# Patient Record
Sex: Female | Born: 1935 | Race: White | Hispanic: No | Marital: Married | Smoking: Former smoker
Health system: Southern US, Community
[De-identification: ages and names within clinical notes are randomized; demographics above are authoritative.]

## PROBLEM LIST (undated history)

## (undated) DIAGNOSIS — F329 Major depressive disorder, single episode, unspecified: Secondary | ICD-10-CM

## (undated) DIAGNOSIS — I10 Essential (primary) hypertension: Secondary | ICD-10-CM

## (undated) DIAGNOSIS — F32A Depression, unspecified: Secondary | ICD-10-CM

## (undated) DIAGNOSIS — E871 Hypo-osmolality and hyponatremia: Secondary | ICD-10-CM

## (undated) DIAGNOSIS — Z87440 Personal history of urinary (tract) infections: Secondary | ICD-10-CM

## (undated) DIAGNOSIS — C4491 Basal cell carcinoma of skin, unspecified: Secondary | ICD-10-CM

## (undated) DIAGNOSIS — S62009A Unspecified fracture of navicular [scaphoid] bone of unspecified wrist, initial encounter for closed fracture: Secondary | ICD-10-CM

## (undated) DIAGNOSIS — K5790 Diverticulosis of intestine, part unspecified, without perforation or abscess without bleeding: Secondary | ICD-10-CM

## (undated) DIAGNOSIS — IMO0002 Reserved for concepts with insufficient information to code with codable children: Secondary | ICD-10-CM

## (undated) DIAGNOSIS — I4891 Unspecified atrial fibrillation: Secondary | ICD-10-CM

## (undated) DIAGNOSIS — I499 Cardiac arrhythmia, unspecified: Secondary | ICD-10-CM

## (undated) DIAGNOSIS — E041 Nontoxic single thyroid nodule: Secondary | ICD-10-CM

## (undated) DIAGNOSIS — L719 Rosacea, unspecified: Secondary | ICD-10-CM

## (undated) DIAGNOSIS — F0280 Dementia in other diseases classified elsewhere without behavioral disturbance: Secondary | ICD-10-CM

## (undated) DIAGNOSIS — G309 Alzheimer's disease, unspecified: Secondary | ICD-10-CM

## (undated) DIAGNOSIS — M8580 Other specified disorders of bone density and structure, unspecified site: Secondary | ICD-10-CM

## (undated) DIAGNOSIS — H269 Unspecified cataract: Secondary | ICD-10-CM

## (undated) DIAGNOSIS — L570 Actinic keratosis: Secondary | ICD-10-CM

## (undated) DIAGNOSIS — Z7901 Long term (current) use of anticoagulants: Secondary | ICD-10-CM

## (undated) DIAGNOSIS — E785 Hyperlipidemia, unspecified: Secondary | ICD-10-CM

## (undated) DIAGNOSIS — T7840XA Allergy, unspecified, initial encounter: Secondary | ICD-10-CM

## (undated) DIAGNOSIS — G3184 Mild cognitive impairment, so stated: Secondary | ICD-10-CM

## (undated) DIAGNOSIS — F419 Anxiety disorder, unspecified: Secondary | ICD-10-CM

## (undated) HISTORY — DX: Anxiety disorder, unspecified: F41.9

## (undated) HISTORY — DX: Long term (current) use of anticoagulants: Z79.01

## (undated) HISTORY — DX: Hypo-osmolality and hyponatremia: E87.1

## (undated) HISTORY — DX: Actinic keratosis: L57.0

## (undated) HISTORY — DX: Unspecified atrial fibrillation: I48.91

## (undated) HISTORY — DX: Unspecified cataract: H26.9

## (undated) HISTORY — DX: Cardiac arrhythmia, unspecified: I49.9

## (undated) HISTORY — DX: Diverticulosis of intestine, part unspecified, without perforation or abscess without bleeding: K57.90

## (undated) HISTORY — DX: Depression, unspecified: F32.A

## (undated) HISTORY — DX: Alzheimer's disease, unspecified: G30.9

## (undated) HISTORY — DX: Unspecified fracture of navicular (scaphoid) bone of unspecified wrist, initial encounter for closed fracture: S62.009A

## (undated) HISTORY — DX: Reserved for concepts with insufficient information to code with codable children: IMO0002

## (undated) HISTORY — DX: Basal cell carcinoma of skin, unspecified: C44.91

## (undated) HISTORY — DX: Rosacea, unspecified: L71.9

## (undated) HISTORY — DX: Allergy, unspecified, initial encounter: T78.40XA

## (undated) HISTORY — DX: Mild cognitive impairment of uncertain or unknown etiology: G31.84

## (undated) HISTORY — DX: Personal history of urinary (tract) infections: Z87.440

## (undated) HISTORY — DX: Dementia in other diseases classified elsewhere without behavioral disturbance: F02.80

## (undated) HISTORY — DX: Nontoxic single thyroid nodule: E04.1

## (undated) HISTORY — PX: OTHER SURGICAL HISTORY: SHX169

## (undated) HISTORY — DX: Other specified disorders of bone density and structure, unspecified site: M85.80

## (undated) HISTORY — DX: Major depressive disorder, single episode, unspecified: F32.9

## (undated) HISTORY — DX: Hyperlipidemia, unspecified: E78.5

---

## 1968-11-21 HISTORY — PX: ABDOMINAL HYSTERECTOMY: SHX81

## 1999-07-21 ENCOUNTER — Ambulatory Visit (HOSPITAL_COMMUNITY): Admission: RE | Admit: 1999-07-21 | Discharge: 1999-07-21 | Payer: Self-pay | Admitting: *Deleted

## 2001-07-21 ENCOUNTER — Emergency Department (HOSPITAL_COMMUNITY): Admission: EM | Admit: 2001-07-21 | Discharge: 2001-07-21 | Payer: Self-pay | Admitting: Emergency Medicine

## 2001-10-16 ENCOUNTER — Ambulatory Visit (HOSPITAL_COMMUNITY): Admission: RE | Admit: 2001-10-16 | Discharge: 2001-10-16 | Payer: Self-pay | Admitting: *Deleted

## 2002-12-05 ENCOUNTER — Encounter: Admission: RE | Admit: 2002-12-05 | Discharge: 2002-12-05 | Payer: Self-pay | Admitting: Internal Medicine

## 2002-12-05 ENCOUNTER — Encounter: Payer: Self-pay | Admitting: Internal Medicine

## 2003-09-18 ENCOUNTER — Encounter: Admission: RE | Admit: 2003-09-18 | Discharge: 2003-09-18 | Payer: Self-pay | Admitting: *Deleted

## 2003-12-18 ENCOUNTER — Ambulatory Visit (HOSPITAL_COMMUNITY): Admission: RE | Admit: 2003-12-18 | Discharge: 2003-12-18 | Payer: Self-pay | Admitting: Internal Medicine

## 2004-08-05 DIAGNOSIS — S62009A Unspecified fracture of navicular [scaphoid] bone of unspecified wrist, initial encounter for closed fracture: Secondary | ICD-10-CM

## 2004-08-05 HISTORY — DX: Unspecified fracture of navicular (scaphoid) bone of unspecified wrist, initial encounter for closed fracture: S62.009A

## 2004-09-01 ENCOUNTER — Encounter: Admission: RE | Admit: 2004-09-01 | Discharge: 2004-09-01 | Payer: Self-pay | Admitting: Orthopedic Surgery

## 2004-09-08 ENCOUNTER — Encounter: Admission: RE | Admit: 2004-09-08 | Discharge: 2004-09-08 | Payer: Self-pay | Admitting: Orthopedic Surgery

## 2005-03-22 ENCOUNTER — Ambulatory Visit (HOSPITAL_COMMUNITY): Admission: RE | Admit: 2005-03-22 | Discharge: 2005-03-22 | Payer: Self-pay | Admitting: Internal Medicine

## 2005-06-29 ENCOUNTER — Ambulatory Visit: Payer: Self-pay | Admitting: Gastroenterology

## 2005-07-07 ENCOUNTER — Ambulatory Visit: Payer: Self-pay | Admitting: Gastroenterology

## 2006-05-02 ENCOUNTER — Ambulatory Visit (HOSPITAL_COMMUNITY): Admission: RE | Admit: 2006-05-02 | Discharge: 2006-05-02 | Payer: Self-pay | Admitting: Internal Medicine

## 2006-11-21 DIAGNOSIS — K5790 Diverticulosis of intestine, part unspecified, without perforation or abscess without bleeding: Secondary | ICD-10-CM

## 2006-11-21 HISTORY — DX: Diverticulosis of intestine, part unspecified, without perforation or abscess without bleeding: K57.90

## 2007-07-12 ENCOUNTER — Ambulatory Visit (HOSPITAL_COMMUNITY): Admission: RE | Admit: 2007-07-12 | Discharge: 2007-07-12 | Payer: Self-pay | Admitting: *Deleted

## 2007-11-21 ENCOUNTER — Encounter: Admission: RE | Admit: 2007-11-21 | Discharge: 2007-11-21 | Payer: Self-pay | Admitting: Family Medicine

## 2008-09-11 ENCOUNTER — Ambulatory Visit (HOSPITAL_COMMUNITY): Admission: RE | Admit: 2008-09-11 | Discharge: 2008-09-11 | Payer: Self-pay | Admitting: *Deleted

## 2010-02-02 ENCOUNTER — Ambulatory Visit (HOSPITAL_COMMUNITY): Admission: RE | Admit: 2010-02-02 | Discharge: 2010-02-02 | Payer: Self-pay | Admitting: *Deleted

## 2010-03-02 ENCOUNTER — Emergency Department (HOSPITAL_COMMUNITY): Admission: EM | Admit: 2010-03-02 | Discharge: 2010-03-03 | Payer: Self-pay | Admitting: Emergency Medicine

## 2010-03-12 ENCOUNTER — Encounter: Admission: RE | Admit: 2010-03-12 | Discharge: 2010-03-12 | Payer: Self-pay | Admitting: Family Medicine

## 2010-05-11 ENCOUNTER — Encounter (INDEPENDENT_AMBULATORY_CARE_PROVIDER_SITE_OTHER): Payer: Self-pay | Admitting: *Deleted

## 2010-11-05 ENCOUNTER — Encounter
Admission: RE | Admit: 2010-11-05 | Discharge: 2010-11-05 | Payer: Self-pay | Source: Home / Self Care | Attending: Family Medicine | Admitting: Family Medicine

## 2010-11-08 ENCOUNTER — Encounter
Admission: RE | Admit: 2010-11-08 | Discharge: 2010-11-08 | Payer: Self-pay | Source: Home / Self Care | Attending: Family Medicine | Admitting: Family Medicine

## 2010-11-26 ENCOUNTER — Encounter
Admission: RE | Admit: 2010-11-26 | Discharge: 2010-11-26 | Payer: Self-pay | Source: Home / Self Care | Attending: Family Medicine | Admitting: Family Medicine

## 2010-12-12 ENCOUNTER — Encounter: Payer: Self-pay | Admitting: Family Medicine

## 2010-12-16 ENCOUNTER — Other Ambulatory Visit: Payer: Self-pay | Admitting: Internal Medicine

## 2010-12-16 ENCOUNTER — Encounter
Admission: RE | Admit: 2010-12-16 | Discharge: 2010-12-16 | Payer: Self-pay | Source: Home / Self Care | Attending: Internal Medicine | Admitting: Internal Medicine

## 2010-12-16 DIAGNOSIS — R413 Other amnesia: Secondary | ICD-10-CM

## 2010-12-21 NOTE — Letter (Signed)
Summary: Colonoscopy-Changed to Office Visit Letter  [facility]  [address] [place], [place] [address]   Phone: [phone]  Fax: [phone]      May 11, 2010 MRN: [id number]   [facility] [address] [place], [place]  [address]   Dear Ms. Pacetti,   According to our records, it is time for you to schedule a Colonoscopy. However, after reviewing your medical record, I feel that an office visit would be most appropriate to more completely evaluate you and determine your need for a repeat procedure.  Please call [phone] (option #2) at your convenience to schedule an office visit. If you have any questions, concerns, or feel that this letter is in error, we would appreciate your call.   Sincerely,  Hedwig Morton. Juanda Chance, M.D.  [facility] [phone]

## 2010-12-31 ENCOUNTER — Ambulatory Visit
Admission: RE | Admit: 2010-12-31 | Discharge: 2010-12-31 | Disposition: A | Discharge status: Home / Self Care | Payer: Medicare Other | Source: Ambulatory Visit | Attending: Internal Medicine | Admitting: Internal Medicine

## 2010-12-31 DIAGNOSIS — R413 Other amnesia: Secondary | ICD-10-CM

## 2010-12-31 MED ORDER — GADOBENATE DIMEGLUMINE 529 MG/ML IV SOLN
12.0000 mL | Freq: Once | INTRAVENOUS | Status: AC | PRN
  Administered 2010-12-31: 12 mL via INTRAVENOUS

## 2011-02-09 LAB — DIFFERENTIAL
Basophils Absolute: 0.1 10*3/uL (ref 0.0–0.1)
Eosinophils Absolute: 0 10*3/uL (ref 0.0–0.7)
Eosinophils Relative: 1 % (ref 0–5)
Lymphocytes Relative: 21 % (ref 12–46)
Lymphs Abs: 2 10*3/uL (ref 0.7–4.0)
Monocytes Absolute: 0.5 10*3/uL (ref 0.1–1.0)

## 2011-02-09 LAB — COMPREHENSIVE METABOLIC PANEL
ALT: 21 U/L (ref 0–35)
AST: 27 U/L (ref 0–37)
Albumin: 4 g/dL (ref 3.5–5.2)
CO2: 28 mEq/L (ref 19–32)
Chloride: 97 mEq/L (ref 96–112)
Creatinine, Ser: 0.83 mg/dL (ref 0.4–1.2)
GFR calc Af Amer: >60 mL/min (ref >60)
GFR calc non Af Amer: >60 mL/min (ref >60)
Sodium: 131 mEq/L — ABNORMAL LOW (ref 135–145)
Total Bilirubin: 0.8 mg/dL (ref 0.3–1.2)

## 2011-02-09 LAB — URINALYSIS, ROUTINE W REFLEX MICROSCOPIC
Nitrite: NEGATIVE
Protein, ur: NEGATIVE mg/dL
Specific Gravity, Urine: 1.009 (ref 1.005–1.030)
Urobilinogen, UA: 0.2 mg/dL (ref 0.0–1.0)

## 2011-02-09 LAB — CBC
MCV: 93.3 fL (ref 78.0–100.0)
Platelets: 230 10*3/uL (ref 150–400)
RBC: 4.5 MIL/uL (ref 3.87–5.11)
WBC: 9.4 10*3/uL (ref 4.0–10.5)

## 2011-03-30 ENCOUNTER — Other Ambulatory Visit: Payer: Self-pay | Admitting: Internal Medicine

## 2011-03-30 DIAGNOSIS — E042 Nontoxic multinodular goiter: Secondary | ICD-10-CM

## 2011-03-31 ENCOUNTER — Other Ambulatory Visit: Payer: Self-pay | Admitting: Ophthalmology

## 2011-03-31 ENCOUNTER — Ambulatory Visit
Admission: RE | Admit: 2011-03-31 | Discharge: 2011-03-31 | Disposition: A | Discharge status: Home / Self Care | Payer: Medicare Other | Source: Ambulatory Visit | Attending: Ophthalmology | Admitting: Ophthalmology

## 2011-03-31 DIAGNOSIS — H532 Diplopia: Secondary | ICD-10-CM

## 2011-03-31 MED ORDER — IOHEXOL 300 MG/ML  SOLN
75.0000 mL | Freq: Once | INTRAMUSCULAR | Status: AC | PRN
  Administered 2011-03-31: 75 mL via INTRAVENOUS

## 2011-04-01 ENCOUNTER — Ambulatory Visit
Admission: RE | Admit: 2011-04-01 | Discharge: 2011-04-01 | Disposition: A | Discharge status: Home / Self Care | Payer: Medicare Other | Source: Ambulatory Visit | Attending: Internal Medicine | Admitting: Internal Medicine

## 2011-04-01 DIAGNOSIS — E042 Nontoxic multinodular goiter: Secondary | ICD-10-CM

## 2011-05-12 ENCOUNTER — Other Ambulatory Visit (HOSPITAL_COMMUNITY): Payer: Self-pay | Admitting: Family Medicine

## 2011-05-12 DIAGNOSIS — Z1231 Encounter for screening mammogram for malignant neoplasm of breast: Secondary | ICD-10-CM

## 2011-05-13 ENCOUNTER — Ambulatory Visit (HOSPITAL_COMMUNITY): Payer: Medicare Other

## 2011-05-23 ENCOUNTER — Ambulatory Visit (HOSPITAL_COMMUNITY): Payer: Medicare Other

## 2011-06-16 ENCOUNTER — Ambulatory Visit (HOSPITAL_COMMUNITY)
Admission: RE | Admit: 2011-06-16 | Discharge: 2011-06-16 | Disposition: A | Discharge status: Home / Self Care | Payer: Medicare Other | Source: Ambulatory Visit | Attending: Family Medicine | Admitting: Family Medicine

## 2011-06-16 DIAGNOSIS — Z1231 Encounter for screening mammogram for malignant neoplasm of breast: Secondary | ICD-10-CM | POA: Insufficient documentation

## 2011-12-02 DIAGNOSIS — L821 Other seborrheic keratosis: Secondary | ICD-10-CM | POA: Diagnosis not present

## 2011-12-02 DIAGNOSIS — D239 Other benign neoplasm of skin, unspecified: Secondary | ICD-10-CM | POA: Diagnosis not present

## 2011-12-02 DIAGNOSIS — L910 Hypertrophic scar: Secondary | ICD-10-CM | POA: Diagnosis not present

## 2011-12-23 DIAGNOSIS — H2640 Unspecified secondary cataract: Secondary | ICD-10-CM | POA: Diagnosis not present

## 2011-12-23 DIAGNOSIS — H52209 Unspecified astigmatism, unspecified eye: Secondary | ICD-10-CM | POA: Diagnosis not present

## 2011-12-30 DIAGNOSIS — N390 Urinary tract infection, site not specified: Secondary | ICD-10-CM | POA: Diagnosis not present

## 2011-12-30 DIAGNOSIS — R350 Frequency of micturition: Secondary | ICD-10-CM | POA: Diagnosis not present

## 2011-12-30 DIAGNOSIS — E871 Hypo-osmolality and hyponatremia: Secondary | ICD-10-CM | POA: Diagnosis not present

## 2012-01-20 DIAGNOSIS — Q828 Other specified congenital malformations of skin: Secondary | ICD-10-CM | POA: Diagnosis not present

## 2012-02-02 DIAGNOSIS — H26499 Other secondary cataract, unspecified eye: Secondary | ICD-10-CM | POA: Diagnosis not present

## 2012-02-02 DIAGNOSIS — H2640 Unspecified secondary cataract: Secondary | ICD-10-CM | POA: Diagnosis not present

## 2012-02-21 DIAGNOSIS — H02059 Trichiasis without entropian unspecified eye, unspecified eyelid: Secondary | ICD-10-CM | POA: Diagnosis not present

## 2012-02-23 DIAGNOSIS — H26499 Other secondary cataract, unspecified eye: Secondary | ICD-10-CM | POA: Diagnosis not present

## 2012-02-23 DIAGNOSIS — H2640 Unspecified secondary cataract: Secondary | ICD-10-CM | POA: Diagnosis not present

## 2012-03-28 DIAGNOSIS — E871 Hypo-osmolality and hyponatremia: Secondary | ICD-10-CM | POA: Diagnosis not present

## 2012-03-28 DIAGNOSIS — N390 Urinary tract infection, site not specified: Secondary | ICD-10-CM | POA: Diagnosis not present

## 2012-03-28 DIAGNOSIS — R5381 Other malaise: Secondary | ICD-10-CM | POA: Diagnosis not present

## 2012-03-28 DIAGNOSIS — E041 Nontoxic single thyroid nodule: Secondary | ICD-10-CM | POA: Diagnosis not present

## 2012-03-28 DIAGNOSIS — R350 Frequency of micturition: Secondary | ICD-10-CM | POA: Diagnosis not present

## 2012-03-28 DIAGNOSIS — R5383 Other fatigue: Secondary | ICD-10-CM | POA: Diagnosis not present

## 2012-04-13 ENCOUNTER — Other Ambulatory Visit: Payer: Self-pay | Admitting: Internal Medicine

## 2012-04-13 DIAGNOSIS — E049 Nontoxic goiter, unspecified: Secondary | ICD-10-CM

## 2012-04-23 ENCOUNTER — Ambulatory Visit
Admission: RE | Admit: 2012-04-23 | Discharge: 2012-04-23 | Disposition: A | Discharge status: Home / Self Care | Payer: Medicare Other | Source: Ambulatory Visit | Attending: Internal Medicine | Admitting: Internal Medicine

## 2012-04-23 DIAGNOSIS — E042 Nontoxic multinodular goiter: Secondary | ICD-10-CM | POA: Diagnosis not present

## 2012-04-23 DIAGNOSIS — E049 Nontoxic goiter, unspecified: Secondary | ICD-10-CM

## 2012-04-26 DIAGNOSIS — R1310 Dysphagia, unspecified: Secondary | ICD-10-CM | POA: Diagnosis not present

## 2012-04-26 DIAGNOSIS — Z8349 Family history of other endocrine, nutritional and metabolic diseases: Secondary | ICD-10-CM | POA: Diagnosis not present

## 2012-04-26 DIAGNOSIS — E042 Nontoxic multinodular goiter: Secondary | ICD-10-CM | POA: Diagnosis not present

## 2012-04-26 DIAGNOSIS — E871 Hypo-osmolality and hyponatremia: Secondary | ICD-10-CM | POA: Diagnosis not present

## 2012-04-26 DIAGNOSIS — R6889 Other general symptoms and signs: Secondary | ICD-10-CM | POA: Diagnosis not present

## 2012-05-11 DIAGNOSIS — Q828 Other specified congenital malformations of skin: Secondary | ICD-10-CM | POA: Diagnosis not present

## 2012-06-04 ENCOUNTER — Other Ambulatory Visit (HOSPITAL_COMMUNITY): Payer: Self-pay | Admitting: Family Medicine

## 2012-06-04 DIAGNOSIS — Z1231 Encounter for screening mammogram for malignant neoplasm of breast: Secondary | ICD-10-CM

## 2012-06-06 ENCOUNTER — Ambulatory Visit (HOSPITAL_COMMUNITY)
Admission: RE | Admit: 2012-06-06 | Discharge: 2012-06-06 | Disposition: A | Discharge status: Home / Self Care | Payer: Medicare Other | Source: Ambulatory Visit | Attending: Family Medicine | Admitting: Family Medicine

## 2012-06-06 DIAGNOSIS — Z1231 Encounter for screening mammogram for malignant neoplasm of breast: Secondary | ICD-10-CM | POA: Insufficient documentation

## 2012-09-07 DIAGNOSIS — Z23 Encounter for immunization: Secondary | ICD-10-CM | POA: Diagnosis not present

## 2012-10-13 DIAGNOSIS — J209 Acute bronchitis, unspecified: Secondary | ICD-10-CM | POA: Diagnosis not present

## 2012-12-14 DIAGNOSIS — Z85828 Personal history of other malignant neoplasm of skin: Secondary | ICD-10-CM | POA: Diagnosis not present

## 2012-12-14 DIAGNOSIS — C44621 Squamous cell carcinoma of skin of unspecified upper limb, including shoulder: Secondary | ICD-10-CM | POA: Diagnosis not present

## 2012-12-14 DIAGNOSIS — H61009 Unspecified perichondritis of external ear, unspecified ear: Secondary | ICD-10-CM | POA: Diagnosis not present

## 2012-12-14 DIAGNOSIS — L570 Actinic keratosis: Secondary | ICD-10-CM | POA: Diagnosis not present

## 2012-12-22 DIAGNOSIS — Z7901 Long term (current) use of anticoagulants: Secondary | ICD-10-CM | POA: Insufficient documentation

## 2012-12-22 DIAGNOSIS — I4891 Unspecified atrial fibrillation: Secondary | ICD-10-CM

## 2012-12-22 HISTORY — DX: Unspecified atrial fibrillation: I48.91

## 2012-12-22 HISTORY — DX: Long term (current) use of anticoagulants: Z79.01

## 2013-01-04 ENCOUNTER — Emergency Department (HOSPITAL_COMMUNITY): Payer: Medicare Other

## 2013-01-04 ENCOUNTER — Encounter (HOSPITAL_COMMUNITY): Payer: Self-pay | Admitting: *Deleted

## 2013-01-04 ENCOUNTER — Emergency Department (HOSPITAL_COMMUNITY)
Admission: EM | Admit: 2013-01-04 | Discharge: 2013-01-04 | Disposition: A | Discharge status: Home / Self Care | Payer: Medicare Other | Attending: Emergency Medicine | Admitting: Emergency Medicine

## 2013-01-04 DIAGNOSIS — I480 Paroxysmal atrial fibrillation: Secondary | ICD-10-CM

## 2013-01-04 DIAGNOSIS — I4891 Unspecified atrial fibrillation: Secondary | ICD-10-CM | POA: Diagnosis not present

## 2013-01-04 DIAGNOSIS — I10 Essential (primary) hypertension: Secondary | ICD-10-CM | POA: Insufficient documentation

## 2013-01-04 DIAGNOSIS — Z7982 Long term (current) use of aspirin: Secondary | ICD-10-CM | POA: Diagnosis not present

## 2013-01-04 DIAGNOSIS — Z79899 Other long term (current) drug therapy: Secondary | ICD-10-CM | POA: Diagnosis not present

## 2013-01-04 DIAGNOSIS — I499 Cardiac arrhythmia, unspecified: Secondary | ICD-10-CM | POA: Diagnosis not present

## 2013-01-04 HISTORY — DX: Essential (primary) hypertension: I10

## 2013-01-04 LAB — CBC WITH DIFFERENTIAL/PLATELET
HCT: 41.4 % (ref 36.0–46.0)
Lymphocytes Relative: 37 % (ref 12–46)
Lymphs Abs: 2 10*3/uL (ref 0.7–4.0)
MCHC: 34.3 g/dL (ref 30.0–36.0)
MCV: 88.7 fL (ref 78.0–100.0)
Monocytes Relative: 11 % (ref 3–12)
Neutro Abs: 2.3 10*3/uL (ref 1.7–7.7)
Neutrophils Relative %: 44 % (ref 43–77)
Platelets: 227 10*3/uL (ref 150–400)

## 2013-01-04 LAB — BASIC METABOLIC PANEL
BUN: 15 mg/dL (ref 6–23)
CO2: 26 mEq/L (ref 19–32)
Chloride: 95 mEq/L — ABNORMAL LOW (ref 96–112)
Creatinine, Ser: 0.83 mg/dL (ref 0.50–1.10)

## 2013-01-04 LAB — POCT I-STAT TROPONIN I: Troponin i, poc: 0.01 ng/mL (ref 0.00–0.08)

## 2013-01-04 MED ORDER — APIXABAN 2.5 MG PO TABS: 2.5000 mg | ORAL_TABLET | Freq: Two times a day (BID) | ORAL | Status: DC

## 2013-01-04 MED ORDER — APIXABAN 5 MG PO TABS: 5.0000 mg | ORAL_TABLET | Freq: Two times a day (BID) | ORAL | Status: DC

## 2013-01-04 NOTE — ED Notes (Signed)
Per EMS pt from [facility] with c/o full ness in throat this am, went away on EMS arrival, but pt noted to be in A-Fib. No hx of A-Fib. Reports shortness of breath and diaphoretic. Pt given 4 baby aspirin. 22G L Hand. Denies chest pain.

## 2013-01-04 NOTE — ED Notes (Signed)
MD at bedside. 

## 2013-01-04 NOTE — ED Notes (Signed)
Prior Authorization from insurance was done for patient to receive medication -[id number] per request of Husband POA

## 2013-01-04 NOTE — ED Provider Notes (Signed)
History     CSN: [id number]  Arrival date & time 01/04/13  1308   First MD Initiated Contact with Patient 01/04/13 [phone]      Chief Complaint  Patient presents with  . Atrial Fibrillation    (Consider location/radiation/quality/duration/timing/severity/associated sxs/prior Treatment)  HPI Cathy Dickerson is a 79 y.o. female who presents to the ED for concern of palpitations and pressure in her throat.  She reports that this lasted approximately 1/2 hour.  Non exertional.  Has had spells of this before, but never seen a doctor about them in the past.  No fevers.  Patient now completely asymptomatic.    Past Medical History  Diagnosis Date  . Hypertension     Past Surgical History  Procedure Laterality Date  . Cesarean section    . Cataracts     Family History: Reviewed.  None pertinent.     History  Substance Use Topics  . Smoking status: Never Smoker   . Smokeless tobacco: Not on file  . Alcohol Use: No    OB History   Grav Para Term Preterm Abortions TAB SAB Ect Mult Living                  Review of Systems  Constitutional: Negative for fever and chills.  HENT: Negative for congestion, rhinorrhea, neck pain and neck stiffness.   Respiratory: Negative for cough and shortness of breath.   Cardiovascular: Positive for palpitations. Negative for chest pain.  Gastrointestinal: Negative for nausea, vomiting, abdominal pain, diarrhea and abdominal distention.  Endocrine: Negative for polyuria.  Genitourinary: Negative for dysuria.  Skin: Negative for rash.  Neurological: Negative for headaches.  Psychiatric/Behavioral: Negative.   All other systems reviewed and are negative.    Allergies  Aricept; Ciprofloxacin; Latex; Macrobid; and Tramadol  Home Medications   Current Outpatient Rx  Name  Route  Sig  Dispense  Refill  . aspirin EC 81 MG tablet   Oral   Take 81 mg by mouth daily.         Marland Kitchen CALCIUM PO   Oral   Take 1 tablet by mouth daily.          . enalapril (VASOTEC) 10 MG tablet   Oral   Take 20 mg by mouth daily.         . fish oil-omega-3 fatty acids 1000 MG capsule   Oral   Take 2 g by mouth 2 (two) times daily.         . Multiple Vitamin (MULTIVITAMIN WITH MINERALS) TABS   Oral   Take 1 tablet by mouth daily.           BP 154/67  Pulse 59  Temp(Src) 97.4 F (36.3 C) (Oral)  Resp 16  SpO2 100%  Physical Exam  Nursing note and vitals reviewed. Constitutional: She is oriented to person, place, and time. She appears well-developed and well-nourished. No distress.  HENT:  Head: Normocephalic and atraumatic.  Right Ear: External ear normal.  Left Ear: External ear normal.  Nose: Nose normal.  Mouth/Throat: Oropharynx is clear and moist. No oropharyngeal exudate.  Eyes: EOM are normal. Pupils are equal, round, and reactive to light.  Neck: Normal range of motion. Neck supple. No tracheal deviation present.  Cardiovascular: Normal rate.   Pulmonary/Chest: Effort normal and breath sounds normal. No stridor. No respiratory distress. She has no wheezes. She has no rales.  Abdominal: Soft. She exhibits no distension. There is no tenderness. There is no rebound.  Musculoskeletal: Normal range of motion.  Neurological: She is alert and oriented to person, place, and time.  Skin: Skin is warm and dry. She is not diaphoretic.    ED Course  Procedures (including critical care time)  Labs Reviewed  CBC WITH DIFFERENTIAL - Abnormal; Notable for the following:    Eosinophils Relative 7 (*)    All other components within normal limits  BASIC METABOLIC PANEL  POCT I-STAT TROPONIN I   Dg Chest Portable 1 View  01/04/2013  *RADIOLOGY REPORT*  Clinical Data: Atrial fibrillation.  PORTABLE CHEST - 1 VIEW  Comparison: PA and lateral chest 11/08/2010.  Findings: Small calcified bilateral granulomata are again seen. Lungs otherwise clear.  Heart size normal.  No pneumothorax or pleural effusion.  IMPRESSION: No acute  disease.   Original Report Authenticated By: Holley Dexter, M.D.      No diagnosis found.   Date: 01/04/2013  Rate: 115  Rhythm: atrial fibrillation  QRS Axis: normal  Intervals: normal  ST/T Wave abnormalities: normal  Conduction Disutrbances:none  Narrative Interpretation:   Old EKG Reviewed: none available   Date: 01/04/2013  Rate: 54  Rhythm: normal sinus rhythm  QRS Axis: normal  Intervals: normal  ST/T Wave abnormalities: normal  Conduction Disutrbances:none  Narrative Interpretation:   Old EKG Reviewed: no longer in atrial fibrillation    MDM    Cathy Dickerson is a 79 y.o. female who presented with mild palpitations and tightness feeling.  Initial EKG in atrial fibrillation.  Repeat EKG done 1 hour later in sinus rhythm.  Likely paroxysmal atrial fibrillation.  Spoke with patient's cardiologist.  Cardiologist wanting to see in 3 days time.  Recommending initiation of beta blocker therapy and Eliquis.  Will hold off on beta blocker at this point as pulse in 50s.  Cardiology requesting Eliquis 5mg  BID, but patient requesting half strength because she is very sensitive to medications.  Discussed risks/benefits of this and patient still wanting half strength.  Patient provided prescription for Eliquis 2.5mg  BID.  No history of GI bleeds, hemorrhagic strokes, or falls.  Patient otherwise safe for disposition.  Patient discharged.        Arloa Koh, MD 01/04/13 1714

## 2013-01-05 NOTE — ED Provider Notes (Signed)
I have personally seen and examined the patient.  I have discussed the plan of care with the resident.  I have reviewed the documentation on PMH/FH/Soc. History.  I have reviewed the documentation of the resident and agree.  I have reviewed and agree with the ECG interpretation(s) documented by the resident.  Pt well appearing, no distress.  She would like to be discharged.  Resident spoke to dr Donnie Aho.  F/u arranged.  Answered all questions with patient and husband  Joya Gaskins, MD 01/05/13 [phone]

## 2013-01-10 DIAGNOSIS — I4891 Unspecified atrial fibrillation: Secondary | ICD-10-CM | POA: Diagnosis not present

## 2013-01-10 DIAGNOSIS — Z85828 Personal history of other malignant neoplasm of skin: Secondary | ICD-10-CM | POA: Diagnosis not present

## 2013-01-10 DIAGNOSIS — C44621 Squamous cell carcinoma of skin of unspecified upper limb, including shoulder: Secondary | ICD-10-CM | POA: Diagnosis not present

## 2013-01-18 DIAGNOSIS — H2640 Unspecified secondary cataract: Secondary | ICD-10-CM | POA: Diagnosis not present

## 2013-01-18 DIAGNOSIS — Z961 Presence of intraocular lens: Secondary | ICD-10-CM | POA: Diagnosis not present

## 2013-01-31 DIAGNOSIS — H26499 Other secondary cataract, unspecified eye: Secondary | ICD-10-CM | POA: Diagnosis not present

## 2013-01-31 DIAGNOSIS — H2640 Unspecified secondary cataract: Secondary | ICD-10-CM | POA: Diagnosis not present

## 2013-02-15 DIAGNOSIS — I4891 Unspecified atrial fibrillation: Secondary | ICD-10-CM | POA: Diagnosis not present

## 2013-02-15 DIAGNOSIS — I10 Essential (primary) hypertension: Secondary | ICD-10-CM | POA: Diagnosis not present

## 2013-02-15 DIAGNOSIS — Z7901 Long term (current) use of anticoagulants: Secondary | ICD-10-CM | POA: Diagnosis not present

## 2013-02-18 DIAGNOSIS — N390 Urinary tract infection, site not specified: Secondary | ICD-10-CM | POA: Diagnosis not present

## 2013-04-18 DIAGNOSIS — D239 Other benign neoplasm of skin, unspecified: Secondary | ICD-10-CM | POA: Diagnosis not present

## 2013-04-18 DIAGNOSIS — Z85828 Personal history of other malignant neoplasm of skin: Secondary | ICD-10-CM | POA: Diagnosis not present

## 2013-04-18 DIAGNOSIS — L821 Other seborrheic keratosis: Secondary | ICD-10-CM | POA: Diagnosis not present

## 2013-04-18 DIAGNOSIS — I789 Disease of capillaries, unspecified: Secondary | ICD-10-CM | POA: Diagnosis not present

## 2013-04-18 DIAGNOSIS — D0470 Carcinoma in situ of skin of unspecified lower limb, including hip: Secondary | ICD-10-CM | POA: Diagnosis not present

## 2013-04-18 DIAGNOSIS — L570 Actinic keratosis: Secondary | ICD-10-CM | POA: Diagnosis not present

## 2013-04-24 ENCOUNTER — Other Ambulatory Visit: Payer: Self-pay | Admitting: Internal Medicine

## 2013-04-24 DIAGNOSIS — E049 Nontoxic goiter, unspecified: Secondary | ICD-10-CM

## 2013-04-29 ENCOUNTER — Ambulatory Visit
Admission: RE | Admit: 2013-04-29 | Discharge: 2013-04-29 | Disposition: A | Discharge status: Home / Self Care | Payer: Medicare Other | Source: Ambulatory Visit | Attending: Internal Medicine | Admitting: Internal Medicine

## 2013-04-29 DIAGNOSIS — E042 Nontoxic multinodular goiter: Secondary | ICD-10-CM | POA: Diagnosis not present

## 2013-04-29 DIAGNOSIS — E049 Nontoxic goiter, unspecified: Secondary | ICD-10-CM

## 2013-04-30 DIAGNOSIS — E042 Nontoxic multinodular goiter: Secondary | ICD-10-CM | POA: Diagnosis not present

## 2013-04-30 DIAGNOSIS — N390 Urinary tract infection, site not specified: Secondary | ICD-10-CM | POA: Diagnosis not present

## 2013-05-02 DIAGNOSIS — E042 Nontoxic multinodular goiter: Secondary | ICD-10-CM | POA: Diagnosis not present

## 2013-05-02 DIAGNOSIS — Z8349 Family history of other endocrine, nutritional and metabolic diseases: Secondary | ICD-10-CM | POA: Diagnosis not present

## 2013-05-02 DIAGNOSIS — E871 Hypo-osmolality and hyponatremia: Secondary | ICD-10-CM | POA: Diagnosis not present

## 2013-05-06 DIAGNOSIS — E871 Hypo-osmolality and hyponatremia: Secondary | ICD-10-CM | POA: Diagnosis not present

## 2013-05-06 DIAGNOSIS — J029 Acute pharyngitis, unspecified: Secondary | ICD-10-CM | POA: Diagnosis not present

## 2013-05-06 DIAGNOSIS — F3289 Other specified depressive episodes: Secondary | ICD-10-CM | POA: Diagnosis not present

## 2013-05-06 DIAGNOSIS — F329 Major depressive disorder, single episode, unspecified: Secondary | ICD-10-CM | POA: Diagnosis not present

## 2013-05-08 ENCOUNTER — Other Ambulatory Visit (HOSPITAL_COMMUNITY): Payer: Self-pay | Admitting: Family Medicine

## 2013-05-08 DIAGNOSIS — Z1231 Encounter for screening mammogram for malignant neoplasm of breast: Secondary | ICD-10-CM

## 2013-05-13 DIAGNOSIS — D0470 Carcinoma in situ of skin of unspecified lower limb, including hip: Secondary | ICD-10-CM | POA: Diagnosis not present

## 2013-05-13 DIAGNOSIS — Z85828 Personal history of other malignant neoplasm of skin: Secondary | ICD-10-CM | POA: Diagnosis not present

## 2013-05-13 DIAGNOSIS — L821 Other seborrheic keratosis: Secondary | ICD-10-CM | POA: Diagnosis not present

## 2013-05-14 DIAGNOSIS — E042 Nontoxic multinodular goiter: Secondary | ICD-10-CM | POA: Diagnosis not present

## 2013-05-14 DIAGNOSIS — E871 Hypo-osmolality and hyponatremia: Secondary | ICD-10-CM | POA: Diagnosis not present

## 2013-06-03 DIAGNOSIS — E871 Hypo-osmolality and hyponatremia: Secondary | ICD-10-CM | POA: Diagnosis not present

## 2013-06-14 ENCOUNTER — Ambulatory Visit (HOSPITAL_COMMUNITY)
Admission: RE | Admit: 2013-06-14 | Discharge: 2013-06-14 | Disposition: A | Discharge status: Home / Self Care | Payer: Medicare Other | Source: Ambulatory Visit | Attending: Family Medicine | Admitting: Family Medicine

## 2013-06-14 DIAGNOSIS — Z1231 Encounter for screening mammogram for malignant neoplasm of breast: Secondary | ICD-10-CM | POA: Diagnosis not present

## 2013-07-02 DIAGNOSIS — E871 Hypo-osmolality and hyponatremia: Secondary | ICD-10-CM | POA: Diagnosis not present

## 2013-07-18 DIAGNOSIS — R350 Frequency of micturition: Secondary | ICD-10-CM | POA: Diagnosis not present

## 2013-07-18 DIAGNOSIS — E871 Hypo-osmolality and hyponatremia: Secondary | ICD-10-CM | POA: Diagnosis not present

## 2013-07-18 DIAGNOSIS — R609 Edema, unspecified: Secondary | ICD-10-CM | POA: Diagnosis not present

## 2013-07-18 DIAGNOSIS — E042 Nontoxic multinodular goiter: Secondary | ICD-10-CM | POA: Diagnosis not present

## 2013-07-18 DIAGNOSIS — Z7901 Long term (current) use of anticoagulants: Secondary | ICD-10-CM | POA: Diagnosis not present

## 2013-08-16 DIAGNOSIS — I10 Essential (primary) hypertension: Secondary | ICD-10-CM | POA: Diagnosis not present

## 2013-08-16 DIAGNOSIS — I4891 Unspecified atrial fibrillation: Secondary | ICD-10-CM | POA: Diagnosis not present

## 2013-08-16 DIAGNOSIS — Z7901 Long term (current) use of anticoagulants: Secondary | ICD-10-CM | POA: Diagnosis not present

## 2013-09-12 DIAGNOSIS — L821 Other seborrheic keratosis: Secondary | ICD-10-CM | POA: Diagnosis not present

## 2013-09-12 DIAGNOSIS — D0470 Carcinoma in situ of skin of unspecified lower limb, including hip: Secondary | ICD-10-CM | POA: Diagnosis not present

## 2013-09-12 DIAGNOSIS — Z85828 Personal history of other malignant neoplasm of skin: Secondary | ICD-10-CM | POA: Diagnosis not present

## 2013-09-12 DIAGNOSIS — L570 Actinic keratosis: Secondary | ICD-10-CM | POA: Diagnosis not present

## 2013-11-07 ENCOUNTER — Encounter: Payer: Self-pay | Admitting: Geriatric Medicine

## 2013-11-07 ENCOUNTER — Non-Acute Institutional Stay (SKILLED_NURSING_FACILITY): Payer: Medicare Other | Admitting: Geriatric Medicine

## 2013-11-07 DIAGNOSIS — I4891 Unspecified atrial fibrillation: Secondary | ICD-10-CM

## 2013-11-07 DIAGNOSIS — G3184 Mild cognitive impairment, so stated: Secondary | ICD-10-CM | POA: Diagnosis not present

## 2013-11-07 DIAGNOSIS — Z7901 Long term (current) use of anticoagulants: Secondary | ICD-10-CM

## 2013-11-07 DIAGNOSIS — I10 Essential (primary) hypertension: Secondary | ICD-10-CM

## 2013-11-07 NOTE — Progress Notes (Signed)
Patient ID: Cathy Dickerson, female   DOB: [birth year], 79 y.o.   MRN: [id number]  [facility] [phone])  Code Status: Full Code Contact Information   Name Relation Home Work Mobile   [place] Spouse [phone]      Chief Complaint  Patient presents with  . tachycardia/bradycardia    HPI: This is a 79 y.o. female resident of [facility], Independent Living  section.  Yesterday, late afternoon, she had a sudden onset of "fluttering" in her throat and chest. She describes this as a very uncomfortable feeling, denies any associated dizziness, chest pain or shortness of breath. The chest fluttering continued for about 30 minutes, her spouse brought her to the clinic area at [facility] where she was evaluated by the nursing supervisor. Patient was found to have an irregular heart rhythm with a rate of 120 beats per minute.  Patient was given 25 mg of metoprolol tartrate. This medicine had been prescribed after her diagnosis of AF 12/2012, to be used as needed. Spouse tells me patient had not used this medication prior to yesterday; "her heart is always slow". Patient's physician was notified who recommended admission to rehabilitation for observation. A urinalysis was also ordered due to patient's complaint of frequency and urgency.  On admission to the rehabilitation unit, patient's heart rate was 75 beats per minute and regular. She had a restful night. This morning her heart rate was 54BPM, an EKG was ordered.  The patient's cardiologist, Dr.Tilley, was contacted. He reported that her heart rate has been in the 50s for some time, did not recommend any medication changes, did not feel he needed to see her today. The patient has had an uneventful day, she's been eating well, no further symptoms of irregular heart rhythm.  Vital signs are stable, she is at her baseline physical status and wishes to return home with her spouse.    Allergies  Allergen Reactions  . Aricept [Donepezil Hcl] Other (See Comments)    Reaction unknown  . Ciprofloxacin Other (See Comments)    Reaction unknown  . Latex Other (See Comments)    Reaction unknown  . Macrobid [Nitrofurantoin Macrocrystal] Other (See Comments)    Reaction unknown  . Tramadol Other (See Comments)    Reaction unknown      Medication List       This list is accurate as of: 11/07/13  6:00 PM.  Always use your most recent med list.               apixaban 2.5 MG Tabs tablet  Commonly known as:  ELIQUIS  Take 1 tablet (2.5 mg total) by mouth 2 (two) times daily.     CALCIUM PO  Take 1 tablet by mouth daily.     enalapril 10 MG tablet  Commonly known as:  VASOTEC  Take 20 mg by mouth daily.     fish oil-omega-3 fatty acids 1000 MG capsule  Take 2 g by mouth 2 (two) times daily.     loratadine 10 MG tablet  Commonly known as:  CLARITIN  Take 10 mg by mouth daily as needed for allergies.     metoprolol tartrate 25 MG tablet  Commonly known as:  LOPRESSOR  Take 25 mg by mouth as needed (Take 1 tablet if HR 120 or greater. Take 1/2 tablet if HR 110-120).     multivitamin with minerals Tabs tablet  Take 1 tablet by mouth daily.  NASACORT AQ 55 MCG/ACT Aero nasal inhaler  Generic drug:  triamcinolone  Place 2 sprays into the nose as needed.     sodium chloride 1 G tablet  Take 1 g by mouth 2 (two) times daily with a meal.        DATA REVIEWED - office note, Dr.Tilley 08/16/2013  Laboratory Studies; Lab Results  Component Value Date   WBC 5.2 01/04/2013   HGB 14.2 01/04/2013   HCT 41.4 01/04/2013   PLT 227 01/04/2013   GLUCOSE 97 01/04/2013   ALT 21 03/02/2010   AST 27 03/02/2010   NA 129* 01/04/2013   K 3.9 01/04/2013   CL 95* 01/04/2013   CREATININE 0.83 01/04/2013   BUN 15 01/04/2013   CO2 26 01/04/2013   11/06/2013   U/A: yellow, clears, negative nitrites, small leukocyte esterase, no bacteria  Past Medical History  Diagnosis Date   . Hypertension   . Atrial fibrillation 12/2012  . Current use of long term anticoagulation 12/2012    Eliquis  . Mild cognitive impairment   . Cataracts, both eyes 2005, 2013    Cataract extraction/IOL implant  . Diverticulosis 2008  . Hyponatremia     SIADH   Past Surgical History  Procedure Laterality Date  . Cesarean section    . Cataracts Bilateral    Family Status  Relation Status Death Age  . Mother Deceased     Heart disease  . Father Deceased     Heart disease  . Sister Deceased     Heart disease   History   Social History Narrative   Patient is Married Therapist, sports), homemaker. Lives in a single level home, Independent Living section at [facility] since 2013. Lives with spouse, pet dog (Josie)   Stopped smoking prior to 1980, No  Alcohol history   Patient has  no Advanced planning documents.    Review of Systems  DATA OBTAINED: from patient, nurse, medical record, family member (spouse) GENERAL: Feels well   No fevers, fatigue, change in appetite or weight SKIN: No itch, rash or open wounds EYES: No eye pain, dryness or itching  No change in vision EARS: No earache, tinnitus, change in hearing NOSE: No congestion, drainage or bleeding MOUTH/THROAT: No mouth or tooth pain   No sore throat   No difficulty chewing or swallowing RESPIRATORY: No cough, wheezing, SOB CARDIAC: No chest pain, palpitations  No edema. GI: No abdominal pain  No N/V/D or constipation  No heartburn or reflux  GU: No dysuria, No further  frequency or urgency  No change in urine volume or character  MUSCULOSKELETAL: No joint pain, swelling or stiffness  No back pain  No muscle ache, pain, weakness  Gait is steady  No recent falls.  NEUROLOGIC: No dizziness, fainting, headache, imbalance, numbness  No change in mental status( acknowledges mild cognitive impairment).  PSYCHIATRIC: No feelings of anxiety, depression   Sleeps well.  No behavior issue.    Physical Exam Filed  Vitals:   11/07/13 1728  BP: 123/66  Pulse: 51  Temp: 96.9 F (36.1 C)  Resp: 16  Height: 5\' 3"  (1.6 m)  SpO2: 98%   There is no weight on file to calculate BMI.  GENERAL APPEARANCE: No acute distress, appropriately groomed, Thin body habitus. Alert, pleasant, conversant. SKIN: No diaphoresis, rash, unusual lesions, wounds HEAD: Normocephalic, atraumatic EYES: Conjunctiva/lids clear. Pupils round, reactive. EOMs intact.  EARS: External exam WNL, . Hearing grossly normal. NOSE: No deformity or discharge. MOUTH/THROAT:  Lips w/o lesions. Oral mucosa, tongue moist, w/o lesion. Oropharynx w/o redness or lesions.  NECK: Supple, full ROM. No thyroid tenderness, enlargement or nodule LYMPHATICS: No head, neck or supraclavicular adenopathy RESPIRATORY: Breathing is even, unlabored. Lung sounds are clear and full.  CARDIOVASCULAR: Heart RRR, bradycardic 51BPM. No murmur or extra heart sounds  ARTERIAL: No carotid bruit.   VENOUS: No varicosities. No venous stasis skin changes  EDEMA: No peripheral edema.  GASTROINTESTINAL: Abdomen is soft, non-tender, not distended w/ normal bowel sounds. No hepatic or splenic enlargement. No mass, ventral or inguinal hernia. GENITOURINARY: Bladder non tender, not distended. MUSCULOSKELETAL: Moves all extremities with full ROM, strength and tone. Back is without kyphosis, scoliosis or spinal process tenderness. Gait is steady NEUROLOGIC: Oriented to time, place, person. Cranial nerves 2-12 grossly intact, speech clear, no tremor.  PSYCHIATRIC: Mood and affect appropriate to situation  ASSESSMENT/PLAN  Hypertension Blood pressure is well controlled on current medication  Atrial fibrillation Diagnosis 2 2014, heart rate has apparently been low since that time. Episode of symptomatic rapid rate 11/06/2013 resolved with one dose of metoprolol. Have provided patient and spouse parameters for use of metoprolol if her heart rate should elevate again. Recommend  followup with Dr. full the next couple weeks to be sure she agrees with this plan. Patient is anticoagulated with Eliquis  Current use of long term anticoagulation Anticoagulation with Eliquis for stroke risk reduction related to atrial fibrillation. Patient is taking this medication as prescribed, no bleeding complications evident  Mild cognitive impairment Patient acknowledges some difficulties with her memory, spouse is supportive, manages her medications, keeps track of appointments, etc.  Does not appear to be any safety issues, patient will discharge to her independent living home today with her spouse.  Time: , >50% spent counseling/or care coordination   Follow up: With Dr. Jillyn Hidden in 2 weeks  Bane Hagy T.Omaya Nieland, NP-C 11/07/2013

## 2013-11-08 DIAGNOSIS — I10 Essential (primary) hypertension: Secondary | ICD-10-CM | POA: Insufficient documentation

## 2013-11-08 DIAGNOSIS — G3184 Mild cognitive impairment, so stated: Secondary | ICD-10-CM | POA: Insufficient documentation

## 2013-11-08 NOTE — Assessment & Plan Note (Signed)
Anticoagulation with Eliquis for stroke risk reduction related to atrial fibrillation. Patient is taking this medication as prescribed, no bleeding complications evident

## 2013-11-08 NOTE — Assessment & Plan Note (Signed)
Diagnosis 2 2014, heart rate has apparently been low since that time. Episode of symptomatic rapid rate 11/06/2013 resolved with one dose of metoprolol. Have provided patient and spouse parameters for use of metoprolol if her heart rate should elevate again. Recommend followup with Dr. full the next couple weeks to be sure she agrees with this plan. Patient is anticoagulated with Eliquis

## 2013-11-08 NOTE — Assessment & Plan Note (Signed)
Blood pressure is well controlled on current medication.

## 2013-11-08 NOTE — Assessment & Plan Note (Signed)
Patient acknowledges some difficulties with her memory, spouse is supportive, manages her medications, keeps track of appointments, etc.  Does not appear to be any safety issues, patient will discharge to her independent living home today with her spouse.

## 2013-12-20 ENCOUNTER — Other Ambulatory Visit: Payer: Self-pay | Admitting: Family Medicine

## 2013-12-20 DIAGNOSIS — I10 Essential (primary) hypertension: Secondary | ICD-10-CM | POA: Diagnosis not present

## 2013-12-20 DIAGNOSIS — E871 Hypo-osmolality and hyponatremia: Secondary | ICD-10-CM | POA: Diagnosis not present

## 2013-12-20 DIAGNOSIS — D509 Iron deficiency anemia, unspecified: Secondary | ICD-10-CM | POA: Diagnosis not present

## 2013-12-20 DIAGNOSIS — E041 Nontoxic single thyroid nodule: Secondary | ICD-10-CM

## 2013-12-20 DIAGNOSIS — E042 Nontoxic multinodular goiter: Secondary | ICD-10-CM | POA: Diagnosis not present

## 2013-12-20 DIAGNOSIS — I4891 Unspecified atrial fibrillation: Secondary | ICD-10-CM | POA: Diagnosis not present

## 2013-12-20 DIAGNOSIS — R61 Generalized hyperhidrosis: Secondary | ICD-10-CM | POA: Diagnosis not present

## 2013-12-20 DIAGNOSIS — Z7901 Long term (current) use of anticoagulants: Secondary | ICD-10-CM | POA: Diagnosis not present

## 2013-12-20 DIAGNOSIS — N390 Urinary tract infection, site not specified: Secondary | ICD-10-CM | POA: Diagnosis not present

## 2013-12-24 ENCOUNTER — Ambulatory Visit
Admission: RE | Admit: 2013-12-24 | Discharge: 2013-12-24 | Disposition: A | Discharge status: Home / Self Care | Payer: Medicare Other | Source: Ambulatory Visit | Attending: Family Medicine | Admitting: Family Medicine

## 2013-12-24 DIAGNOSIS — E041 Nontoxic single thyroid nodule: Secondary | ICD-10-CM | POA: Diagnosis not present

## 2014-01-03 DIAGNOSIS — D497 Neoplasm of unspecified behavior of endocrine glands and other parts of nervous system: Secondary | ICD-10-CM | POA: Diagnosis not present

## 2014-01-27 DIAGNOSIS — Z85828 Personal history of other malignant neoplasm of skin: Secondary | ICD-10-CM | POA: Diagnosis not present

## 2014-01-27 DIAGNOSIS — L820 Inflamed seborrheic keratosis: Secondary | ICD-10-CM | POA: Diagnosis not present

## 2014-01-27 DIAGNOSIS — D485 Neoplasm of uncertain behavior of skin: Secondary | ICD-10-CM | POA: Diagnosis not present

## 2014-01-27 DIAGNOSIS — L570 Actinic keratosis: Secondary | ICD-10-CM | POA: Diagnosis not present

## 2014-02-18 DIAGNOSIS — Z961 Presence of intraocular lens: Secondary | ICD-10-CM | POA: Diagnosis not present

## 2014-02-18 DIAGNOSIS — H52209 Unspecified astigmatism, unspecified eye: Secondary | ICD-10-CM | POA: Diagnosis not present

## 2014-02-28 DIAGNOSIS — J019 Acute sinusitis, unspecified: Secondary | ICD-10-CM | POA: Diagnosis not present

## 2014-02-28 DIAGNOSIS — R82998 Other abnormal findings in urine: Secondary | ICD-10-CM | POA: Diagnosis not present

## 2014-02-28 DIAGNOSIS — R599 Enlarged lymph nodes, unspecified: Secondary | ICD-10-CM | POA: Diagnosis not present

## 2014-02-28 DIAGNOSIS — R350 Frequency of micturition: Secondary | ICD-10-CM | POA: Diagnosis not present

## 2014-02-28 DIAGNOSIS — E041 Nontoxic single thyroid nodule: Secondary | ICD-10-CM | POA: Diagnosis not present

## 2014-04-01 DIAGNOSIS — R82998 Other abnormal findings in urine: Secondary | ICD-10-CM | POA: Diagnosis not present

## 2014-04-01 DIAGNOSIS — N390 Urinary tract infection, site not specified: Secondary | ICD-10-CM | POA: Diagnosis not present

## 2014-04-01 DIAGNOSIS — R300 Dysuria: Secondary | ICD-10-CM | POA: Diagnosis not present

## 2014-04-01 DIAGNOSIS — E871 Hypo-osmolality and hyponatremia: Secondary | ICD-10-CM | POA: Diagnosis not present

## 2014-04-06 ENCOUNTER — Emergency Department (HOSPITAL_COMMUNITY)
Admission: EM | Admit: 2014-04-06 | Discharge: 2014-04-06 | Disposition: A | Discharge status: Home / Self Care | Payer: Medicare Other | Attending: Emergency Medicine | Admitting: Emergency Medicine

## 2014-04-06 ENCOUNTER — Encounter (HOSPITAL_COMMUNITY): Payer: Self-pay | Admitting: Emergency Medicine

## 2014-04-06 DIAGNOSIS — H5710 Ocular pain, unspecified eye: Secondary | ICD-10-CM | POA: Diagnosis not present

## 2014-04-06 DIAGNOSIS — I4891 Unspecified atrial fibrillation: Secondary | ICD-10-CM | POA: Insufficient documentation

## 2014-04-06 DIAGNOSIS — Z862 Personal history of diseases of the blood and blood-forming organs and certain disorders involving the immune mechanism: Secondary | ICD-10-CM | POA: Insufficient documentation

## 2014-04-06 DIAGNOSIS — Z91040 Latex allergy status: Secondary | ICD-10-CM | POA: Diagnosis not present

## 2014-04-06 DIAGNOSIS — Z8719 Personal history of other diseases of the digestive system: Secondary | ICD-10-CM | POA: Insufficient documentation

## 2014-04-06 DIAGNOSIS — I10 Essential (primary) hypertension: Secondary | ICD-10-CM | POA: Insufficient documentation

## 2014-04-06 DIAGNOSIS — Z79899 Other long term (current) drug therapy: Secondary | ICD-10-CM | POA: Insufficient documentation

## 2014-04-06 DIAGNOSIS — Z87891 Personal history of nicotine dependence: Secondary | ICD-10-CM | POA: Diagnosis not present

## 2014-04-06 DIAGNOSIS — H5712 Ocular pain, left eye: Secondary | ICD-10-CM

## 2014-04-06 DIAGNOSIS — Z8639 Personal history of other endocrine, nutritional and metabolic disease: Secondary | ICD-10-CM | POA: Insufficient documentation

## 2014-04-06 MED ORDER — TETRACAINE HCL 0.5 % OP SOLN
2.0000 [drp] | Freq: Once | OPHTHALMIC | Status: AC
  Administered 2014-04-06: 2 [drp] via OPHTHALMIC
  Filled 2014-04-06: qty 2

## 2014-04-06 NOTE — ED Provider Notes (Signed)
CSN: [id number]     Arrival date & time 04/06/14  4196 History   First MD Initiated Contact with Patient 04/06/14 [phone]     Chief Complaint  Patient presents with  . Eye Pain     (Consider location/radiation/quality/duration/timing/severity/associated sxs/prior Treatment) HPI 80 year old female presents with left thigh pain since yesterday. She states she thinks she was brushing her teeth or hair when this started. She's worried that she has a foreign body in her eye. She states every time she closes her left eyelid the pain at the top of her eyes seems to worsen. No blurry vision, floaters, or eye redness. Someone at their facility looked at her eye and irrigated with normal saline and was unable to find any foreign objects. She feels like there's something under her left eyelid. She's very uncomfortable at night unable to sleep. She has an ophthalmologist, Dr. [name]  Past Medical History  Diagnosis Date  . Hypertension   . Atrial fibrillation 12/2012  . Current use of long term anticoagulation 12/2012    Eliquis  . Mild cognitive impairment   . Cataracts, both eyes 2005, 2013    Cataract extraction/IOL implant  . Diverticulosis 2008  . Hyponatremia     SIADH   Past Surgical History  Procedure Laterality Date  . Cesarean section    . Cataracts Bilateral    History reviewed. No pertinent family history. History  Substance Use Topics  . Smoking status: Former Research scientist (life sciences)  . Smokeless tobacco: Not on file  . Alcohol Use: No   OB History   Grav Para Term Preterm Abortions TAB SAB Ect Mult Living                 Review of Systems  Eyes: Positive for pain. Negative for photophobia, redness and visual disturbance.  Neurological: Negative for headaches.  All other systems reviewed and are negative.     Allergies  Aricept; Ciprofloxacin; Latex; Macrobid; and Tramadol  Home Medications   Prior to Admission medications   Medication Sig Start Date End Date Taking?  Authorizing Provider  apixaban (ELIQUIS) 2.5 MG TABS tablet Take 1 tablet (2.5 mg total) by mouth 2 (two) times daily. 01/04/13   Sharyon Cable, MD  CALCIUM PO Take 1 tablet by mouth daily.    Historical Provider, MD  enalapril (VASOTEC) 10 MG tablet Take 20 mg by mouth daily.    Historical Provider, MD  fish oil-omega-3 fatty acids 1000 MG capsule Take 2 g by mouth 2 (two) times daily.    Historical Provider, MD  loratadine (CLARITIN) 10 MG tablet Take 10 mg by mouth daily as needed for allergies.    Historical Provider, MD  metoprolol tartrate (LOPRESSOR) 25 MG tablet Take 25 mg by mouth as needed (Take 1 tablet if HR 120 or greater. Take 1/2 tablet if HR 110-120).    Historical Provider, MD  Multiple Vitamin (MULTIVITAMIN WITH MINERALS) TABS Take 1 tablet by mouth daily.    Historical Provider, MD  sodium chloride 1 G tablet Take 1 g by mouth 2 (two) times daily with a meal.    Historical Provider, MD  triamcinolone (NASACORT AQ) 55 MCG/ACT AERO nasal inhaler Place 2 sprays into the nose as needed.    Historical Provider, MD   BP 159/64  Pulse 53  Resp 20  SpO2 100% Physical Exam  Nursing note and vitals reviewed. Constitutional: She appears well-developed and well-nourished.  HENT:  Head: Normocephalic and atraumatic.  Right Ear: External ear  normal.  Left Ear: External ear normal.  Nose: Nose normal.  No temporal tenderness  Eyes: EOM and lids are normal. Pupils are equal, round, and reactive to light. Lids are everted and swept, no foreign bodies found. Right eye exhibits no discharge. Left eye exhibits no chemosis and no discharge. No foreign body present in the left eye. Left conjunctiva is not injected. Left conjunctiva has no hemorrhage.  Slit lamp exam:      The left eye shows no corneal abrasion, no foreign body and no fluorescein uptake.  IOP of left eye - 19  Cardiovascular: Regular rhythm.   Pulmonary/Chest: Effort normal.  Abdominal: She exhibits no distension.   Neurological: She is alert.  Skin: Skin is warm and dry.    ED Course  Procedures (including critical care time) Labs Review Labs Reviewed - No data to display  Imaging Review No results found.   EKG Interpretation None      MDM   Final diagnoses:  Left eye pain    Eye pain seemed to improve when tetracaine placed. Eyelids everted and no foreign body seen. No corneal abrasion. No visual changes or floaters. IOP 19. D/w Dr. Satira Sark, who recommends artificial tears q 1 hour while awake in case there is a small FB that could flush out. If her symptoms haven't improved, she is to follow up with her eye doctor tomorrow. She is ok with this plan and will return if symptoms worsen.    Ephraim Hamburger, MD 04/06/14 1055

## 2014-04-06 NOTE — ED Notes (Signed)
Per pt, eye pain since yesterday.  Pt states brushing teeth/hair when initial pain occurred.  Pt feels like foreign object in eye.  Flushed at [place] this morning with no relief.

## 2014-04-06 NOTE — ED Notes (Signed)
MD at bedside. 

## 2014-04-06 NOTE — Discharge Instructions (Signed)
Use artificial tears every hour to see if your pain improves. If your pain does not improve by tomorrow, call your Eye doctor for an appointment that day. If your pain worsens or you develop blurry vision or other concerning symptoms return to the ER.

## 2014-04-21 DIAGNOSIS — R059 Cough, unspecified: Secondary | ICD-10-CM | POA: Diagnosis not present

## 2014-04-21 DIAGNOSIS — R05 Cough: Secondary | ICD-10-CM | POA: Diagnosis not present

## 2014-04-21 DIAGNOSIS — J069 Acute upper respiratory infection, unspecified: Secondary | ICD-10-CM | POA: Diagnosis not present

## 2014-04-29 ENCOUNTER — Other Ambulatory Visit: Payer: Self-pay | Admitting: Internal Medicine

## 2014-04-29 DIAGNOSIS — E049 Nontoxic goiter, unspecified: Secondary | ICD-10-CM

## 2014-05-05 ENCOUNTER — Other Ambulatory Visit: Payer: Medicare Other

## 2014-05-13 ENCOUNTER — Ambulatory Visit
Admission: RE | Admit: 2014-05-13 | Discharge: 2014-05-13 | Disposition: A | Discharge status: Home / Self Care | Payer: Medicare Other | Source: Ambulatory Visit | Attending: Internal Medicine | Admitting: Internal Medicine

## 2014-05-13 ENCOUNTER — Other Ambulatory Visit (HOSPITAL_COMMUNITY): Payer: Self-pay | Admitting: Family Medicine

## 2014-05-13 DIAGNOSIS — Z1231 Encounter for screening mammogram for malignant neoplasm of breast: Secondary | ICD-10-CM

## 2014-05-13 DIAGNOSIS — E042 Nontoxic multinodular goiter: Secondary | ICD-10-CM | POA: Diagnosis not present

## 2014-05-13 DIAGNOSIS — E049 Nontoxic goiter, unspecified: Secondary | ICD-10-CM

## 2014-05-20 DIAGNOSIS — I4891 Unspecified atrial fibrillation: Secondary | ICD-10-CM | POA: Diagnosis not present

## 2014-05-20 DIAGNOSIS — I10 Essential (primary) hypertension: Secondary | ICD-10-CM | POA: Diagnosis not present

## 2014-05-20 DIAGNOSIS — R42 Dizziness and giddiness: Secondary | ICD-10-CM | POA: Diagnosis not present

## 2014-05-20 DIAGNOSIS — Z7901 Long term (current) use of anticoagulants: Secondary | ICD-10-CM | POA: Diagnosis not present

## 2014-06-24 ENCOUNTER — Ambulatory Visit (HOSPITAL_COMMUNITY)
Admission: RE | Admit: 2014-06-24 | Discharge: 2014-06-24 | Disposition: A | Discharge status: Home / Self Care | Payer: Medicare Other | Source: Ambulatory Visit | Attending: Family Medicine | Admitting: Family Medicine

## 2014-06-24 DIAGNOSIS — Z1231 Encounter for screening mammogram for malignant neoplasm of breast: Secondary | ICD-10-CM | POA: Insufficient documentation

## 2014-07-04 DIAGNOSIS — L02519 Cutaneous abscess of unspecified hand: Secondary | ICD-10-CM | POA: Diagnosis not present

## 2014-07-04 DIAGNOSIS — Z7901 Long term (current) use of anticoagulants: Secondary | ICD-10-CM | POA: Diagnosis not present

## 2014-07-04 DIAGNOSIS — I10 Essential (primary) hypertension: Secondary | ICD-10-CM | POA: Diagnosis not present

## 2014-07-04 DIAGNOSIS — L03119 Cellulitis of unspecified part of limb: Secondary | ICD-10-CM | POA: Diagnosis not present

## 2014-07-04 DIAGNOSIS — M79609 Pain in unspecified limb: Secondary | ICD-10-CM | POA: Diagnosis not present

## 2014-07-04 DIAGNOSIS — M7989 Other specified soft tissue disorders: Secondary | ICD-10-CM | POA: Diagnosis not present

## 2014-07-05 DIAGNOSIS — Z886 Allergy status to analgesic agent status: Secondary | ICD-10-CM | POA: Diagnosis not present

## 2014-07-05 DIAGNOSIS — Z881 Allergy status to other antibiotic agents status: Secondary | ICD-10-CM | POA: Diagnosis not present

## 2014-07-05 DIAGNOSIS — I498 Other specified cardiac arrhythmias: Secondary | ICD-10-CM | POA: Diagnosis not present

## 2014-07-05 DIAGNOSIS — L02519 Cutaneous abscess of unspecified hand: Secondary | ICD-10-CM | POA: Diagnosis not present

## 2014-07-05 DIAGNOSIS — L03119 Cellulitis of unspecified part of limb: Secondary | ICD-10-CM | POA: Diagnosis not present

## 2014-07-05 DIAGNOSIS — F0390 Unspecified dementia without behavioral disturbance: Secondary | ICD-10-CM | POA: Diagnosis not present

## 2014-07-07 DIAGNOSIS — L02519 Cutaneous abscess of unspecified hand: Secondary | ICD-10-CM | POA: Diagnosis not present

## 2014-07-07 DIAGNOSIS — L03119 Cellulitis of unspecified part of limb: Secondary | ICD-10-CM | POA: Diagnosis not present

## 2014-07-07 DIAGNOSIS — I498 Other specified cardiac arrhythmias: Secondary | ICD-10-CM | POA: Diagnosis not present

## 2014-07-08 DIAGNOSIS — Z7901 Long term (current) use of anticoagulants: Secondary | ICD-10-CM | POA: Diagnosis not present

## 2014-07-08 DIAGNOSIS — R42 Dizziness and giddiness: Secondary | ICD-10-CM | POA: Diagnosis not present

## 2014-07-08 DIAGNOSIS — I4891 Unspecified atrial fibrillation: Secondary | ICD-10-CM | POA: Diagnosis not present

## 2014-07-08 DIAGNOSIS — I10 Essential (primary) hypertension: Secondary | ICD-10-CM | POA: Diagnosis not present

## 2014-08-01 DIAGNOSIS — L821 Other seborrheic keratosis: Secondary | ICD-10-CM | POA: Diagnosis not present

## 2014-08-01 DIAGNOSIS — D239 Other benign neoplasm of skin, unspecified: Secondary | ICD-10-CM | POA: Diagnosis not present

## 2014-08-01 DIAGNOSIS — L570 Actinic keratosis: Secondary | ICD-10-CM | POA: Diagnosis not present

## 2014-08-01 DIAGNOSIS — Z85828 Personal history of other malignant neoplasm of skin: Secondary | ICD-10-CM | POA: Diagnosis not present

## 2014-09-06 ENCOUNTER — Telehealth: Payer: Self-pay | Admitting: Cardiology

## 2014-09-06 ENCOUNTER — Emergency Department (HOSPITAL_COMMUNITY): Payer: Medicare Other

## 2014-09-06 ENCOUNTER — Encounter (HOSPITAL_COMMUNITY): Payer: Self-pay | Admitting: Emergency Medicine

## 2014-09-06 ENCOUNTER — Observation Stay (HOSPITAL_COMMUNITY)
Admission: EM | Admit: 2014-09-06 | Discharge: 2014-09-07 | Disposition: A | Discharge status: Home / Self Care | Payer: Medicare Other | Attending: Internal Medicine | Admitting: Internal Medicine

## 2014-09-06 DIAGNOSIS — I4891 Unspecified atrial fibrillation: Secondary | ICD-10-CM | POA: Insufficient documentation

## 2014-09-06 DIAGNOSIS — N390 Urinary tract infection, site not specified: Principal | ICD-10-CM | POA: Diagnosis present

## 2014-09-06 DIAGNOSIS — M8580 Other specified disorders of bone density and structure, unspecified site: Secondary | ICD-10-CM | POA: Insufficient documentation

## 2014-09-06 DIAGNOSIS — I951 Orthostatic hypotension: Secondary | ICD-10-CM | POA: Diagnosis present

## 2014-09-06 DIAGNOSIS — I482 Chronic atrial fibrillation, unspecified: Secondary | ICD-10-CM

## 2014-09-06 DIAGNOSIS — R404 Transient alteration of awareness: Secondary | ICD-10-CM | POA: Diagnosis not present

## 2014-09-06 DIAGNOSIS — B9689 Other specified bacterial agents as the cause of diseases classified elsewhere: Secondary | ICD-10-CM | POA: Diagnosis not present

## 2014-09-06 DIAGNOSIS — Z91040 Latex allergy status: Secondary | ICD-10-CM | POA: Insufficient documentation

## 2014-09-06 DIAGNOSIS — G3184 Mild cognitive impairment, so stated: Secondary | ICD-10-CM

## 2014-09-06 DIAGNOSIS — R42 Dizziness and giddiness: Secondary | ICD-10-CM

## 2014-09-06 DIAGNOSIS — R531 Weakness: Secondary | ICD-10-CM | POA: Diagnosis not present

## 2014-09-06 DIAGNOSIS — Z87891 Personal history of nicotine dependence: Secondary | ICD-10-CM | POA: Diagnosis not present

## 2014-09-06 DIAGNOSIS — I10 Essential (primary) hypertension: Secondary | ICD-10-CM | POA: Diagnosis not present

## 2014-09-06 DIAGNOSIS — K5790 Diverticulosis of intestine, part unspecified, without perforation or abscess without bleeding: Secondary | ICD-10-CM | POA: Diagnosis not present

## 2014-09-06 DIAGNOSIS — Z7901 Long term (current) use of anticoagulants: Secondary | ICD-10-CM | POA: Diagnosis not present

## 2014-09-06 DIAGNOSIS — L719 Rosacea, unspecified: Secondary | ICD-10-CM | POA: Diagnosis not present

## 2014-09-06 DIAGNOSIS — Z881 Allergy status to other antibiotic agents status: Secondary | ICD-10-CM | POA: Insufficient documentation

## 2014-09-06 DIAGNOSIS — Z91018 Allergy to other foods: Secondary | ICD-10-CM | POA: Diagnosis not present

## 2014-09-06 DIAGNOSIS — Z79899 Other long term (current) drug therapy: Secondary | ICD-10-CM | POA: Diagnosis not present

## 2014-09-06 DIAGNOSIS — M6281 Muscle weakness (generalized): Secondary | ICD-10-CM | POA: Diagnosis not present

## 2014-09-06 DIAGNOSIS — R079 Chest pain, unspecified: Secondary | ICD-10-CM | POA: Diagnosis not present

## 2014-09-06 DIAGNOSIS — E222 Syndrome of inappropriate secretion of antidiuretic hormone: Secondary | ICD-10-CM | POA: Insufficient documentation

## 2014-09-06 DIAGNOSIS — E785 Hyperlipidemia, unspecified: Secondary | ICD-10-CM | POA: Insufficient documentation

## 2014-09-06 LAB — COMPREHENSIVE METABOLIC PANEL
ALT: 12 U/L (ref 0–35)
AST: 19 U/L (ref 0–37)
Albumin: 3.3 g/dL — ABNORMAL LOW (ref 3.5–5.2)
Alkaline Phosphatase: 54 U/L (ref 39–117)
Anion gap: 10 (ref 5–15)
BUN: 20 mg/dL (ref 6–23)
CALCIUM: 9.4 mg/dL (ref 8.4–10.5)
CO2: 24 mEq/L (ref 19–32)
Chloride: 103 mEq/L (ref 96–112)
Creatinine, Ser: 0.82 mg/dL (ref 0.50–1.10)
GFR calc non Af Amer: 63 mL/min — ABNORMAL LOW (ref >90)
GFR, EST AFRICAN AMERICAN: 74 mL/min — AB (ref >90)
GLUCOSE: 82 mg/dL (ref 70–99)
Potassium: 4.6 mEq/L (ref 3.7–5.3)
SODIUM: 137 meq/L (ref 137–147)
TOTAL PROTEIN: 6.6 g/dL (ref 6.0–8.3)
Total Bilirubin: 0.4 mg/dL (ref 0.3–1.2)

## 2014-09-06 LAB — URINALYSIS, ROUTINE W REFLEX MICROSCOPIC
Bilirubin Urine: NEGATIVE
Glucose, UA: NEGATIVE mg/dL
HGB URINE DIPSTICK: NEGATIVE
Ketones, ur: NEGATIVE mg/dL
Nitrite: POSITIVE — AB
Protein, ur: NEGATIVE mg/dL
SPECIFIC GRAVITY, URINE: 1.015 (ref 1.005–1.030)
Urobilinogen, UA: 0.2 mg/dL (ref 0.0–1.0)
pH: 7.5 (ref 5.0–8.0)

## 2014-09-06 LAB — CBC WITH DIFFERENTIAL/PLATELET
BASOS ABS: 0 10*3/uL (ref 0.0–0.1)
Basophils Relative: 1 % (ref 0–1)
EOS ABS: 0.1 10*3/uL (ref 0.0–0.7)
EOS PCT: 2 % (ref 0–5)
HCT: 40.7 % (ref 36.0–46.0)
Hemoglobin: 13.9 g/dL (ref 12.0–15.0)
LYMPHS ABS: 2.1 10*3/uL (ref 0.7–4.0)
Lymphocytes Relative: 36 % (ref 12–46)
MCH: 30.8 pg (ref 26.0–34.0)
MCHC: 34.2 g/dL (ref 30.0–36.0)
MCV: 90 fL (ref 78.0–100.0)
Monocytes Absolute: 0.7 10*3/uL (ref 0.1–1.0)
Monocytes Relative: 12 % (ref 3–12)
Neutro Abs: 2.9 10*3/uL (ref 1.7–7.7)
Neutrophils Relative %: 49 % (ref 43–77)
PLATELETS: 207 10*3/uL (ref 150–400)
RBC: 4.52 MIL/uL (ref 3.87–5.11)
RDW: 13.6 % (ref 11.5–15.5)
WBC: 5.8 10*3/uL (ref 4.0–10.5)

## 2014-09-06 LAB — URINE MICROSCOPIC-ADD ON

## 2014-09-06 LAB — TSH: TSH: 2.43 u[IU]/mL (ref 0.350–4.500)

## 2014-09-06 LAB — I-STAT TROPONIN, ED: Troponin i, poc: 0.04 ng/mL (ref 0.00–0.08)

## 2014-09-06 LAB — MRSA PCR SCREENING: MRSA BY PCR: NEGATIVE

## 2014-09-06 MED ORDER — OMEGA-3-ACID ETHYL ESTERS 1 G PO CAPS
1.0000 g | ORAL_CAPSULE | Freq: Two times a day (BID) | ORAL | Status: DC
  Administered 2014-09-06 – 2014-09-07 (×2): 1 g via ORAL
  Filled 2014-09-06 (×3): qty 1

## 2014-09-06 MED ORDER — OMEGA-3 FATTY ACIDS 1000 MG PO CAPS: 1.0000 g | ORAL_CAPSULE | Freq: Two times a day (BID) | ORAL | Status: DC

## 2014-09-06 MED ORDER — METOPROLOL SUCCINATE 12.5 MG HALF TABLET
12.5000 mg | ORAL_TABLET | Freq: Every day | ORAL | Status: DC
  Filled 2014-09-06 (×2): qty 1

## 2014-09-06 MED ORDER — DEXTROSE 5 % IV SOLN
1.0000 g | INTRAVENOUS | Status: DC
  Filled 2014-09-06: qty 10

## 2014-09-06 MED ORDER — CALCIUM CARBONATE 1250 (500 CA) MG PO TABS
1.0000 | ORAL_TABLET | Freq: Every day | ORAL | Status: DC
  Administered 2014-09-07: 500 mg via ORAL
  Filled 2014-09-06 (×2): qty 1

## 2014-09-06 MED ORDER — ACETAMINOPHEN 325 MG PO TABS: 650.0000 mg | ORAL_TABLET | Freq: Four times a day (QID) | ORAL | Status: DC | PRN

## 2014-09-06 MED ORDER — ONDANSETRON HCL 4 MG PO TABS: 4.0000 mg | ORAL_TABLET | Freq: Four times a day (QID) | ORAL | Status: DC | PRN

## 2014-09-06 MED ORDER — CALCIUM 600 MG PO TABS: 1500.0000 mg | ORAL_TABLET | Freq: Every day | ORAL | Status: DC

## 2014-09-06 MED ORDER — ACETAMINOPHEN 650 MG RE SUPP: 650.0000 mg | Freq: Four times a day (QID) | RECTAL | Status: DC | PRN

## 2014-09-06 MED ORDER — APIXABAN 2.5 MG PO TABS
2.5000 mg | ORAL_TABLET | Freq: Two times a day (BID) | ORAL | Status: DC
  Administered 2014-09-06 – 2014-09-07 (×2): 2.5 mg via ORAL
  Filled 2014-09-06 (×3): qty 1

## 2014-09-06 MED ORDER — SODIUM CHLORIDE 0.9 % IJ SOLN
3.0000 mL | Freq: Two times a day (BID) | INTRAMUSCULAR | Status: DC
  Administered 2014-09-06: 3 mL via INTRAVENOUS

## 2014-09-06 MED ORDER — DEXTROSE 5 % IV SOLN
1.0000 g | Freq: Once | INTRAVENOUS | Status: AC
  Administered 2014-09-06: 1 g via INTRAVENOUS
  Filled 2014-09-06: qty 10

## 2014-09-06 MED ORDER — SODIUM CHLORIDE 0.9 % IV BOLUS (SEPSIS)
1000.0000 mL | Freq: Once | INTRAVENOUS | Status: AC
Start: 2014-09-06 — End: 2014-09-06
  Administered 2014-09-06: 1000 mL via INTRAVENOUS

## 2014-09-06 MED ORDER — ONDANSETRON HCL 4 MG/2ML IJ SOLN: 4.0000 mg | Freq: Four times a day (QID) | INTRAMUSCULAR | Status: DC | PRN

## 2014-09-06 MED ORDER — SODIUM CHLORIDE 0.9 % IV SOLN
INTRAVENOUS | Status: DC
  Administered 2014-09-06 – 2014-09-07 (×2): via INTRAVENOUS

## 2014-09-06 MED ORDER — SODIUM CHLORIDE 1 G PO TABS
1.0000 g | ORAL_TABLET | Freq: Two times a day (BID) | ORAL | Status: DC
  Administered 2014-09-07: 1 g via ORAL
  Filled 2014-09-06 (×3): qty 1

## 2014-09-06 NOTE — ED Notes (Signed)
Unable to void at this time.

## 2014-09-06 NOTE — ED Notes (Signed)
Food tray has not  Arrived here.  Has been ordered

## 2014-09-06 NOTE — ED Provider Notes (Signed)
CSN: [id number]     Arrival date & time 09/06/14  1259 History   First MD Initiated Contact with Patient 09/06/14 1308     Chief Complaint  Patient presents with  . generalized weakness      (Consider location/radiation/quality/duration/timing/severity/associated sxs/prior Treatment) HPI Cathy Dickerson is a 80 y.o. female with PMH of afib on eliquis, HTN, hyperlipidema presenting with  generalized weakness and chest discomfort around 12:30pm. Chest discomfort has resolved. No SOB. Patient reports feeling lightheaded with standing. The room is not spinning. She cannot stand on her own. No HA, visual changes, slurred speech, focal weakness. Patient has episodes like this which she called "afib attacks" which resolved after taking metoprolol, which she did this time without resolution. Cardiologist is Dr. Wynonia Lawman. Patient also found to be orthostatic by EMS.  Patient also complained of increase urinary frequency, no dysuria. No foul odor to urine. No fevers, chills, cough or congestion. No recent illnesses. No nausea, vomiting or abdominal pain.  Patient from [place] facility.   Past Medical History  Diagnosis Date  . Hypertension   . Atrial fibrillation 12/2012  . Current use of long term anticoagulation 12/2012    Eliquis  . Mild cognitive impairment   . Cataracts, both eyes 2005, 2013    Cataract extraction/IOL implant  . Diverticulosis 2008  . Hyponatremia     SIADH  . Hyperlipidemia   . Rosacea   . Allergy   . Osteopenia   . Fracture of scaphoid bone of hand   . Arrhythmia     atrial fib  . Squamous cell carcinoma   . Actinic keratoses   . Basal cell carcinoma of skin    Past Surgical History  Procedure Laterality Date  . Cesarean section    . Cataracts Bilateral    History reviewed. No pertinent family history. History  Substance Use Topics  . Smoking status: Former Research scientist (life sciences)  . Smokeless tobacco: Not on file  . Alcohol Use: No   OB History   Grav Para  Term Preterm Abortions TAB SAB Ect Mult Living                 Review of Systems  Constitutional: Negative for fever and chills.  HENT: Negative for congestion and rhinorrhea.   Eyes: Negative for visual disturbance.  Respiratory: Negative for cough and shortness of breath.   Cardiovascular: Positive for palpitations. Negative for chest pain.  Gastrointestinal: Negative for nausea, vomiting and diarrhea.  Genitourinary: Negative for dysuria and hematuria.  Musculoskeletal: Positive for gait problem. Negative for back pain.  Skin: Negative for rash.  Neurological: Positive for dizziness and weakness. Negative for headaches.      Allergies  Aricept; Ciprofloxacin; Latex; Macrobid; Macrodantin; Other; Strawberry; and Tramadol  Home Medications   Prior to Admission medications   Medication Sig Start Date End Date Taking? Authorizing Provider  apixaban (ELIQUIS) 2.5 MG TABS tablet Take 1 tablet (2.5 mg total) by mouth 2 (two) times daily. 01/04/13  Yes Sharyon Cable, MD  CALCIUM PO Take 1 tablet by mouth daily.   Yes Historical Provider, MD  fish oil-omega-3 fatty acids 1000 MG capsule Take 1 g by mouth 2 (two) times daily.    Yes Historical Provider, MD  metoprolol succinate (TOPROL-XL) 25 MG 24 hr tablet Take 12.5 mg by mouth daily.   Yes Historical Provider, MD  metoprolol tartrate (LOPRESSOR) 25 MG tablet Take 12.5-25 mg by mouth daily as needed (Take 1 tablet if HR 120  or greater. Take 1/2 tablet if HR 110-120).    Yes Historical Provider, MD  Multiple Vitamins-Minerals (MULTIVITAMIN PO) Take 1 tablet by mouth daily.   Yes Historical Provider, MD  sodium chloride 1 G tablet Take 1 g by mouth 2 (two) times daily with a meal.   Yes Historical Provider, MD   BP 103/78  Pulse 42  Temp(Src) 97.7 F (36.5 C) (Oral)  Resp 15  SpO2 97% Physical Exam  Nursing note and vitals reviewed. Constitutional: She appears well-developed and well-nourished. No distress.  HENT:  Head:  Normocephalic and atraumatic.  Mouth/Throat: Oropharynx is clear and moist.  Eyes: Conjunctivae and EOM are normal. Pupils are equal, round, and reactive to light. Right eye exhibits no discharge. Left eye exhibits no discharge.  Neck: Normal range of motion. Neck supple.  Cardiovascular: Normal rate.   Irregular rhythm, pt in afib. No peripheral edema or tenderness.  Pulmonary/Chest: Effort normal and breath sounds normal. No respiratory distress. She has no wheezes.  Abdominal: Soft. Bowel sounds are normal. She exhibits no distension. There is no tenderness.  Neurological: She is alert. No cranial nerve deficit. Coordination normal.  Strength 5/5 in upper and lower extremities. Sensation intact. Intact rapid alternating movements, finger to nose, and heel to shin. No pronator drift. Patient cannot stand on her own.    Skin: Skin is warm and dry. She is not diaphoretic.    ED Course  Procedures (including critical care time) Labs Review Labs Reviewed  COMPREHENSIVE METABOLIC PANEL - Abnormal; Notable for the following:    Albumin 3.3 (*)    GFR calc non Af Amer 63 (*)    GFR calc Af Amer 74 (*)    All other components within normal limits  URINALYSIS, ROUTINE W REFLEX MICROSCOPIC - Abnormal; Notable for the following:    APPearance TURBID (*)    Nitrite POSITIVE (*)    Leukocytes, UA TRACE (*)    All other components within normal limits  URINE MICROSCOPIC-ADD ON - Abnormal; Notable for the following:    Bacteria, UA MANY (*)    Casts GRANULAR CAST (*)    All other components within normal limits  CBC WITH DIFFERENTIAL  Randolm Idol, ED    Imaging Review Dg Chest 1 View  09/06/2014   CLINICAL DATA:  Intermittent chest pain today.  EXAM: CHEST - 1 VIEW  COMPARISON:  01/04/2013 and 11/08/2010  FINDINGS: Lungs are adequately inflated without focal consolidation or effusion. There is a stable 4 mm calcified granuloma over the right upper lobe. Cardiomediastinal silhouette is  within normal. There is calcified plaque over the thoracic aorta. Remainder of the exam is unchanged.  IMPRESSION: No acute cardiopulmonary disease.   Electronically Signed   By: Marin Olp M.D.   On: 09/06/2014 16:29     EKG Interpretation   Date/Time:  Saturday September 06 2014 13:08:21 EDT Ventricular Rate:  99 PR Interval:    QRS Duration: 63 QT Interval:  339 QTC Calculation: 435 R Axis:   70 Text Interpretation:  Atrial fibrillation No significant change was found  Confirmed by [facility]  MD, TREY ([id number]) on 09/06/2014 3:15:39 PM     Meds given in ED:  Medications  cefTRIAXone (ROCEPHIN) 1 g in dextrose 5 % 50 mL IVPB (not administered)  sodium chloride 0.9 % bolus 1,000 mL (0 mLs Intravenous Stopped 09/06/14 1624)    New Prescriptions   No medications on file      MDM   Final diagnoses:  Generalized weakness  UTI (lower urinary tract infection)  Chronic atrial fibrillation   Patient with h/o afib presenting with generalized weakness and lightheadedness. Patient denies focal neurological weakness, HA, visual changes, n/v. VSS. Patient found to be profoundly orthostatic. Neuro exam without focal neurological deficits. Patient cannot stand on her own. Patient given IV fluids. Patient feeling much better. But still unable to stand. Patient in afib but rate controlled. Negative troponin and CXR. Patient with UTI as well. Given rocephin. Consult to hospitalists. Spoke with Dr. Netta Cedars who agrees to admit for observation.   Discussed all results and patient verbalizes understanding and agrees with plan.  This is a shared patient. This patient was discussed with the physician who saw and evaluated the patient and agrees with the plan.      Pura Spice, PA-C 09/06/14 [phone]

## 2014-09-06 NOTE — ED Notes (Signed)
Dinner tray ordered.

## 2014-09-06 NOTE — H&P (Signed)
[facility] History and Physical  Cathy Dickerson FTD:[id number] DOB: 1927/11/03 DOA: 09/06/2014  Referring physician:  PCP: Cathy Blackbird, MD  Specialists:   Chief Complaint: dizziness   HPI: Cathy Dickerson is a 80 y.o. female with PMH of MCI, [place], A fib (on apixaban), presented with generalized weakness, associated with lightheaded with standing for 1-2 days; she also reports subjective fever, chills; denies focal neuro weakness, no paresthesia, no change in vision, or hearing, no vertigo; Pt also denies chest pain, no SOB, no cough, no nausea, vomiting or diarrhea  -ED found to have UTI, orthostatic hypotension    Review of Systems: The patient denies anorexia, fever, weight loss,, vision loss, decreased hearing, hoarseness, chest pain, syncope, dyspnea on exertion, peripheral edema, balance deficits, hemoptysis, abdominal pain, melena, hematochezia, severe indigestion/heartburn, hematuria, incontinence, genital sores, muscle weakness, suspicious skin lesions, transient blindness, difficulty walking, depression, unusual weight change, abnormal bleeding, enlarged lymph nodes, angioedema, and breast masses.    Past Medical History  Diagnosis Date  . Hypertension   . Atrial fibrillation 12/2012  . Current use of long term anticoagulation 12/2012    Eliquis  . Mild cognitive impairment   . Cataracts, both eyes 2005, 2013    Cataract extraction/IOL implant  . Diverticulosis 2008  . Hyponatremia     SIADH  . Hyperlipidemia   . Rosacea   . Allergy   . Osteopenia   . Fracture of scaphoid bone of hand   . Arrhythmia     atrial fib  . Squamous cell carcinoma   . Actinic keratoses   . Basal cell carcinoma of skin    Past Surgical History  Procedure Laterality Date  . Cesarean section    . Cataracts Bilateral    Social History:  reports that she has quit smoking. She does not have any smokeless tobacco history on file. She reports that she does not drink alcohol or use illicit  drugs. Home;  where does patient live--home, ALF, SNF? and with whom if at home? Yes;  Can patient participate in ADLs?  Allergies  Allergen Reactions  . Aricept [Donepezil Hcl] Other (See Comments)    Reaction unknown  . Ciprofloxacin Other (See Comments)    Reaction unknown  . Latex Other (See Comments)    Reaction unknown  . Macrobid [Nitrofurantoin Macrocrystal] Other (See Comments)    Reaction unknown  . Macrodantin [Nitrofurantoin] Other (See Comments)    Upset stomach  . Other Other (See Comments)    Popcorn- Diverticulosis  . Strawberry Other (See Comments)    Diverticulosis   . Tramadol Other (See Comments)    Reaction unknown    History reviewed. No pertinent family history.  (be sure to complete)  Prior to Admission medications   Medication Sig Start Date End Date Taking? Authorizing Provider  apixaban (ELIQUIS) 2.5 MG TABS tablet Take 1 tablet (2.5 mg total) by mouth 2 (two) times daily. 01/04/13  Yes Sharyon Cable, MD  CALCIUM PO Take 1 tablet by mouth daily.   Yes Historical Provider, MD  fish oil-omega-3 fatty acids 1000 MG capsule Take 1 g by mouth 2 (two) times daily.    Yes Historical Provider, MD  metoprolol succinate (TOPROL-XL) 25 MG 24 hr tablet Take 12.5 mg by mouth daily.   Yes Historical Provider, MD  metoprolol tartrate (LOPRESSOR) 25 MG tablet Take 12.5-25 mg by mouth daily as needed (Take 1 tablet if HR 120 or greater. Take 1/2 tablet if HR 110-120).  Yes Historical Provider, MD  Multiple Vitamins-Minerals (MULTIVITAMIN PO) Take 1 tablet by mouth daily.   Yes Historical Provider, MD  sodium chloride 1 G tablet Take 1 g by mouth 2 (two) times daily with a meal.   Yes Historical Provider, MD   Physical Exam: Filed Vitals:   09/06/14 1430  BP: 103/78  Pulse: 42  Temp:   Resp: 15     General:  alert  Eyes: eom-i  ENT: no oral ulcers   Neck: supple   Cardiovascular: s1,s2 irregular   Respiratory: CTA BL  Abdomen: so  Skin: no  rash   Musculoskeletal: no LE edema  Psychiatric: no hallucinations   Neurologic: CN 2-12 intact, motor 5/5 BL symmetric   Labs on Admission:  Basic Metabolic Panel:  Recent Labs Lab 09/06/14 1314  NA 137  K 4.6  CL 103  CO2 24  GLUCOSE 82  BUN 20  CREATININE 0.82  CALCIUM 9.4   Liver Function Tests:  Recent Labs Lab 09/06/14 1314  AST 19  ALT 12  ALKPHOS 54  BILITOT 0.4  PROT 6.6  ALBUMIN 3.3*   No results found for this basename: LIPASE, AMYLASE,  in the last 168 hours No results found for this basename: AMMONIA,  in the last 168 hours CBC:  Recent Labs Lab 09/06/14 1314  WBC 5.8  NEUTROABS 2.9  HGB 13.9  HCT 40.7  MCV 90.0  PLT 207   Cardiac Enzymes: No results found for this basename: CKTOTAL, CKMB, CKMBINDEX, TROPONINI,  in the last 168 hours  BNP (last 3 results) No results found for this basename: PROBNP,  in the last 8760 hours CBG: No results found for this basename: GLUCAP,  in the last 168 hours  Radiological Exams on Admission: Dg Chest 1 View  09/06/2014   CLINICAL DATA:  Intermittent chest pain today.  EXAM: CHEST - 1 VIEW  COMPARISON:  01/04/2013 and 11/08/2010  FINDINGS: Lungs are adequately inflated without focal consolidation or effusion. There is a stable 4 mm calcified granuloma over the right upper lobe. Cardiomediastinal silhouette is within normal. There is calcified plaque over the thoracic aorta. Remainder of the exam is unchanged.  IMPRESSION: No acute cardiopulmonary disease.   Electronically Signed   By: Marin Olp M.D.   On: 09/06/2014 16:29    EKG: Independently reviewed.   Assessment/Plan Active Problems:   Generalized weakness   UTI (lower urinary tract infection)   80 y.o. female with PMH of MCI, HTN, A fib (on apixaban), presented with generalized weakness, associated with lightheaded with standing for 1-2 days and found to have UTI, orthostatic hypotension   1. Probable UTI, started on IV atx; f/u  cultures 2. Dizziness with orthostatic hypotension; neuro exam no focal;  -cont IVF; repeat orthostatics; check TSH 3. A fib (on apixaban), cont BB; monitor on tele  4. HTN, currently orthostatic, BP is soft; Pt is on BB; monitor   None;  if consultant consulted, please document name and whether formally or informally consulted  Code Status: full (must indicate code status--if unknown or must be presumed, indicate so) Family Communication:  D/w patient, her husband  (indicate person spoken with, if applicable, with phone number if by telephone) Disposition Plan: home 24-48 hrs  (indicate anticipated LOS)  Time spent: >35 minutes   Kinnie Feil [facility] Pager [phone]  If 7PM-7AM, please contact night-coverage [email or web address] Password [email or web address] 09/06/2014, 4:45 PM

## 2014-09-06 NOTE — ED Notes (Signed)
Attempted to call report was told they did not know they were getting a pt. i need to call back in a few

## 2014-09-06 NOTE — Telephone Encounter (Signed)
   Patient is followed by Dr. Wynonia Lawman and a resident at [facility].   Was called by nurse, Alain Marion, at [facility] and notified that patient has noted feeling very poorly for the last several days. She has a history of PAF on metoprolol for rate control and low dose Eliquis for stroke prophylaxis. She has been instructed to take an extra half tablet, 12.5 mg of metoprolol, when she develops symptomatic afib.  RN reports that patient's pulse was noted to be very irregular and in the 120s overnight. She has continued to have irregular pulse rates though the day and continues to feel very poorly, noting weakness and a near syncopal spell earlier this am. She has increased her BB as directed but continues to be symptomatic although rate has improved in the 80s. Most recent BP is 100/60 (her normal systolic pressures run in the 140s). She also notes  increased urinary frequency and has a h/o frequent UTIs. No dysuria, fever, chills, back or flank pain. She has been fully compliant with her meds including BID dosing of Eliquis.  Her RN reports that she will bring her to the ER. Given her symptoms, I feel this is reasonable. Pt will be assessed by ED MD. Cardiology will be consulted if need.   Lyda Jester, PA-C 09/06/2014

## 2014-09-06 NOTE — ED Notes (Signed)
Admitting doctor at the bedside.  Rocephin already hanging.  Unable to get ot the pt to scan her until  The doctor is finished.  Pt up to the btr

## 2014-09-06 NOTE — ED Notes (Signed)
Report given to rn on 6e 

## 2014-09-06 NOTE — Progress Notes (Signed)
Pt alert and responsive; resting at this time. Pt manual BP 104/48 HR 62. Notified on call T. Rogue Bussing, NP. Orders to hold BP medication. Will continue to monitor.

## 2014-09-06 NOTE — ED Notes (Signed)
Pt c/o lt arm coldness where she is getting a fluid bolus.  Warm blanket given.  Husband at bedside.   Drinks given per their request

## 2014-09-06 NOTE — ED Notes (Signed)
Per EMS, pt comes from [facility] facility with c/o generalized weakness and increase urinary frequency. Pt A&OX4, NAD noted. Pt reports dizziness when standing since last night. Pts orthostatic BP was 88/50, initial. Pt has h/o of afib with control anticoagulant Eliquis. Pt has taken all meds today. VSS: BP 107/60, P100, R12. 20G IV placed left forearm, saline locked. Husband at bedside.

## 2014-09-06 NOTE — ED Notes (Signed)
Report given to Chris C.

## 2014-09-06 NOTE — ED Notes (Signed)
2nd attempt to call report will call back

## 2014-09-06 NOTE — ED Notes (Signed)
Asked for a urine sample and pt stated maybe in a little bit

## 2014-09-06 NOTE — ED Notes (Signed)
To x-ray

## 2014-09-07 DIAGNOSIS — N390 Urinary tract infection, site not specified: Secondary | ICD-10-CM | POA: Diagnosis not present

## 2014-09-07 DIAGNOSIS — R531 Weakness: Secondary | ICD-10-CM

## 2014-09-07 DIAGNOSIS — I482 Chronic atrial fibrillation: Secondary | ICD-10-CM

## 2014-09-07 DIAGNOSIS — I951 Orthostatic hypotension: Secondary | ICD-10-CM

## 2014-09-07 MED ORDER — METOPROLOL SUCCINATE ER 25 MG PO TB24: 12.5000 mg | ORAL_TABLET | Freq: Every day | ORAL | Status: DC

## 2014-09-07 MED ORDER — CEFUROXIME AXETIL 500 MG PO TABS: 500.0000 mg | ORAL_TABLET | Freq: Two times a day (BID) | ORAL | Status: DC

## 2014-09-07 NOTE — ED Provider Notes (Signed)
Medical screening examination/treatment/procedure(s) were conducted as a shared visit with non-physician practitioner(s) and myself.  I personally evaluated the patient during the encounter.   EKG Interpretation   Date/Time:  Saturday September 06 2014 13:08:21 EDT Ventricular Rate:  99 PR Interval:    QRS Duration: 63 QT Interval:  339 QTC Calculation: 435 R Axis:   70 Text Interpretation:  Atrial fibrillation No significant change was found  Confirmed by [facility]  MD, TREY ([id number]) on 09/06/2014 3:15:39 PM        Artis Delay, MD 09/07/14 [phone]

## 2014-09-07 NOTE — Progress Notes (Signed)
Discharge instructions and medications discussed with patient and husband. Prescription given to husband.  All questions answered.

## 2014-09-07 NOTE — ED Provider Notes (Signed)
Medical screening examination/treatment/procedure(s) were conducted as a shared visit with non-physician practitioner(s) and myself.  I personally evaluated the patient during the encounter.   EKG Interpretation   Date/Time:  Saturday September 06 2014 13:08:21 EDT Ventricular Rate:  99 PR Interval:    QRS Duration: 63 QT Interval:  339 QTC Calculation: 435 R Axis:   70 Text Interpretation:  Atrial fibrillation No significant change was found  Confirmed by [facility]  MD, TREY ([id number]) on 09/06/2014 3:15:39 PM      80 yo female presenting with generalized weakness and near syncope.  On exam, well appearing, nontoxic, not distressed, lying comfortably in bed, normal respiratory effort, normal perfusion, heart sounds normal with irregularly irregular rhythm, lung sounds CTAB.  She was significantly orthostatic.  ED workup demonstrated UTI.  Treated with Rocephin.  Admit to medicine.   Clinical Impression: 1. Generalized weakness   2. UTI (lower urinary tract infection)   3. Chronic atrial fibrillation   4. Orthostatic hypotension   5. Orthostatic dizziness       Artis Delay, MD 09/07/14 [phone]

## 2014-09-07 NOTE — Discharge Summary (Signed)
Physician Discharge Summary  Cathy Dickerson TLX:[id number] DOB: 10-29-1927 DOA: 09/06/2014  PCP: Antony Blackbird, MD  Admit date: 09/06/2014 Discharge date: 09/07/2014  Discharge Diagnoses:  Primary problem UTI    Generalized weakness Orthostatic hypotension Atrial fibrillation  Discharge Condition:stable  Filed Weights   09/06/14 1834 09/06/14 2140  Weight: 53 kg (116 lb 13.5 oz) 54.2 kg (119 lb 7.8 oz)    History of present illness:  80 y.o. female with PMH of MCI, HTH, A fib (on apixaban), presented with generalized weakness, associated with lightheaded with standing for 1-2 days; she also reports subjective fever, chills; denies focal neuro weakness, no paresthesia, no change in vision, or hearing, no vertigo; Pt also denies chest pain, no SOB, no cough, no nausea, vomiting or diarrhea  -ED found to have UTI, orthostatic hypotension   Hospital Course:  Started on ceftriaxone, saline infusion, metoprolol held. orthostasis and weakness resolved. By discharge, feeling back to baseline  Procedures:  none  Consultations:  none  Discharge Exam: Filed Vitals:   09/07/14 0552  BP: 136/51  Pulse: 57  Temp: 98.3 F (36.8 C)  Resp: 18    General:  A and o Cardiovascular: RRR Respiratory: CTA  Discharge Instructions   Activity as tolerated - No restrictions    Complete by:  As directed      Diet general    Complete by:  As directed           Current Discharge Medication List    START taking these medications   Details  cefUROXime (CEFTIN) 500 MG tablet Take 1 tablet (500 mg total) by mouth 2 (two) times daily with a meal. Qty: 8 tablet, Refills: 0      CONTINUE these medications which have CHANGED   Details  metoprolol succinate (TOPROL-XL) 25 MG 24 hr tablet Take 0.5 tablets (12.5 mg total) by mouth daily. HOLD DOSE TONIGHT      CONTINUE these medications which have NOT CHANGED   Details  apixaban (ELIQUIS) 2.5 MG TABS tablet Take 1 tablet (2.5 mg total)  by mouth 2 (two) times daily. Qty: 14 tablet, Refills: 0    CALCIUM PO Take 1 tablet by mouth daily.    fish oil-omega-3 fatty acids 1000 MG capsule Take 1 g by mouth 2 (two) times daily.     metoprolol tartrate (LOPRESSOR) 25 MG tablet Take 12.5-25 mg by mouth daily as needed (Take 1 tablet if HR 120 or greater. Take 1/2 tablet if HR 110-120).     Multiple Vitamins-Minerals (MULTIVITAMIN PO) Take 1 tablet by mouth daily.    sodium chloride 1 G tablet Take 1 g by mouth 2 (two) times daily with a meal.       Allergies  Allergen Reactions  . Aricept [Donepezil Hcl] Other (See Comments)    Reaction unknown  . Ciprofloxacin Other (See Comments)    Reaction unknown  . Latex Other (See Comments)    Reaction unknown  . Macrobid [Nitrofurantoin Macrocrystal] Other (See Comments)    Reaction unknown  . Macrodantin [Nitrofurantoin] Other (See Comments)    Upset stomach  . Other Other (See Comments)    Popcorn- Diverticulosis  . Strawberry Other (See Comments)    Diverticulosis   . Tramadol Other (See Comments)    Reaction unknown   Follow-up Information   Follow up with FULP, CAMMIE, MD. (As needed)    Specialty:  Family Medicine   Contact information:   [place] [address]  [phone]        The results of significant diagnostics from this hospitalization (including imaging, microbiology, ancillary and laboratory) are listed below for reference.    Significant Diagnostic Studies: Dg Chest 1 View  09/06/2014   CLINICAL DATA:  Intermittent chest pain today.  EXAM: CHEST - 1 VIEW  COMPARISON:  01/04/2013 and 11/08/2010  FINDINGS: Lungs are adequately inflated without focal consolidation or effusion. There is a stable 4 mm calcified granuloma over the right upper lobe. Cardiomediastinal silhouette is within normal. There is calcified plaque over the thoracic aorta. Remainder of the exam is unchanged.  IMPRESSION: No acute cardiopulmonary disease.    Electronically Signed   By: Marin Olp M.D.   On: 09/06/2014 16:29    Microbiology: Recent Results (from the past 240 hour(s))  MRSA PCR SCREENING     Status: None   Collection Time    09/06/14  7:00 PM      Result Value Ref Range Status   MRSA by PCR NEGATIVE  NEGATIVE Final   Comment:            The GeneXpert MRSA Assay (FDA     approved for NASAL specimens     only), is one component of a     comprehensive MRSA colonization     surveillance program. It is not     intended to diagnose MRSA     infection nor to guide or     monitor treatment for     MRSA infections.     Labs: Basic Metabolic Panel:  Recent Labs Lab 09/06/14 1314  NA 137  K 4.6  CL 103  CO2 24  GLUCOSE 82  BUN 20  CREATININE 0.82  CALCIUM 9.4   Liver Function Tests:  Recent Labs Lab 09/06/14 1314  AST 19  ALT 12  ALKPHOS 54  BILITOT 0.4  PROT 6.6  ALBUMIN 3.3*   No results found for this basename: LIPASE, AMYLASE,  in the last 168 hours No results found for this basename: AMMONIA,  in the last 168 hours CBC:  Recent Labs Lab 09/06/14 1314  WBC 5.8  NEUTROABS 2.9  HGB 13.9  HCT 40.7  MCV 90.0  PLT 207   Cardiac Enzymes: No results found for this basename: CKTOTAL, CKMB, CKMBINDEX, TROPONINI,  in the last 168 hours BNP: BNP (last 3 results) No results found for this basename: PROBNP,  in the last 8760 hours CBG: No results found for this basename: GLUCAP,  in the last 168 hours     Signed:  Tryon L  [facility] 09/07/2014, 9:57 AM

## 2014-09-07 NOTE — Progress Notes (Signed)
UR completed 

## 2014-09-09 LAB — URINE CULTURE
Colony Count: NO GROWTH
Culture: NO GROWTH

## 2014-09-12 DIAGNOSIS — M79604 Pain in right leg: Secondary | ICD-10-CM | POA: Diagnosis not present

## 2014-09-12 DIAGNOSIS — N390 Urinary tract infection, site not specified: Secondary | ICD-10-CM | POA: Diagnosis not present

## 2014-09-12 DIAGNOSIS — E871 Hypo-osmolality and hyponatremia: Secondary | ICD-10-CM | POA: Diagnosis not present

## 2014-09-12 DIAGNOSIS — R5383 Other fatigue: Secondary | ICD-10-CM | POA: Diagnosis not present

## 2014-09-19 ENCOUNTER — Other Ambulatory Visit: Payer: Self-pay | Admitting: Family Medicine

## 2014-09-19 ENCOUNTER — Ambulatory Visit
Admission: RE | Admit: 2014-09-19 | Discharge: 2014-09-19 | Disposition: A | Discharge status: Home / Self Care | Payer: Medicare Other | Source: Ambulatory Visit | Attending: Family Medicine | Admitting: Family Medicine

## 2014-09-19 DIAGNOSIS — M542 Cervicalgia: Secondary | ICD-10-CM | POA: Diagnosis not present

## 2014-09-19 DIAGNOSIS — M25552 Pain in left hip: Secondary | ICD-10-CM | POA: Diagnosis not present

## 2014-09-19 DIAGNOSIS — S139XXA Sprain of joints and ligaments of unspecified parts of neck, initial encounter: Secondary | ICD-10-CM | POA: Diagnosis not present

## 2014-09-19 DIAGNOSIS — W19XXXA Unspecified fall, initial encounter: Secondary | ICD-10-CM

## 2014-09-19 DIAGNOSIS — S199XXA Unspecified injury of neck, initial encounter: Secondary | ICD-10-CM | POA: Diagnosis not present

## 2014-09-19 DIAGNOSIS — S79919A Unspecified injury of unspecified hip, initial encounter: Secondary | ICD-10-CM | POA: Diagnosis not present

## 2014-09-30 ENCOUNTER — Emergency Department (HOSPITAL_COMMUNITY)
Admission: EM | Admit: 2014-09-30 | Discharge: 2014-09-30 | Disposition: A | Discharge status: Home / Self Care | Payer: Medicare Other | Attending: Emergency Medicine | Admitting: Emergency Medicine

## 2014-09-30 ENCOUNTER — Encounter (HOSPITAL_COMMUNITY): Payer: Self-pay | Admitting: Emergency Medicine

## 2014-09-30 ENCOUNTER — Emergency Department (HOSPITAL_COMMUNITY): Payer: Medicare Other

## 2014-09-30 DIAGNOSIS — Z8669 Personal history of other diseases of the nervous system and sense organs: Secondary | ICD-10-CM | POA: Insufficient documentation

## 2014-09-30 DIAGNOSIS — R410 Disorientation, unspecified: Secondary | ICD-10-CM | POA: Diagnosis not present

## 2014-09-30 DIAGNOSIS — Z79899 Other long term (current) drug therapy: Secondary | ICD-10-CM | POA: Diagnosis not present

## 2014-09-30 DIAGNOSIS — I4891 Unspecified atrial fibrillation: Secondary | ICD-10-CM | POA: Insufficient documentation

## 2014-09-30 DIAGNOSIS — Z8781 Personal history of (healed) traumatic fracture: Secondary | ICD-10-CM | POA: Diagnosis not present

## 2014-09-30 DIAGNOSIS — Y998 Other external cause status: Secondary | ICD-10-CM | POA: Insufficient documentation

## 2014-09-30 DIAGNOSIS — S0990XD Unspecified injury of head, subsequent encounter: Secondary | ICD-10-CM

## 2014-09-30 DIAGNOSIS — Z8719 Personal history of other diseases of the digestive system: Secondary | ICD-10-CM | POA: Diagnosis not present

## 2014-09-30 DIAGNOSIS — Y9389 Activity, other specified: Secondary | ICD-10-CM | POA: Diagnosis not present

## 2014-09-30 DIAGNOSIS — Y929 Unspecified place or not applicable: Secondary | ICD-10-CM | POA: Insufficient documentation

## 2014-09-30 DIAGNOSIS — Z8639 Personal history of other endocrine, nutritional and metabolic disease: Secondary | ICD-10-CM | POA: Insufficient documentation

## 2014-09-30 DIAGNOSIS — Z8739 Personal history of other diseases of the musculoskeletal system and connective tissue: Secondary | ICD-10-CM | POA: Insufficient documentation

## 2014-09-30 DIAGNOSIS — Z85828 Personal history of other malignant neoplasm of skin: Secondary | ICD-10-CM | POA: Diagnosis not present

## 2014-09-30 DIAGNOSIS — I10 Essential (primary) hypertension: Secondary | ICD-10-CM | POA: Diagnosis not present

## 2014-09-30 DIAGNOSIS — W01198A Fall on same level from slipping, tripping and stumbling with subsequent striking against other object, initial encounter: Secondary | ICD-10-CM | POA: Insufficient documentation

## 2014-09-30 DIAGNOSIS — Z91040 Latex allergy status: Secondary | ICD-10-CM | POA: Diagnosis not present

## 2014-09-30 DIAGNOSIS — S0990XA Unspecified injury of head, initial encounter: Secondary | ICD-10-CM | POA: Diagnosis not present

## 2014-09-30 DIAGNOSIS — S199XXA Unspecified injury of neck, initial encounter: Secondary | ICD-10-CM | POA: Diagnosis not present

## 2014-09-30 DIAGNOSIS — W19XXXA Unspecified fall, initial encounter: Secondary | ICD-10-CM

## 2014-09-30 DIAGNOSIS — M542 Cervicalgia: Secondary | ICD-10-CM

## 2014-09-30 DIAGNOSIS — Z87891 Personal history of nicotine dependence: Secondary | ICD-10-CM | POA: Insufficient documentation

## 2014-09-30 DIAGNOSIS — Z7901 Long term (current) use of anticoagulants: Secondary | ICD-10-CM | POA: Diagnosis not present

## 2014-09-30 LAB — URINALYSIS, ROUTINE W REFLEX MICROSCOPIC
Bilirubin Urine: NEGATIVE
GLUCOSE, UA: NEGATIVE mg/dL
Ketones, ur: NEGATIVE mg/dL
LEUKOCYTES UA: NEGATIVE
Nitrite: NEGATIVE
PH: 6 (ref 5.0–8.0)
Protein, ur: NEGATIVE mg/dL
Specific Gravity, Urine: 1.014 (ref 1.005–1.030)
Urobilinogen, UA: 0.2 mg/dL (ref 0.0–1.0)

## 2014-09-30 LAB — BASIC METABOLIC PANEL
Anion gap: 12 (ref 5–15)
BUN: 23 mg/dL (ref 6–23)
CO2: 26 meq/L (ref 19–32)
Calcium: 9.2 mg/dL (ref 8.4–10.5)
Chloride: 98 mEq/L (ref 96–112)
Creatinine, Ser: 0.82 mg/dL (ref 0.50–1.10)
GFR calc Af Amer: 74 mL/min — ABNORMAL LOW (ref >90)
GFR calc non Af Amer: 63 mL/min — ABNORMAL LOW (ref >90)
GLUCOSE: 90 mg/dL (ref 70–99)
POTASSIUM: 4.3 meq/L (ref 3.7–5.3)
SODIUM: 136 meq/L — AB (ref 137–147)

## 2014-09-30 LAB — CBC WITH DIFFERENTIAL/PLATELET
Basophils Absolute: 0 10*3/uL (ref 0.0–0.1)
Basophils Relative: 0 % (ref 0–1)
Eosinophils Absolute: 0.1 10*3/uL (ref 0.0–0.7)
Eosinophils Relative: 1 % (ref 0–5)
HEMATOCRIT: 39.7 % (ref 36.0–46.0)
Hemoglobin: 13.7 g/dL (ref 12.0–15.0)
LYMPHS PCT: 29 % (ref 12–46)
Lymphs Abs: 2 10*3/uL (ref 0.7–4.0)
MCH: 31 pg (ref 26.0–34.0)
MCHC: 34.5 g/dL (ref 30.0–36.0)
MCV: 89.8 fL (ref 78.0–100.0)
MONOS PCT: 9 % (ref 3–12)
Monocytes Absolute: 0.6 10*3/uL (ref 0.1–1.0)
NEUTROS ABS: 4.1 10*3/uL (ref 1.7–7.7)
Neutrophils Relative %: 61 % (ref 43–77)
Platelets: 184 10*3/uL (ref 150–400)
RBC: 4.42 MIL/uL (ref 3.87–5.11)
RDW: 13.5 % (ref 11.5–15.5)
WBC: 6.8 10*3/uL (ref 4.0–10.5)

## 2014-09-30 LAB — URINE MICROSCOPIC-ADD ON

## 2014-09-30 NOTE — ED Provider Notes (Signed)
CSN: [id number]     Arrival date & time 09/30/14  1346 History   First MD Initiated Contact with Patient 09/30/14 1747     Chief Complaint  Patient presents with  . Fall     (Consider location/radiation/quality/duration/timing/severity/associated sxs/prior Treatment) The history is provided by the patient and medical records.    This is an 80 y.o. F with PMH significant for HTN, AFIB on eliquis, hyperlipidemia, presenting to the ED for a fall.  Patient states last week she was walking her dog at the park, another larger dog came along and knocked her down causing her to fall, and striking her head against a sewer grate. She denies loss of consciousness. States since this time she has been having intermittent headaches and episodes of where she feels as though her vision is crossing/room is spinning.  Denies any other falls or injuries.  Husband states she has some baseline confusion, seems to have escalated since fall.  On arrival, patient alert and oriented to self and place, some confusion regarding date.  She only complains of headache and posterior neck pain at present.  Of note, patient has a very low tolerance for pain medication-- husband gave her half of an extra strength tylenol which made her somnolent for several hours so has been skeptical to take further meds for headache.  Denies fever, chills.  Past Medical History  Diagnosis Date  . Hypertension   . Atrial fibrillation 12/2012  . Current use of long term anticoagulation 12/2012    Eliquis  . Mild cognitive impairment   . Cataracts, both eyes 2005, 2013    Cataract extraction/IOL implant  . Diverticulosis 2008  . Hyponatremia     SIADH  . Hyperlipidemia   . Rosacea   . Allergy   . Osteopenia   . Fracture of scaphoid bone of hand   . Arrhythmia     atrial fib  . Squamous cell carcinoma   . Actinic keratoses   . Basal cell carcinoma of skin    Past Surgical History  Procedure Laterality Date  . Cesarean section     . Cataracts Bilateral    History reviewed. No pertinent family history. History  Substance Use Topics  . Smoking status: Former Research scientist (life sciences)  . Smokeless tobacco: Not on file  . Alcohol Use: No   OB History    No data available     Review of Systems  Neurological: Positive for headaches.  All other systems reviewed and are negative.     Allergies  Aricept; Ciprofloxacin; Latex; Macrobid; Macrodantin; Other; Strawberry; and Tramadol  Home Medications   Prior to Admission medications   Medication Sig Start Date End Date Taking? Authorizing Provider  apixaban (ELIQUIS) 2.5 MG TABS tablet Take 1 tablet (2.5 mg total) by mouth 2 (two) times daily. 01/04/13   Sharyon Cable, MD  CALCIUM PO Take 1 tablet by mouth daily.    Historical Provider, MD  cefUROXime (CEFTIN) 500 MG tablet Take 1 tablet (500 mg total) by mouth 2 (two) times daily with a meal. 09/07/14   Delfina Redwood, MD  fish oil-omega-3 fatty acids 1000 MG capsule Take 1 g by mouth 2 (two) times daily.     Historical Provider, MD  metoprolol succinate (TOPROL-XL) 25 MG 24 hr tablet Take 0.5 tablets (12.5 mg total) by mouth daily. HOLD DOSE TONIGHT 09/07/14   Delfina Redwood, MD  metoprolol tartrate (LOPRESSOR) 25 MG tablet Take 12.5-25 mg by mouth daily as needed (Take  1 tablet if HR 120 or greater. Take 1/2 tablet if HR 110-120).     Historical Provider, MD  Multiple Vitamins-Minerals (MULTIVITAMIN PO) Take 1 tablet by mouth daily.    Historical Provider, MD  sodium chloride 1 G tablet Take 1 g by mouth 2 (two) times daily with a meal.    Historical Provider, MD   BP 142/61 mmHg  Pulse 55  Temp(Src) 97.9 F (36.6 C) (Oral)  Resp 12  Ht 5' 3.5" (1.613 m)  Wt 115 lb (52.164 kg)  BMI 20.05 kg/m2  SpO2 100%   Physical Exam  Constitutional: She appears well-developed and well-nourished.  HENT:  Head: Normocephalic and atraumatic. Head is without raccoon's eyes, without Battle's sign, without abrasion, without  contusion and without laceration.  Mouth/Throat: Oropharynx is clear and moist.  No visible signs of head trauma, no scalp hematoma or skull deformity  Eyes: Conjunctivae and EOM are normal. Pupils are equal, round, and reactive to light.  EOMs intact without signs of entrapment; normal confrontation  Neck: Normal range of motion. Neck supple.  Cardiovascular: Normal rate, regular rhythm and normal heart sounds.   Pulmonary/Chest: Effort normal and breath sounds normal. No respiratory distress. She has no wheezes.  Abdominal: Soft. Bowel sounds are normal. There is no tenderness. There is no guarding.  Musculoskeletal: Normal range of motion.       Cervical back: She exhibits tenderness, bony tenderness and pain.  Cervical spine with mild tenderness, worse along left paraspinal region, no midline deformities or step-off, full range of motion maintained, normal strength and sensation of bilateral upper extremities  Neurological: She is alert.  Awake, alert, oriented to self and place, confused regarding date; equal strength upper and lower extremities bilaterally; normal sensation to touch throughout; some lightheadedness during Romberg but no unsteadiness or sway; normal gait  Skin: Skin is warm and dry.  Psychiatric: She has a normal mood and affect.  Nursing note and vitals reviewed.   ED Course  Procedures (including critical care time) Labs Review Labs Reviewed - No data to display  Imaging Review Ct Head Wo Contrast  09/30/2014   CLINICAL DATA:  80 year old female with fall 3 weeks ago hitting back of head on sidewalk. Now with confusion, dizziness, weakness and headache. History of high blood pressure. Subsequent encounter.  EXAM: CT HEAD WITHOUT CONTRAST  CT CERVICAL SPINE WITHOUT CONTRAST  TECHNIQUE: Multidetector CT imaging of the head and cervical spine was performed following the standard protocol without intravenous contrast. Multiplanar CT image reconstructions of the cervical  spine were also generated.  COMPARISON:  09/19/2014 cervical spine plain film exam. 03/31/2011 head CT  FINDINGS: CT HEAD FINDINGS  No skull fracture or intracranial hemorrhage.  Mild age related atrophy without hydrocephalus.  Small vessel disease type changes without CT evidence of large acute infarct.  No intracranial mass lesion noted on this unenhanced exam.  Vascular calcifications.  Mastoid air cells, middle ear cavities and visualized paranasal sinuses are clear.  Orbital structures unremarkable.  CT CERVICAL SPINE FINDINGS  No cervical spine fracture, malalignment or abnormal prevertebral soft tissue swelling.  Scattered mild degenerative changes throughout the cervical spine.  Minimal anterior slips C7 and T2 felt to be related to degenerative changes.  Scarring lung apices.  Carotid bifurcation calcification.  IMPRESSION: CT HEAD  No skull fracture or intracranial hemorrhage.  Mild age related atrophy without hydrocephalus.  Small vessel disease type changes without CT evidence of large acute infarct.  CT CERVICAL SPINE  No cervical  spine fracture, malalignment or abnormal prevertebral soft tissue swelling.  Scattered mild degenerative changes throughout the cervical spine.  Minimal anterior slips C7 and T2 felt to be related to degenerative changes   Electronically Signed   By: Chauncey Cruel M.D.   On: 09/30/2014 16:03   Ct Cervical Spine Wo Contrast  09/30/2014   CLINICAL DATA:  80 year old female with fall 3 weeks ago hitting back of head on sidewalk. Now with confusion, dizziness, weakness and headache. History of high blood pressure. Subsequent encounter.  EXAM: CT HEAD WITHOUT CONTRAST  CT CERVICAL SPINE WITHOUT CONTRAST  TECHNIQUE: Multidetector CT imaging of the head and cervical spine was performed following the standard protocol without intravenous contrast. Multiplanar CT image reconstructions of the cervical spine were also generated.  COMPARISON:  09/19/2014 cervical spine plain film exam.  03/31/2011 head CT  FINDINGS: CT HEAD FINDINGS  No skull fracture or intracranial hemorrhage.  Mild age related atrophy without hydrocephalus.  Small vessel disease type changes without CT evidence of large acute infarct.  No intracranial mass lesion noted on this unenhanced exam.  Vascular calcifications.  Mastoid air cells, middle ear cavities and visualized paranasal sinuses are clear.  Orbital structures unremarkable.  CT CERVICAL SPINE FINDINGS  No cervical spine fracture, malalignment or abnormal prevertebral soft tissue swelling.  Scattered mild degenerative changes throughout the cervical spine.  Minimal anterior slips C7 and T2 felt to be related to degenerative changes.  Scarring lung apices.  Carotid bifurcation calcification.  IMPRESSION: CT HEAD  No skull fracture or intracranial hemorrhage.  Mild age related atrophy without hydrocephalus.  Small vessel disease type changes without CT evidence of large acute infarct.  CT CERVICAL SPINE  No cervical spine fracture, malalignment or abnormal prevertebral soft tissue swelling.  Scattered mild degenerative changes throughout the cervical spine.  Minimal anterior slips C7 and T2 felt to be related to degenerative changes   Electronically Signed   By: Chauncey Cruel M.D.   On: 09/30/2014 16:03     EKG Interpretation None      MDM   Final diagnoses:  Closed head injury, subsequent encounter  Neck pain   80 y.o. F s/p fall with closed head injury 1 week ago.  On exam, patient awake and alert, some confusion regarding date.  Husband states confusion has been heightened since head injury.  CT head and cervical spine were obtained during triage, both negative for acute injuries.  Patient does have some lightheadedness during Romberg test, but no sway or unsteadiness.  Gait is non-ataxic.  On chart review, patient does have hx of UTI and SIADH.  Will obtain basic labs and u/a to ensure no other lab abnormalities which may be contributing to her  confusion.  Lab work is reassuring.  Patient's neurologic exam remains unchanged.  Long discussion with her and husband regarding head injuries and possibly concussive type symptoms which may continue for a while longer.  They are comfortable with discharge home at this time.  Encouraged close FU with PCP.  Husband will continue monitoring at home for any worsening symptoms.  Discussed plan with patient, he/she acknowledged understanding and agreed with plan of care.  Return precautions given for new or worsening symptoms.  Case discussed with attending physician, Dr. Roderic Palau, who personally evaluated patient and agrees with assessment and plan of care.  Larene Pickett, PA-C 09/30/14 [place], MD 10/01/14 [phone]

## 2014-09-30 NOTE — Discharge Instructions (Signed)
May continue low dose tylenol or motrin if needed for headache/neck pain. Follow-up with your primary care physician. Return to the ED for new concerns-- worsening confusion, frequent falls, etc.

## 2014-09-30 NOTE — ED Notes (Signed)
Pt had fall last week and having continued neck and back of head pain; pt with hx of afib and takes blood thinners; pt sent from PCP for further eval

## 2014-09-30 NOTE — ED Notes (Signed)
PA at BS.  

## 2014-10-05 DIAGNOSIS — R350 Frequency of micturition: Secondary | ICD-10-CM | POA: Diagnosis not present

## 2014-11-05 DIAGNOSIS — H52203 Unspecified astigmatism, bilateral: Secondary | ICD-10-CM | POA: Diagnosis not present

## 2014-11-05 DIAGNOSIS — H04123 Dry eye syndrome of bilateral lacrimal glands: Secondary | ICD-10-CM | POA: Diagnosis not present

## 2014-11-06 DIAGNOSIS — N390 Urinary tract infection, site not specified: Secondary | ICD-10-CM | POA: Diagnosis not present

## 2014-11-20 DIAGNOSIS — N390 Urinary tract infection, site not specified: Secondary | ICD-10-CM | POA: Diagnosis not present

## 2014-11-26 DIAGNOSIS — M79609 Pain in unspecified limb: Secondary | ICD-10-CM | POA: Diagnosis not present

## 2014-11-26 DIAGNOSIS — R6889 Other general symptoms and signs: Secondary | ICD-10-CM | POA: Diagnosis not present

## 2014-11-26 DIAGNOSIS — E871 Hypo-osmolality and hyponatremia: Secondary | ICD-10-CM | POA: Diagnosis not present

## 2014-12-01 DIAGNOSIS — H3532 Exudative age-related macular degeneration: Secondary | ICD-10-CM | POA: Diagnosis not present

## 2014-12-10 DIAGNOSIS — F0390 Unspecified dementia without behavioral disturbance: Secondary | ICD-10-CM | POA: Diagnosis not present

## 2014-12-10 DIAGNOSIS — K5790 Diverticulosis of intestine, part unspecified, without perforation or abscess without bleeding: Secondary | ICD-10-CM | POA: Insufficient documentation

## 2014-12-10 DIAGNOSIS — Z87891 Personal history of nicotine dependence: Secondary | ICD-10-CM | POA: Diagnosis not present

## 2014-12-10 DIAGNOSIS — R441 Visual hallucinations: Secondary | ICD-10-CM | POA: Diagnosis not present

## 2014-12-10 DIAGNOSIS — M8580 Other specified disorders of bone density and structure, unspecified site: Secondary | ICD-10-CM | POA: Insufficient documentation

## 2014-12-10 DIAGNOSIS — E042 Nontoxic multinodular goiter: Secondary | ICD-10-CM | POA: Insufficient documentation

## 2014-12-10 DIAGNOSIS — I482 Chronic atrial fibrillation: Secondary | ICD-10-CM | POA: Diagnosis not present

## 2014-12-10 DIAGNOSIS — G309 Alzheimer's disease, unspecified: Secondary | ICD-10-CM | POA: Diagnosis not present

## 2014-12-10 DIAGNOSIS — F0280 Dementia in other diseases classified elsewhere without behavioral disturbance: Secondary | ICD-10-CM | POA: Diagnosis not present

## 2014-12-10 DIAGNOSIS — I10 Essential (primary) hypertension: Secondary | ICD-10-CM | POA: Diagnosis not present

## 2015-01-06 DIAGNOSIS — I10 Essential (primary) hypertension: Secondary | ICD-10-CM | POA: Diagnosis not present

## 2015-01-06 DIAGNOSIS — I480 Paroxysmal atrial fibrillation: Secondary | ICD-10-CM | POA: Diagnosis not present

## 2015-01-06 DIAGNOSIS — Z7901 Long term (current) use of anticoagulants: Secondary | ICD-10-CM | POA: Diagnosis not present

## 2015-01-15 DIAGNOSIS — N390 Urinary tract infection, site not specified: Secondary | ICD-10-CM | POA: Diagnosis not present

## 2015-01-20 ENCOUNTER — Encounter: Payer: Self-pay | Admitting: Physician Assistant

## 2015-01-21 ENCOUNTER — Encounter: Payer: Self-pay | Admitting: *Deleted

## 2015-01-27 ENCOUNTER — Encounter: Payer: Self-pay | Admitting: Physician Assistant

## 2015-01-27 ENCOUNTER — Other Ambulatory Visit (INDEPENDENT_AMBULATORY_CARE_PROVIDER_SITE_OTHER): Payer: Medicare Other

## 2015-01-27 ENCOUNTER — Ambulatory Visit (INDEPENDENT_AMBULATORY_CARE_PROVIDER_SITE_OTHER): Payer: Medicare Other | Admitting: Physician Assistant

## 2015-01-27 VITALS — BP 144/60 | HR 56 | Ht 63.0 in | Wt 117.6 lb

## 2015-01-27 DIAGNOSIS — K921 Melena: Secondary | ICD-10-CM | POA: Diagnosis not present

## 2015-01-27 DIAGNOSIS — R197 Diarrhea, unspecified: Secondary | ICD-10-CM

## 2015-01-27 LAB — CBC WITH DIFFERENTIAL/PLATELET
BASOS ABS: 0 10*3/uL (ref 0.0–0.1)
BASOS PCT: 0.6 % (ref 0.0–3.0)
EOS PCT: 2.1 % (ref 0.0–5.0)
Eosinophils Absolute: 0.1 10*3/uL (ref 0.0–0.7)
HCT: 38.8 % (ref 36.0–46.0)
HEMOGLOBIN: 13.1 g/dL (ref 12.0–15.0)
Lymphocytes Relative: 41.2 % (ref 12.0–46.0)
Lymphs Abs: 1.9 10*3/uL (ref 0.7–4.0)
MCHC: 33.7 g/dL (ref 30.0–36.0)
MCV: 92.1 fl (ref 78.0–100.0)
Monocytes Absolute: 0.5 10*3/uL (ref 0.1–1.0)
Monocytes Relative: 10 % (ref 3.0–12.0)
NEUTROS ABS: 2.2 10*3/uL (ref 1.4–7.7)
Neutrophils Relative %: 46.1 % (ref 43.0–77.0)
PLATELETS: 188 10*3/uL (ref 150.0–400.0)
RBC: 4.22 Mil/uL (ref 3.87–5.11)
RDW: 13.8 % (ref 11.5–15.5)
WBC: 4.7 10*3/uL (ref 4.0–10.5)

## 2015-01-27 NOTE — Progress Notes (Signed)
Patient ID: Cathy Dickerson, female   DOB: [birth year], 81 y.o.   MRN: [id number]   Subjective:    Patient ID: Cathy Dickerson, female    DOB: May 21, 1927, 81 y.o.   MRN: [id number]  HPI [name] is a pleasant 81 year old white female referred today by Dr. Chapman Fitch for evaluation of black stool. Patient had been known to Dr. Rachelle Hora in  the past and had undergone colonoscopy in 2006 which showed left-sided diverticulosis only. Patient has history of hypertension, atrial fibrillation for which she is maintained on eliquis's, recurrent urinary tract infections and has a diagnosis of mild cognitive impairment for which she is been on Aricept over the past couple of months. Patient has apparently been treated for a urinary tract infection within the past couple of weeks and was on Septra DS. She states that she has been noticing very dark stools over the past few weeks. She has been having 1 bowel movement per day has not had any abdominal pain or cramping. No rectal pain or discomfort. Her appetite has been fair, her weight has been stable. Apparently last night at about 3 AM she had an episode of incontinence which was very loose stool and then had 2 episodes of diarrhea after that. Her husband and she  still feel that her stool has been dark. She has not been taking any Pepto-Bismol no aspirin or NSAIDs. Last labs in November 2015 hemoglobin was 13.7.  Review of Systems Pertinent positive and negative review of systems were noted in the above HPI section.  All other review of systems was otherwise negative.  Outpatient Encounter Prescriptions as of 01/27/2015  Medication Sig  . apixaban (ELIQUIS) 2.5 MG TABS tablet Take 1 tablet (2.5 mg total) by mouth 2 (two) times daily.  . cholecalciferol (VITAMIN D) 1000 UNITS tablet Take 1,000 Units by mouth daily.  Marland Kitchen donepezil (ARICEPT) 5 MG tablet Take 2.5 mg by mouth at bedtime.  . metoprolol succinate (TOPROL-XL) 25 MG 24 hr tablet Take 0.5 tablets (12.5 mg total)  by mouth daily. HOLD DOSE TONIGHT (Patient taking differently: Take 12.5 mg by mouth daily. )  . metoprolol tartrate (LOPRESSOR) 25 MG tablet Take 12.5-25 mg by mouth daily as needed (Take 1 tablet if HR 120 or greater. Take 1/2 tablet if HR 110-120).   . Multiple Minerals (CALCIUM-MAGNESIUM-ZINC) TABS Take 1 tablet by mouth daily.  . Multiple Vitamins-Minerals (MULTIVITAMIN PO) Take 1 tablet by mouth daily.  . sodium chloride 1 G tablet Take 1 g by mouth 2 (two) times daily with a meal.  . [DISCONTINUED] CALCIUM PO Take 1 tablet by mouth daily.  . [DISCONTINUED] cefUROXime (CEFTIN) 500 MG tablet Take 1 tablet (500 mg total) by mouth 2 (two) times daily with a meal.  . [DISCONTINUED] fish oil-omega-3 fatty acids 1000 MG capsule Take 1 g by mouth 2 (two) times daily.   . [DISCONTINUED] FLUARIX QUADRIVALENT 0.5 ML injection Inject 1 Dose as directed once.   Allergies  Allergen Reactions  . Ciprofloxacin Other (See Comments)    Reaction unknown  . Latex Other (See Comments)    Reaction unknown  . Macrobid [Nitrofurantoin Macrocrystal] Other (See Comments)    Reaction unknown  . Macrodantin [Nitrofurantoin] Other (See Comments)    Upset stomach  . Other Other (See Comments)    Popcorn- Diverticulosis  . Strawberry Other (See Comments)    Diverticulosis   . Tramadol Other (See Comments)    Reaction unknown   Patient Active Problem List  Diagnosis Date Noted  . Generalized weakness 09/06/2014  . UTI (lower urinary tract infection) 09/06/2014  . Hypertension   . Mild cognitive impairment   . Atrial fibrillation 12/22/2012  . Current use of long term anticoagulation 12/22/2012   History   Social History  . Marital Status: Married    Spouse Name: N/A  . Number of Children: 3  . Years of Education: N/A   Occupational History  . Retired    Social History Main Topics  . Smoking status: Former Research scientist (life sciences)  . Smokeless tobacco: Never Used  . Alcohol Use: 0.0 oz/week    0 Standard  drinks or equivalent per week     Comment: 1 drink daily  . Drug Use: No  . Sexual Activity: Not on file   Other Topics Concern  . Not on file   Social History Narrative   Patient is Married Research scientist (physical sciences)), homemaker. Lives in a single level home, Independent Living section at [facility] since 2013. Lives with spouse, pet dog (Manson)   Manderson smoking prior to 1980, No  Alcohol history   Patient has  no Advanced planning documents.    Ms. Tomassi family history includes Breast cancer in her daughter and mother; Colon cancer in her sister; Colon polyps in her sister. There is no history of Kidney disease, Liver disease, Esophageal cancer, or Pancreatic cancer.      Objective:    Filed Vitals:   01/27/15 1025  BP: 144/60  Pulse: 56    Physical Exam  well-developed elderly white female in no acute distress, accompanied by her husband- blood pressure 144/60 pulse 56 height 5 foot 3 weight 117. HEENT; nontraumatic normocephalic EOMI PERRLA sclera anicteric, Supple; no JVD, Cardiovascular; regular rate and rhythm with S1-S2, Pulmonary clear bilaterally, Abdomen; soft, bowel sounds are present nontender nondistended no palpable mass or hepatosplenomegaly she does have a hysterectomy incisional scar, Rectal ;exam no external lesions noted stool is Guerrero and Hemoccult negative, Extremities ;no clubbing cyanosis or edema skin warm and dry, Psych; mood and affect appropriate, she does look to her husband to help her answer questions.       Assessment & Plan:   #1 81 yo female with dark stool x 2 weeks - stool is Nine and heme negative today and husband says she did a hemocult 2 weeks ago which was also negative. No evidence for GI bleeding- ? medication induced change in stool color #2 Diarrhea- onset earlier today- pt just completes a course of antibiotic -  If persistes will r/o cdiff #3 mild demntia #4 Atrial fib #5 chronic anticoagulation #6 recurrent UTI's #7  diverticulosis  Plan; Repeat cbc today  Stool for cdiff if diarrhea persists -(They will take specimen container home with them today) Brat diet x 24 hours  Pt will be established with Dr.Stark  Alfredia Ferguson PA-C 01/27/2015   Cc: Antony Blackbird, MD

## 2015-01-27 NOTE — Progress Notes (Signed)
Reviewed and agree with management plan.  Hargis Vandyne T. Areon Cocuzza, MD FACG 

## 2015-01-27 NOTE — Patient Instructions (Signed)
Please go to the basement level to have your labs drawn.   Stay on a Bland/Brat diet for the next 24 to 48 hours.

## 2015-02-05 ENCOUNTER — Encounter: Payer: Self-pay | Admitting: Gastroenterology

## 2015-02-05 LAB — HM COLONOSCOPY

## 2015-03-02 DIAGNOSIS — N390 Urinary tract infection, site not specified: Secondary | ICD-10-CM | POA: Diagnosis not present

## 2015-03-03 DIAGNOSIS — M545 Low back pain: Secondary | ICD-10-CM | POA: Diagnosis not present

## 2015-03-03 DIAGNOSIS — N390 Urinary tract infection, site not specified: Secondary | ICD-10-CM | POA: Diagnosis not present

## 2015-03-05 ENCOUNTER — Other Ambulatory Visit: Payer: Self-pay | Admitting: Family Medicine

## 2015-03-05 ENCOUNTER — Ambulatory Visit
Admission: RE | Admit: 2015-03-05 | Discharge: 2015-03-05 | Disposition: A | Discharge status: Home / Self Care | Payer: Medicare Other | Source: Ambulatory Visit | Attending: Family Medicine | Admitting: Family Medicine

## 2015-03-05 DIAGNOSIS — M546 Pain in thoracic spine: Secondary | ICD-10-CM | POA: Diagnosis not present

## 2015-03-05 DIAGNOSIS — M545 Low back pain, unspecified: Secondary | ICD-10-CM

## 2015-03-05 DIAGNOSIS — M5137 Other intervertebral disc degeneration, lumbosacral region: Secondary | ICD-10-CM | POA: Diagnosis not present

## 2015-03-05 DIAGNOSIS — M4186 Other forms of scoliosis, lumbar region: Secondary | ICD-10-CM | POA: Diagnosis not present

## 2015-03-12 DIAGNOSIS — N390 Urinary tract infection, site not specified: Secondary | ICD-10-CM | POA: Diagnosis not present

## 2015-03-23 DIAGNOSIS — L820 Inflamed seborrheic keratosis: Secondary | ICD-10-CM | POA: Diagnosis not present

## 2015-03-23 DIAGNOSIS — L821 Other seborrheic keratosis: Secondary | ICD-10-CM | POA: Diagnosis not present

## 2015-03-23 DIAGNOSIS — L570 Actinic keratosis: Secondary | ICD-10-CM | POA: Diagnosis not present

## 2015-03-23 DIAGNOSIS — D225 Melanocytic nevi of trunk: Secondary | ICD-10-CM | POA: Diagnosis not present

## 2015-03-23 DIAGNOSIS — Z85828 Personal history of other malignant neoplasm of skin: Secondary | ICD-10-CM | POA: Diagnosis not present

## 2015-03-27 DIAGNOSIS — M542 Cervicalgia: Secondary | ICD-10-CM | POA: Diagnosis not present

## 2015-03-27 DIAGNOSIS — M546 Pain in thoracic spine: Secondary | ICD-10-CM | POA: Diagnosis not present

## 2015-04-01 DIAGNOSIS — M542 Cervicalgia: Secondary | ICD-10-CM | POA: Diagnosis not present

## 2015-04-01 DIAGNOSIS — M546 Pain in thoracic spine: Secondary | ICD-10-CM | POA: Diagnosis not present

## 2015-04-02 DIAGNOSIS — M542 Cervicalgia: Secondary | ICD-10-CM | POA: Diagnosis not present

## 2015-04-02 DIAGNOSIS — M546 Pain in thoracic spine: Secondary | ICD-10-CM | POA: Diagnosis not present

## 2015-04-03 DIAGNOSIS — M542 Cervicalgia: Secondary | ICD-10-CM | POA: Diagnosis not present

## 2015-04-03 DIAGNOSIS — M546 Pain in thoracic spine: Secondary | ICD-10-CM | POA: Diagnosis not present

## 2015-04-08 DIAGNOSIS — M546 Pain in thoracic spine: Secondary | ICD-10-CM | POA: Diagnosis not present

## 2015-04-08 DIAGNOSIS — M542 Cervicalgia: Secondary | ICD-10-CM | POA: Diagnosis not present

## 2015-04-10 DIAGNOSIS — M546 Pain in thoracic spine: Secondary | ICD-10-CM | POA: Diagnosis not present

## 2015-04-10 DIAGNOSIS — M542 Cervicalgia: Secondary | ICD-10-CM | POA: Diagnosis not present

## 2015-04-28 ENCOUNTER — Other Ambulatory Visit: Payer: Self-pay | Admitting: Family Medicine

## 2015-04-28 DIAGNOSIS — R5383 Other fatigue: Secondary | ICD-10-CM | POA: Diagnosis not present

## 2015-04-28 DIAGNOSIS — R319 Hematuria, unspecified: Secondary | ICD-10-CM | POA: Diagnosis not present

## 2015-04-28 DIAGNOSIS — R1011 Right upper quadrant pain: Secondary | ICD-10-CM

## 2015-04-28 DIAGNOSIS — N390 Urinary tract infection, site not specified: Secondary | ICD-10-CM | POA: Diagnosis not present

## 2015-04-28 DIAGNOSIS — Z8719 Personal history of other diseases of the digestive system: Secondary | ICD-10-CM | POA: Diagnosis not present

## 2015-04-28 DIAGNOSIS — R109 Unspecified abdominal pain: Secondary | ICD-10-CM | POA: Diagnosis not present

## 2015-04-28 LAB — BASIC METABOLIC PANEL
BUN: 24 mg/dL — AB (ref 4–21)
BUN: 24 mg/dL — AB (ref 4–21)
CREATININE: 0.8 mg/dL (ref 0.5–1.1)
CREATININE: 0.8 mg/dL (ref 0.5–1.1)
Glucose: 86 mg/dL
Glucose: 86 mg/dL
POTASSIUM: 4.4 mmol/L (ref 3.4–5.3)
POTASSIUM: 4.4 mmol/L (ref 3.4–5.3)
SODIUM: 134 mmol/L — AB (ref 137–147)
SODIUM: 134 mmol/L — AB (ref 137–147)

## 2015-04-28 LAB — CBC AND DIFFERENTIAL
HCT: 40 % (ref 36–46)
Hemoglobin: 13.4 g/dL (ref 12.0–16.0)
Platelets: 177 10*3/uL (ref 150–399)
WBC: 5.6 10^3/mL

## 2015-05-05 ENCOUNTER — Ambulatory Visit
Admission: RE | Admit: 2015-05-05 | Discharge: 2015-05-05 | Disposition: A | Discharge status: Home / Self Care | Payer: Medicare Other | Source: Ambulatory Visit | Attending: Family Medicine | Admitting: Family Medicine

## 2015-05-05 DIAGNOSIS — R1011 Right upper quadrant pain: Secondary | ICD-10-CM

## 2015-05-07 ENCOUNTER — Other Ambulatory Visit: Payer: Medicare Other

## 2015-05-11 ENCOUNTER — Telehealth: Payer: Self-pay | Admitting: Gastroenterology

## 2015-05-11 NOTE — Telephone Encounter (Signed)
Patient is scheduled for 06/03/15 3:00. Husband is notified of the appt date and time

## 2015-05-13 ENCOUNTER — Other Ambulatory Visit: Payer: Self-pay | Admitting: Family Medicine

## 2015-05-13 DIAGNOSIS — R1084 Generalized abdominal pain: Secondary | ICD-10-CM

## 2015-05-14 ENCOUNTER — Ambulatory Visit
Admission: RE | Admit: 2015-05-14 | Discharge: 2015-05-14 | Disposition: A | Discharge status: Home / Self Care | Payer: Medicare Other | Source: Ambulatory Visit | Attending: Family Medicine | Admitting: Family Medicine

## 2015-05-14 DIAGNOSIS — R1031 Right lower quadrant pain: Secondary | ICD-10-CM | POA: Diagnosis not present

## 2015-05-14 DIAGNOSIS — K5730 Diverticulosis of large intestine without perforation or abscess without bleeding: Secondary | ICD-10-CM | POA: Diagnosis not present

## 2015-05-14 DIAGNOSIS — R1084 Generalized abdominal pain: Secondary | ICD-10-CM

## 2015-05-14 MED ORDER — IOPAMIDOL (ISOVUE-300) INJECTION 61%
100.0000 mL | Freq: Once | INTRAVENOUS | Status: AC | PRN
  Administered 2015-05-14: 100 mL via INTRAVENOUS

## 2015-05-20 DIAGNOSIS — R10819 Abdominal tenderness, unspecified site: Secondary | ICD-10-CM | POA: Diagnosis not present

## 2015-05-20 DIAGNOSIS — F329 Major depressive disorder, single episode, unspecified: Secondary | ICD-10-CM | POA: Diagnosis not present

## 2015-05-20 DIAGNOSIS — R441 Visual hallucinations: Secondary | ICD-10-CM | POA: Diagnosis not present

## 2015-05-20 DIAGNOSIS — I10 Essential (primary) hypertension: Secondary | ICD-10-CM | POA: Diagnosis not present

## 2015-05-20 DIAGNOSIS — G301 Alzheimer's disease with late onset: Secondary | ICD-10-CM | POA: Diagnosis not present

## 2015-05-20 DIAGNOSIS — F0280 Dementia in other diseases classified elsewhere without behavioral disturbance: Secondary | ICD-10-CM | POA: Diagnosis not present

## 2015-05-20 DIAGNOSIS — E871 Hypo-osmolality and hyponatremia: Secondary | ICD-10-CM | POA: Diagnosis not present

## 2015-05-20 DIAGNOSIS — I4891 Unspecified atrial fibrillation: Secondary | ICD-10-CM | POA: Diagnosis not present

## 2015-05-20 DIAGNOSIS — R5382 Chronic fatigue, unspecified: Secondary | ICD-10-CM | POA: Diagnosis not present

## 2015-05-21 DIAGNOSIS — I480 Paroxysmal atrial fibrillation: Secondary | ICD-10-CM | POA: Diagnosis not present

## 2015-05-21 DIAGNOSIS — F32A Depression, unspecified: Secondary | ICD-10-CM | POA: Insufficient documentation

## 2015-05-21 DIAGNOSIS — G309 Alzheimer's disease, unspecified: Secondary | ICD-10-CM

## 2015-05-21 DIAGNOSIS — F0280 Dementia in other diseases classified elsewhere without behavioral disturbance: Secondary | ICD-10-CM

## 2015-05-21 DIAGNOSIS — Z7901 Long term (current) use of anticoagulants: Secondary | ICD-10-CM | POA: Diagnosis not present

## 2015-05-21 DIAGNOSIS — F329 Major depressive disorder, single episode, unspecified: Secondary | ICD-10-CM | POA: Insufficient documentation

## 2015-05-21 DIAGNOSIS — I10 Essential (primary) hypertension: Secondary | ICD-10-CM | POA: Diagnosis not present

## 2015-05-21 DIAGNOSIS — R42 Dizziness and giddiness: Secondary | ICD-10-CM | POA: Diagnosis not present

## 2015-05-21 HISTORY — DX: Alzheimer's disease, unspecified: G30.9

## 2015-05-21 HISTORY — DX: Dementia in other diseases classified elsewhere, unspecified severity, without behavioral disturbance, psychotic disturbance, mood disturbance, and anxiety: F02.80

## 2015-05-22 ENCOUNTER — Other Ambulatory Visit (HOSPITAL_COMMUNITY): Payer: Self-pay | Admitting: Family Medicine

## 2015-05-22 DIAGNOSIS — Z1231 Encounter for screening mammogram for malignant neoplasm of breast: Secondary | ICD-10-CM

## 2015-05-25 ENCOUNTER — Emergency Department (INDEPENDENT_AMBULATORY_CARE_PROVIDER_SITE_OTHER)
Admission: EM | Admit: 2015-05-25 | Discharge: 2015-05-25 | Disposition: A | Discharge status: Nursing Home (Includes ICF) | Payer: Medicare Other | Source: Home / Self Care | Attending: Family Medicine | Admitting: Family Medicine

## 2015-05-25 ENCOUNTER — Encounter (HOSPITAL_COMMUNITY): Payer: Self-pay | Admitting: Emergency Medicine

## 2015-05-25 ENCOUNTER — Emergency Department (HOSPITAL_COMMUNITY): Payer: Medicare Other

## 2015-05-25 ENCOUNTER — Emergency Department (HOSPITAL_COMMUNITY)
Admission: EM | Admit: 2015-05-25 | Discharge: 2015-05-25 | Disposition: A | Discharge status: Home / Self Care | Payer: Medicare Other | Attending: Emergency Medicine | Admitting: Emergency Medicine

## 2015-05-25 DIAGNOSIS — M25572 Pain in left ankle and joints of left foot: Secondary | ICD-10-CM | POA: Diagnosis not present

## 2015-05-25 DIAGNOSIS — R531 Weakness: Secondary | ICD-10-CM | POA: Diagnosis not present

## 2015-05-25 DIAGNOSIS — I9589 Other hypotension: Secondary | ICD-10-CM

## 2015-05-25 DIAGNOSIS — Z79899 Other long term (current) drug therapy: Secondary | ICD-10-CM | POA: Diagnosis not present

## 2015-05-25 DIAGNOSIS — I480 Paroxysmal atrial fibrillation: Secondary | ICD-10-CM

## 2015-05-25 DIAGNOSIS — Z8669 Personal history of other diseases of the nervous system and sense organs: Secondary | ICD-10-CM | POA: Diagnosis not present

## 2015-05-25 DIAGNOSIS — Z7902 Long term (current) use of antithrombotics/antiplatelets: Secondary | ICD-10-CM | POA: Diagnosis not present

## 2015-05-25 DIAGNOSIS — Z7901 Long term (current) use of anticoagulants: Secondary | ICD-10-CM | POA: Insufficient documentation

## 2015-05-25 DIAGNOSIS — Z8659 Personal history of other mental and behavioral disorders: Secondary | ICD-10-CM | POA: Diagnosis not present

## 2015-05-25 DIAGNOSIS — J439 Emphysema, unspecified: Secondary | ICD-10-CM | POA: Diagnosis not present

## 2015-05-25 DIAGNOSIS — E785 Hyperlipidemia, unspecified: Secondary | ICD-10-CM | POA: Insufficient documentation

## 2015-05-25 DIAGNOSIS — Z87891 Personal history of nicotine dependence: Secondary | ICD-10-CM | POA: Diagnosis not present

## 2015-05-25 DIAGNOSIS — Z85828 Personal history of other malignant neoplasm of skin: Secondary | ICD-10-CM | POA: Insufficient documentation

## 2015-05-25 DIAGNOSIS — I4891 Unspecified atrial fibrillation: Secondary | ICD-10-CM | POA: Diagnosis not present

## 2015-05-25 DIAGNOSIS — Z8781 Personal history of (healed) traumatic fracture: Secondary | ICD-10-CM | POA: Insufficient documentation

## 2015-05-25 DIAGNOSIS — J8410 Pulmonary fibrosis, unspecified: Secondary | ICD-10-CM | POA: Diagnosis not present

## 2015-05-25 DIAGNOSIS — I10 Essential (primary) hypertension: Secondary | ICD-10-CM | POA: Insufficient documentation

## 2015-05-25 DIAGNOSIS — R001 Bradycardia, unspecified: Secondary | ICD-10-CM | POA: Diagnosis not present

## 2015-05-25 DIAGNOSIS — Z91040 Latex allergy status: Secondary | ICD-10-CM | POA: Insufficient documentation

## 2015-05-25 DIAGNOSIS — I959 Hypotension, unspecified: Secondary | ICD-10-CM | POA: Diagnosis present

## 2015-05-25 DIAGNOSIS — Z872 Personal history of diseases of the skin and subcutaneous tissue: Secondary | ICD-10-CM | POA: Insufficient documentation

## 2015-05-25 LAB — URINE MICROSCOPIC-ADD ON

## 2015-05-25 LAB — CBC WITH DIFFERENTIAL/PLATELET
BASOS ABS: 0 10*3/uL (ref 0.0–0.1)
BASOS PCT: 0 % (ref 0–1)
EOS ABS: 0.1 10*3/uL (ref 0.0–0.7)
EOS PCT: 1 % (ref 0–5)
HCT: 40.4 % (ref 36.0–46.0)
HEMOGLOBIN: 13.7 g/dL (ref 12.0–15.0)
LYMPHS PCT: 31 % (ref 12–46)
Lymphs Abs: 2 10*3/uL (ref 0.7–4.0)
MCH: 30.7 pg (ref 26.0–34.0)
MCHC: 33.9 g/dL (ref 30.0–36.0)
MCV: 90.6 fL (ref 78.0–100.0)
MONOS PCT: 7 % (ref 3–12)
Monocytes Absolute: 0.5 10*3/uL (ref 0.1–1.0)
NEUTROS PCT: 61 % (ref 43–77)
Neutro Abs: 4 10*3/uL (ref 1.7–7.7)
PLATELETS: 210 10*3/uL (ref 150–400)
RBC: 4.46 MIL/uL (ref 3.87–5.11)
RDW: 13.5 % (ref 11.5–15.5)
WBC: 6.6 10*3/uL (ref 4.0–10.5)

## 2015-05-25 LAB — I-STAT TROPONIN, ED
Troponin i, poc: 0.08 ng/mL (ref 0.00–0.08)
Troponin i, poc: 0.07 ng/mL (ref 0.00–0.08)

## 2015-05-25 LAB — COMPREHENSIVE METABOLIC PANEL
ALBUMIN: 3.7 g/dL (ref 3.5–5.0)
ALK PHOS: 57 U/L (ref 38–126)
ALT: 17 U/L (ref 14–54)
AST: 22 U/L (ref 15–41)
Anion gap: 9 (ref 5–15)
BILIRUBIN TOTAL: 0.5 mg/dL (ref 0.3–1.2)
BUN: 19 mg/dL (ref 6–20)
CALCIUM: 9.1 mg/dL (ref 8.9–10.3)
CHLORIDE: 97 mmol/L — AB (ref 101–111)
CO2: 25 mmol/L (ref 22–32)
Creatinine, Ser: 0.92 mg/dL (ref 0.44–1.00)
GFR, EST NON AFRICAN AMERICAN: 55 mL/min — AB (ref >60)
GLUCOSE: 97 mg/dL (ref 65–99)
Potassium: 4.1 mmol/L (ref 3.5–5.1)
Sodium: 131 mmol/L — ABNORMAL LOW (ref 135–145)
TOTAL PROTEIN: 6.8 g/dL (ref 6.5–8.1)

## 2015-05-25 LAB — URINALYSIS, ROUTINE W REFLEX MICROSCOPIC
Bilirubin Urine: NEGATIVE
Glucose, UA: NEGATIVE mg/dL
Hgb urine dipstick: NEGATIVE
Ketones, ur: 15 mg/dL — AB
NITRITE: NEGATIVE
PH: 5 (ref 5.0–8.0)
Protein, ur: NEGATIVE mg/dL
SPECIFIC GRAVITY, URINE: 1.022 (ref 1.005–1.030)
Urobilinogen, UA: 0.2 mg/dL (ref 0.0–1.0)

## 2015-05-25 LAB — I-STAT CG4 LACTIC ACID, ED: LACTIC ACID, VENOUS: 1.35 mmol/L (ref 0.5–2.0)

## 2015-05-25 LAB — PROTIME-INR
INR: 1.12 (ref 0.00–1.49)
PROTHROMBIN TIME: 14.6 s (ref 11.6–15.2)

## 2015-05-25 MED ORDER — SODIUM CHLORIDE 0.9 % IV BOLUS (SEPSIS)
500.0000 mL | Freq: Once | INTRAVENOUS | Status: AC
  Administered 2015-05-25: 500 mL via INTRAVENOUS

## 2015-05-25 NOTE — ED Provider Notes (Signed)
CSN: [id number]     Arrival date & time 05/25/15  1612 History   First MD Initiated Contact with Patient 05/25/15 1743     Chief Complaint  Patient presents with  . Tachycardia  . Hypotension   (Consider location/radiation/quality/duration/timing/severity/associated sxs/prior Treatment) HPI Comments: 81 year old female with a history of PAF had an episode of atrial fib last evening where she was feeling uncomfortable with pounding in the chest and legs and feet hurting. This continued during most of the night. Around lunchtime today her husband started taking her blood pressure readings in obtaining blood pressure such a 62/42 and 55 heart rate. Other blood pressures were in the 60, 70s and 80s. The heart rate also varied from 98 to the 50s A nurse told her that she needed to come to the urgent care for evaluation. She has a history of PAF, hypertension, current use of long-term anticoagulation, hyponatremia, dyslipidemia, arrhythmias, anxiety, depression. Her medication which is consistent Eliquis, metoprolol and Aricept. At this time she sitting in a chair, stable and denied chest pain or shortness of breath. She is alert and talkative with normal blood pressure at this time.    Past Medical History  Diagnosis Date  . Hypertension   . Atrial fibrillation 12/2012  . Current use of long term anticoagulation 12/2012    Eliquis  . Mild cognitive impairment   . Cataracts, both eyes 2005, 2013    Cataract extraction/IOL implant  . Diverticulosis 2008  . Hyponatremia     SIADH  . Hyperlipidemia   . Rosacea   . Allergy   . Osteopenia   . Fracture of scaphoid bone of hand   . Arrhythmia     atrial fib  . Squamous cell carcinoma   . Actinic keratoses   . Basal cell carcinoma of skin   . Anxiety   . Depression    Past Surgical History  Procedure Laterality Date  . Cesarean section      questionable  . Cataracts Bilateral   . Abdominal hysterectomy     Family History  Problem Relation  Age of Onset  . Breast cancer Mother   . Kidney disease Neg Hx   . Liver disease Neg Hx   . Colon cancer Sister   . Colon polyps Sister   . Esophageal cancer Neg Hx   . Pancreatic cancer Neg Hx   . Breast cancer Daughter     x 2   History  Substance Use Topics  . Smoking status: Former Research scientist (life sciences)  . Smokeless tobacco: Never Used  . Alcohol Use: 0.0 oz/week    0 Standard drinks or equivalent per week     Comment: 1 drink daily   OB History    No data available     Review of Systems  Constitutional: Positive for activity change and fatigue. Negative for fever.  HENT: Negative.   Respiratory: Negative for cough and shortness of breath.   Cardiovascular: Positive for leg swelling. Negative for chest pain.  Gastrointestinal: Negative.   Musculoskeletal: Negative.   Neurological: Positive for light-headedness. Negative for syncope and facial asymmetry.    Allergies  Ciprofloxacin; Latex; Macrobid; Macrodantin; Other; Strawberry; and Tramadol  Home Medications   Prior to Admission medications   Medication Sig Start Date End Date Taking? Authorizing Provider  apixaban (ELIQUIS) 2.5 MG TABS tablet Take 1 tablet (2.5 mg total) by mouth 2 (two) times daily. 01/04/13   Ripley Fraise, MD  cholecalciferol (VITAMIN D) 1000 UNITS tablet Take 1,000 Units  by mouth daily.    Historical Provider, MD  donepezil (ARICEPT) 5 MG tablet Take 2.5 mg by mouth at bedtime.    Historical Provider, MD  metoprolol succinate (TOPROL-XL) 25 MG 24 hr tablet Take 0.5 tablets (12.5 mg total) by mouth daily. HOLD DOSE TONIGHT Patient taking differently: Take 12.5 mg by mouth daily.  09/07/14   Delfina Redwood, MD  metoprolol tartrate (LOPRESSOR) 25 MG tablet Take 12.5-25 mg by mouth daily as needed (Take 1 tablet if HR 120 or greater. Take 1/2 tablet if HR 110-120).     Historical Provider, MD  Multiple Minerals (CALCIUM-MAGNESIUM-ZINC) TABS Take 1 tablet by mouth daily.    Historical Provider, MD  Multiple  Vitamins-Minerals (MULTIVITAMIN PO) Take 1 tablet by mouth daily.    Historical Provider, MD  sodium chloride 1 G tablet Take 1 g by mouth 2 (two) times daily with a meal.    Historical Provider, MD   Temp(Src) 97.3 F (36.3 C) (Oral)  SpO2 100% Physical Exam  Constitutional: She appears well-developed and well-nourished. No distress.  Neck: Normal range of motion. Neck supple.  Cardiovascular: Normal heart sounds.   Sinus bradycardia at a rate of 46.  Pulmonary/Chest: Effort normal and breath sounds normal. No respiratory distress. She has no wheezes. She has no rales.  Musculoskeletal: She exhibits no edema.  Neurological: She is alert. She exhibits normal muscle tone.  Skin: Skin is warm and dry. No erythema.  Psychiatric: Her behavior is normal.  Nursing note and vitals reviewed.   ED Course  Procedures (including critical care time) Labs Review Labs Reviewed - No data to display  Imaging Review No results found. ED ECG REPORT   Date: 05/25/2015  Rate: 46  Rhythm: sinus bradycardia  QRS Axis: normal  Intervals: normal  ST/T Wave abnormalities: normal  Conduction Disutrbances:none  Narrative Interpretation:   Old EKG Reviewed: none available  I have personally reviewed the EKG tracing and agree with the computerized printout as noted.   MDM   1. Sinus bradycardia   2. Paroxysmal atrial fibrillation   3. Other specified hypotension    Patient is being transferred to [place] for evaluation of variable heart rates and rhythms. Also for recent recordings of hypotension and borderline hypertension. Patient is stable at this time. Sinus bradycardia at 46.      Janne Napoleon, NP 05/25/15 [phone]

## 2015-05-25 NOTE — ED Notes (Signed)
Pt to Xray.

## 2015-05-25 NOTE — ED Notes (Signed)
Back from xray

## 2015-05-25 NOTE — Discharge Instructions (Signed)
Your evaluation including blood pressure has been normal here. If any symptoms return, especially low blood pressure, please return immediately to the emergency department. Otherwise, please recheck with your doctor tomorrow

## 2015-05-25 NOTE — ED Notes (Addendum)
Souse reported that pt.'s hypotension 72/36 at home this afternoon , sent here from [facility] urgent care for evaluation , denies pain or dis comfort , alert and oriented/respirations unlabored . Bradycardic at arrival 47/min.

## 2015-05-25 NOTE — ED Provider Notes (Signed)
CSN: [id number]     Arrival date & time 05/25/15  1846 History   First MD Initiated Contact with Patient 05/25/15 1942     Chief Complaint  Patient presents with  . Hypotension   Level V caveat secondary to poor historian and does not have recall of events of last night.  (Consider location/radiation/quality/duration/timing/severity/associated sxs/prior Treatment) HPI  History is obtained from patient and from her husband. 81 year old female who has been states complaint last night of discomfort in her chest. He states at that time they have the nurse in their facility come and check on her. She had not taken her beta blocker and she was given a dose of her beta blocker. She has a history of atrial fibrillation and the has been as having an irregular heartbeat at that time. At approximately midnight he noted her blood pressure be 117/94 with a heart rate of 101. After that she was given a dose of her beta blocker by the nurse. He rechecked her blood pressure in the early morning hours and it was low at 92/58 with a heart rate is 64. He states that later in the afternoon she felt generally weak and her blood pressure was down to 64/37. At 3:15 was noted to be 72/36 and the nurse told them they should go to the urgent care. Patient presented to urgent care and blood pressures taken there were 161/65, 143/61, and 153/68 with heart rates varying from 46-98. She currently is complaining of some pain in her foot but otherwise does not have any complaints currently. Her husband reports that she ate well today. She is on anticoagulation. No lateralized deficits have been noted. She has not had any fever, vomiting, or diarrhea. She was seen at the urgent care and sent here for further evaluation of plates.  Past Medical History  Diagnosis Date  . Hypertension   . Atrial fibrillation 12/2012  . Current use of long term anticoagulation 12/2012    Eliquis  . Mild cognitive impairment   . Cataracts, both eyes  2005, 2013    Cataract extraction/IOL implant  . Diverticulosis 2008  . Hyponatremia     SIADH  . Hyperlipidemia   . Rosacea   . Allergy   . Osteopenia   . Fracture of scaphoid bone of hand   . Arrhythmia     atrial fib  . Squamous cell carcinoma   . Actinic keratoses   . Basal cell carcinoma of skin   . Anxiety   . Depression    Past Surgical History  Procedure Laterality Date  . Cesarean section      questionable  . Cataracts Bilateral   . Abdominal hysterectomy     Family History  Problem Relation Age of Onset  . Breast cancer Mother   . Kidney disease Neg Hx   . Liver disease Neg Hx   . Colon cancer Sister   . Colon polyps Sister   . Esophageal cancer Neg Hx   . Pancreatic cancer Neg Hx   . Breast cancer Daughter     x 2   History  Substance Use Topics  . Smoking status: Former Research scientist (life sciences)  . Smokeless tobacco: Never Used  . Alcohol Use: 0.0 oz/week    0 Standard drinks or equivalent per week     Comment: 1 drink daily   OB History    No data available     Review of Systems  All other systems reviewed and are negative.  Allergies  Solifenacin; Ciprofloxacin; Latex; Macrobid; Macrodantin; Other; Strawberry; and Tramadol  Home Medications   Prior to Admission medications   Medication Sig Start Date End Date Taking? Authorizing Provider  apixaban (ELIQUIS) 2.5 MG TABS tablet Take 1 tablet (2.5 mg total) by mouth 2 (two) times daily. 01/04/13  Yes Ripley Fraise, MD  cholecalciferol (VITAMIN D) 1000 UNITS tablet Take 1,000 Units by mouth daily.   Yes Historical Provider, MD  donepezil (ARICEPT) 5 MG tablet Take 2.5 mg by mouth at bedtime.   Yes Historical Provider, MD  metoprolol succinate (TOPROL-XL) 25 MG 24 hr tablet Take 0.5 tablets (12.5 mg total) by mouth daily. HOLD DOSE TONIGHT Patient taking differently: Take 12.5 mg by mouth daily.  09/07/14  Yes Delfina Redwood, MD  Multiple Minerals (CALCIUM-MAGNESIUM-ZINC) TABS Take 1 tablet by mouth  daily.   Yes Historical Provider, MD  Multiple Vitamins-Minerals (MULTIVITAMIN PO) Take 1 tablet by mouth daily.   Yes Historical Provider, MD  sodium chloride 1 G tablet Take 1 g by mouth 2 (two) times daily with a meal.   Yes Historical Provider, MD  metoprolol tartrate (LOPRESSOR) 25 MG tablet Take 12.5-25 mg by mouth daily as needed (Take 1 tablet if HR 120 or greater. Take 1/2 tablet if HR 110-120).     Historical Provider, MD   BP 174/61 mmHg  Pulse 45  Temp(Src) 96 F (35.6 C) (Rectal)  Resp 15  Ht 5\' 2"  (1.575 m)  Wt 117 lb (53.071 kg)  BMI 21.39 kg/m2  SpO2 100% Physical Exam  Constitutional: She appears well-developed and well-nourished.  HENT:  Head: Normocephalic and atraumatic.  Right Ear: External ear normal.  Left Ear: External ear normal.  Nose: Nose normal.  Mouth/Throat: Oropharynx is clear and moist.  Eyes: Conjunctivae and EOM are normal. Pupils are equal, round, and reactive to light.  Neck: Normal range of motion. Neck supple. No JVD present. No tracheal deviation present. No thyromegaly present.  Cardiovascular: Normal rate, regular rhythm, normal heart sounds and intact distal pulses.   Pulmonary/Chest: Effort normal and breath sounds normal. She has no wheezes.  Abdominal: Soft. Bowel sounds are normal. She exhibits no mass. There is no tenderness. There is no guarding.  Musculoskeletal: Normal range of motion.       Feet:  Lymphadenopathy:    She has no cervical adenopathy.  Neurological: She is alert. She has normal reflexes. No cranial nerve deficit or sensory deficit. Gait normal. GCS eye subscore is 4. GCS verbal subscore is 5. GCS motor subscore is 6.  Reflex Scores:      Bicep reflexes are 2+ on the right side and 2+ on the left side.      Patellar reflexes are 2+ on the right side and 2+ on the left side. Strength is normal and equal throughout. Cranial nerves grossly intact. Patient fluent. No gross ataxia and patient able to ambulate without  difficulty.  Skin: Skin is warm and dry.  Psychiatric: She has a normal mood and affect. Her behavior is normal. Judgment and thought content normal.  Nursing note and vitals reviewed.   ED Course  Procedures (including critical care time) Labs Review Labs Reviewed  COMPREHENSIVE METABOLIC PANEL - Abnormal; Notable for the following:    Sodium 131 (*)    Chloride 97 (*)    GFR calc non Af Amer 55 (*)    All other components within normal limits  URINALYSIS, ROUTINE W REFLEX MICROSCOPIC (NOT AT [facility]) - Abnormal; Notable for  the following:    Ketones, ur 15 (*)    Leukocytes, UA MODERATE (*)    All other components within normal limits  URINE MICROSCOPIC-ADD ON - Abnormal; Notable for the following:    Squamous Epithelial / LPF MANY (*)    All other components within normal limits  CBC WITH DIFFERENTIAL/PLATELET  PROTIME-INR  I-STAT TROPOININ, ED  I-STAT CG4 LACTIC ACID, ED  Randolm Idol, ED    Imaging Review Dg Chest 2 View  05/25/2015   CLINICAL DATA:  Hypotension.  EXAM: CHEST  2 VIEW  COMPARISON:  September 06, 2014  FINDINGS: The heart size and mediastinal contours are within normal limits. There are calcified granulomas within the right upper lobe unchanged. There is no focal infiltrate, pulmonary edema, or pleural effusion. The lungs are hyperinflated. The visualized skeletal structures are stable.  IMPRESSION: No active cardiopulmonary disease. Emphysema. Stable calcified granulomas in the right upper lobe.   Electronically Signed   By: Abelardo Diesel M.D.   On: 05/25/2015 21:35   Dg Ankle Complete Left  05/25/2015   CLINICAL DATA:  The patient was sent from [facility] urgent Center for evaluation. Hypotension.  EXAM: LEFT ANKLE COMPLETE - 3+ VIEW  COMPARISON:  None.  FINDINGS: There is no evidence of fracture, dislocation, or joint effusion. Soft tissues are unremarkable.  IMPRESSION: No acute fracture or dislocation.   Electronically Signed   By: Abelardo Diesel M.D.   On:  05/25/2015 21:37     EKG Interpretation   Date/Time:  Monday May 25 2015 18:52:00 EDT Ventricular Rate:  46 PR Interval:  192 QRS Duration: 82 QT Interval:  478 QTC Calculation: 418 R Axis:   77 Text Interpretation:  Sinus bradycardia Possible Anterior infarct , age  undetermined Abnormal ECG Confirmed by Mikalyn Hermida MD, Andee Poles [phone]) on  05/25/2015 8:06:29 PM      MDM   Final diagnoses:  Weakness    81 year old female who presents today with complaints of palpitations last night after missing a dose of her Lopressor. At her assisted living care facility her husband recorded some episodes of low blood pressure. Blood pressures have been normal to high at the urgent care and here. Labs obtained are normal. First troponin was 0.08 and a repeat is.07 She had some tenderness of her left ankle and x-rays obtained shows no evidence of fracture. Patient has been observed here in the emergency departments for several hours and has remained hemodynamically stable. If the repeat troponin is negative, she'll be discharged home. Repeat troponin is normal. I had a long discussion with both the patient and her husband. She is to be seen by her doctor for recheck tomorrow. He is instructed that if she is worse at any time tonight he is to call ambulance and have her return for reevaluation.  Pattricia Boss, MD 05/25/15 2213

## 2015-05-25 NOTE — ED Notes (Signed)
Pt given [place] sandwich and hot chocolate

## 2015-05-25 NOTE — ED Notes (Addendum)
Patient forgot to take metoprolol at bedtime.  During the night woke her spouse with chest pounding and legs/feet hurting.  Continued during the night and today.  Spouse has been checking blood pressure frequently.  This afternoon noticed bp of systolic reading in 61'H per patient's husband.  Slight bilateral ankle edema, no sob, no chest pain.  Spouse reports [facility] facility nurse instructed patient/spouse to go to urgent care.

## 2015-05-26 DIAGNOSIS — D225 Melanocytic nevi of trunk: Secondary | ICD-10-CM | POA: Diagnosis not present

## 2015-05-26 DIAGNOSIS — I10 Essential (primary) hypertension: Secondary | ICD-10-CM | POA: Diagnosis not present

## 2015-05-26 DIAGNOSIS — R1031 Right lower quadrant pain: Secondary | ICD-10-CM | POA: Diagnosis not present

## 2015-05-26 DIAGNOSIS — N390 Urinary tract infection, site not specified: Secondary | ICD-10-CM | POA: Diagnosis not present

## 2015-05-26 DIAGNOSIS — R001 Bradycardia, unspecified: Secondary | ICD-10-CM | POA: Diagnosis not present

## 2015-05-26 DIAGNOSIS — L821 Other seborrheic keratosis: Secondary | ICD-10-CM | POA: Diagnosis not present

## 2015-05-26 DIAGNOSIS — R5383 Other fatigue: Secondary | ICD-10-CM | POA: Diagnosis not present

## 2015-05-26 DIAGNOSIS — I4891 Unspecified atrial fibrillation: Secondary | ICD-10-CM | POA: Diagnosis not present

## 2015-05-26 DIAGNOSIS — R42 Dizziness and giddiness: Secondary | ICD-10-CM | POA: Diagnosis not present

## 2015-05-26 DIAGNOSIS — Z85828 Personal history of other malignant neoplasm of skin: Secondary | ICD-10-CM | POA: Diagnosis not present

## 2015-06-03 ENCOUNTER — Ambulatory Visit: Payer: Medicare Other | Admitting: Gastroenterology

## 2015-06-03 DIAGNOSIS — N952 Postmenopausal atrophic vaginitis: Secondary | ICD-10-CM | POA: Diagnosis not present

## 2015-06-03 DIAGNOSIS — R399 Unspecified symptoms and signs involving the genitourinary system: Secondary | ICD-10-CM | POA: Diagnosis not present

## 2015-06-03 DIAGNOSIS — N390 Urinary tract infection, site not specified: Secondary | ICD-10-CM | POA: Diagnosis not present

## 2015-06-15 DIAGNOSIS — F4323 Adjustment disorder with mixed anxiety and depressed mood: Secondary | ICD-10-CM | POA: Diagnosis not present

## 2015-06-26 ENCOUNTER — Ambulatory Visit (HOSPITAL_COMMUNITY)
Admission: RE | Admit: 2015-06-26 | Discharge: 2015-06-26 | Disposition: A | Discharge status: Home / Self Care | Payer: Medicare Other | Source: Ambulatory Visit | Attending: Family Medicine | Admitting: Family Medicine

## 2015-06-26 DIAGNOSIS — Z1231 Encounter for screening mammogram for malignant neoplasm of breast: Secondary | ICD-10-CM | POA: Insufficient documentation

## 2015-07-30 DIAGNOSIS — F4323 Adjustment disorder with mixed anxiety and depressed mood: Secondary | ICD-10-CM | POA: Diagnosis not present

## 2015-08-03 DIAGNOSIS — Z23 Encounter for immunization: Secondary | ICD-10-CM | POA: Diagnosis not present

## 2015-08-20 DIAGNOSIS — K644 Residual hemorrhoidal skin tags: Secondary | ICD-10-CM | POA: Diagnosis not present

## 2015-08-20 DIAGNOSIS — Z7901 Long term (current) use of anticoagulants: Secondary | ICD-10-CM | POA: Diagnosis not present

## 2015-08-20 DIAGNOSIS — K625 Hemorrhage of anus and rectum: Secondary | ICD-10-CM | POA: Diagnosis not present

## 2015-08-20 LAB — CBC AND DIFFERENTIAL
HEMATOCRIT: 40 % (ref 36–46)
HEMOGLOBIN: 13.5 g/dL (ref 12.0–16.0)
PLATELETS: 182 10*3/uL (ref 150–399)
WBC: 6.3 10^3/mL

## 2015-08-22 ENCOUNTER — Emergency Department (HOSPITAL_COMMUNITY): Payer: Medicare Other

## 2015-08-22 ENCOUNTER — Encounter (HOSPITAL_COMMUNITY): Payer: Self-pay

## 2015-08-22 ENCOUNTER — Emergency Department (HOSPITAL_COMMUNITY)
Admission: EM | Admit: 2015-08-22 | Discharge: 2015-08-22 | Disposition: A | Discharge status: Home / Self Care | Payer: Medicare Other | Attending: Emergency Medicine | Admitting: Emergency Medicine

## 2015-08-22 DIAGNOSIS — Z87828 Personal history of other (healed) physical injury and trauma: Secondary | ICD-10-CM | POA: Diagnosis not present

## 2015-08-22 DIAGNOSIS — Z8669 Personal history of other diseases of the nervous system and sense organs: Secondary | ICD-10-CM | POA: Diagnosis not present

## 2015-08-22 DIAGNOSIS — R531 Weakness: Secondary | ICD-10-CM

## 2015-08-22 DIAGNOSIS — Z8719 Personal history of other diseases of the digestive system: Secondary | ICD-10-CM | POA: Diagnosis not present

## 2015-08-22 DIAGNOSIS — Z8659 Personal history of other mental and behavioral disorders: Secondary | ICD-10-CM | POA: Insufficient documentation

## 2015-08-22 DIAGNOSIS — Z91040 Latex allergy status: Secondary | ICD-10-CM | POA: Diagnosis not present

## 2015-08-22 DIAGNOSIS — Z8639 Personal history of other endocrine, nutritional and metabolic disease: Secondary | ICD-10-CM | POA: Diagnosis not present

## 2015-08-22 DIAGNOSIS — Z79899 Other long term (current) drug therapy: Secondary | ICD-10-CM | POA: Diagnosis not present

## 2015-08-22 DIAGNOSIS — Z9842 Cataract extraction status, left eye: Secondary | ICD-10-CM | POA: Diagnosis not present

## 2015-08-22 DIAGNOSIS — I10 Essential (primary) hypertension: Secondary | ICD-10-CM | POA: Insufficient documentation

## 2015-08-22 DIAGNOSIS — Z87891 Personal history of nicotine dependence: Secondary | ICD-10-CM | POA: Diagnosis not present

## 2015-08-22 DIAGNOSIS — I482 Chronic atrial fibrillation, unspecified: Secondary | ICD-10-CM

## 2015-08-22 DIAGNOSIS — R6889 Other general symptoms and signs: Secondary | ICD-10-CM | POA: Diagnosis not present

## 2015-08-22 DIAGNOSIS — R42 Dizziness and giddiness: Secondary | ICD-10-CM

## 2015-08-22 DIAGNOSIS — Z7902 Long term (current) use of antithrombotics/antiplatelets: Secondary | ICD-10-CM | POA: Diagnosis not present

## 2015-08-22 DIAGNOSIS — Z85828 Personal history of other malignant neoplasm of skin: Secondary | ICD-10-CM | POA: Diagnosis not present

## 2015-08-22 DIAGNOSIS — I4891 Unspecified atrial fibrillation: Secondary | ICD-10-CM | POA: Diagnosis present

## 2015-08-22 DIAGNOSIS — I480 Paroxysmal atrial fibrillation: Secondary | ICD-10-CM | POA: Diagnosis not present

## 2015-08-22 LAB — CBC WITH DIFFERENTIAL/PLATELET
BASOS ABS: 0 10*3/uL (ref 0.0–0.1)
BASOS PCT: 0 %
EOS PCT: 1 %
Eosinophils Absolute: 0.1 10*3/uL (ref 0.0–0.7)
HEMATOCRIT: 42 % (ref 36.0–46.0)
Hemoglobin: 14.1 g/dL (ref 12.0–15.0)
LYMPHS PCT: 35 %
Lymphs Abs: 1.9 10*3/uL (ref 0.7–4.0)
MCH: 31.2 pg (ref 26.0–34.0)
MCHC: 33.6 g/dL (ref 30.0–36.0)
MCV: 92.9 fL (ref 78.0–100.0)
MONO ABS: 0.4 10*3/uL (ref 0.1–1.0)
MONOS PCT: 8 %
Neutro Abs: 3 10*3/uL (ref 1.7–7.7)
Neutrophils Relative %: 56 %
PLATELETS: 187 10*3/uL (ref 150–400)
RBC: 4.52 MIL/uL (ref 3.87–5.11)
RDW: 13.7 % (ref 11.5–15.5)
WBC: 5.4 10*3/uL (ref 4.0–10.5)

## 2015-08-22 LAB — BASIC METABOLIC PANEL
ANION GAP: 5 (ref 5–15)
BUN: 13 mg/dL (ref 6–20)
CALCIUM: 9 mg/dL (ref 8.9–10.3)
CO2: 28 mmol/L (ref 22–32)
CREATININE: 0.69 mg/dL (ref 0.44–1.00)
Chloride: 105 mmol/L (ref 101–111)
GFR calc Af Amer: >60 mL/min (ref >60)
GFR calc non Af Amer: >60 mL/min (ref >60)
GLUCOSE: 123 mg/dL — AB (ref 65–99)
Potassium: 4 mmol/L (ref 3.5–5.1)
Sodium: 138 mmol/L (ref 135–145)

## 2015-08-22 LAB — URINE MICROSCOPIC-ADD ON

## 2015-08-22 LAB — URINALYSIS, ROUTINE W REFLEX MICROSCOPIC
BILIRUBIN URINE: NEGATIVE
GLUCOSE, UA: NEGATIVE mg/dL
HGB URINE DIPSTICK: NEGATIVE
Ketones, ur: NEGATIVE mg/dL
Nitrite: NEGATIVE
PH: 8 (ref 5.0–8.0)
Protein, ur: NEGATIVE mg/dL
SPECIFIC GRAVITY, URINE: 1.007 (ref 1.005–1.030)
UROBILINOGEN UA: 0.2 mg/dL (ref 0.0–1.0)

## 2015-08-22 LAB — I-STAT TROPONIN, ED: TROPONIN I, POC: 0.02 ng/mL (ref 0.00–0.08)

## 2015-08-22 LAB — MAGNESIUM: Magnesium: 2 mg/dL (ref 1.7–2.4)

## 2015-08-22 MED ORDER — SODIUM CHLORIDE 0.9 % IV BOLUS (SEPSIS)
500.0000 mL | Freq: Once | INTRAVENOUS | Status: AC
  Administered 2015-08-22: 500 mL via INTRAVENOUS

## 2015-08-22 MED ORDER — APIXABAN 2.5 MG PO TABS
2.5000 mg | ORAL_TABLET | Freq: Once | ORAL | Status: AC
  Administered 2015-08-22: 2.5 mg via ORAL
  Filled 2015-08-22 (×2): qty 1

## 2015-08-22 NOTE — ED Provider Notes (Signed)
CSN: [id number]     Arrival date & time 08/22/15  1201 History   First MD Initiated Contact with Patient 08/22/15 1203     Chief Complaint  Patient presents with  . Atrial Fibrillation     (Consider location/radiation/quality/duration/timing/severity/associated sxs/prior Treatment) Patient is a 81 y.o. female presenting with near-syncope. The history is provided by the patient.  Near Syncope This is a new problem. The current episode started 6 to 12 hours ago. The problem occurs constantly. The problem has not changed since onset.Pertinent negatives include no chest pain, no abdominal pain, no headaches and no shortness of breath. The symptoms are aggravated by standing. Nothing relieves the symptoms. She has tried nothing for the symptoms. The treatment provided no relief.   81 yo F with a chief complaint of lightheadedness. This is worse upon standing. Patient states that she feels a bit unsteady on her feet. Denies any extremity weakness any loss of coordination. Patient has been urinating more frequently today denies dysuria denies urgency. Patient denies fevers or chills denies back pain. Denies palpitations. Denies chest pain shortness of breath. Patient has a history of atrial fibrillation is on Eliquis. Patient took her metoprolol this morning. When EMS arrived patient would have fluctuating heart rates up into the 150s. Atrial fibrillation. Upon arrival patient continued to have fluctuating heart rates. Clinically orthostatic.   Past Medical History  Diagnosis Date  . Hypertension   . Atrial fibrillation ([place]) 12/2012  . Current use of long term anticoagulation 12/2012    Eliquis  . Mild cognitive impairment   . Cataracts, both eyes 2005, 2013    Cataract extraction/IOL implant  . Diverticulosis 2008  . Hyponatremia     SIADH  . Hyperlipidemia   . Rosacea   . Allergy   . Osteopenia   . Fracture of scaphoid bone of hand   . Arrhythmia     atrial fib  . Squamous cell carcinoma  ([place])   . Actinic keratoses   . Basal cell carcinoma of skin   . Anxiety   . Depression    Past Surgical History  Procedure Laterality Date  . Cesarean section      questionable  . Cataracts Bilateral   . Abdominal hysterectomy     Family History  Problem Relation Age of Onset  . Breast cancer Mother   . Kidney disease Neg Hx   . Liver disease Neg Hx   . Colon cancer Sister   . Colon polyps Sister   . Esophageal cancer Neg Hx   . Pancreatic cancer Neg Hx   . Breast cancer Daughter     x 2   Social History  Substance Use Topics  . Smoking status: Former Research scientist (life sciences)  . Smokeless tobacco: Never Used  . Alcohol Use: 0.0 oz/week    0 Standard drinks or equivalent per week     Comment: 1 drink daily   OB History    No data available     Review of Systems  Constitutional: Negative for fever and chills.  HENT: Negative for congestion and rhinorrhea.   Eyes: Negative for redness and visual disturbance.  Respiratory: Negative for shortness of breath and wheezing.   Cardiovascular: Positive for near-syncope. Negative for chest pain and palpitations.  Gastrointestinal: Negative for nausea, vomiting and abdominal pain.  Genitourinary: Negative for dysuria and urgency.  Musculoskeletal: Negative for myalgias and arthralgias.  Skin: Negative for pallor and wound.  Neurological: Positive for dizziness. Negative for headaches.  Allergies  Solifenacin; Ciprofloxacin; Latex; Macrobid; Macrodantin; Other; Strawberry; and Tramadol  Home Medications   Prior to Admission medications   Medication Sig Start Date End Date Taking? Authorizing Provider  apixaban (ELIQUIS) 2.5 MG TABS tablet Take 1 tablet (2.5 mg total) by mouth 2 (two) times daily. 01/04/13  Yes Ripley Fraise, MD  cholecalciferol (VITAMIN D) 1000 UNITS tablet Take 1,000 Units by mouth daily.   Yes Historical Provider, MD  donepezil (ARICEPT) 5 MG tablet Take 5 mg by mouth at bedtime.    Yes Historical Provider, MD   metoprolol succinate (TOPROL-XL) 25 MG 24 hr tablet Take 0.5 tablets (12.5 mg total) by mouth daily. HOLD DOSE TONIGHT Patient taking differently: Take 12.5 mg by mouth daily.  09/07/14  Yes Delfina Redwood, MD  metoprolol tartrate (LOPRESSOR) 25 MG tablet Take 12.5-25 mg by mouth daily as needed (Take 1 tablet if HR 120 or greater. Take 1/2 tablet if HR 110-120).    Yes Historical Provider, MD  Multiple Minerals (CALCIUM-MAGNESIUM-ZINC) TABS Take 1 tablet by mouth daily.   Yes Historical Provider, MD  Multiple Vitamins-Minerals (MULTIVITAMIN PO) Take 1 tablet by mouth daily.   Yes Historical Provider, MD  Probiotic Product (PROBIOTIC PO) Take 1 capsule by mouth every morning.   Yes Historical Provider, MD  sodium chloride 1 G tablet Take 1 g by mouth 2 (two) times daily with a meal.   Yes Historical Provider, MD   BP 173/84 mmHg  Pulse 48  Temp(Src) 97.6 F (36.4 C) (Oral)  Resp 23  SpO2 100% Physical Exam  Constitutional: She is oriented to person, place, and time. She appears well-developed and well-nourished. No distress.  HENT:  Head: Normocephalic and atraumatic.  Eyes: EOM are normal. Pupils are equal, round, and reactive to light.  Neck: Normal range of motion. Neck supple.  Cardiovascular: An irregular rhythm present. Tachycardia present.  Exam reveals no gallop and no friction rub.   No murmur heard. Pulmonary/Chest: Effort normal. She has no wheezes. She has no rales.  Abdominal: Soft. She exhibits no distension. There is no tenderness. There is no rebound and no guarding.  Musculoskeletal: She exhibits no edema or tenderness.  Neurological: She is alert and oriented to person, place, and time.  Skin: Skin is warm and dry. She is not diaphoretic.  Psychiatric: She has a normal mood and affect. Her behavior is normal.    ED Course  Procedures (including critical care time) Labs Review Labs Reviewed  BASIC METABOLIC PANEL - Abnormal; Notable for the following:     Glucose, Bld 123 (*)    All other components within normal limits  URINALYSIS, ROUTINE W REFLEX MICROSCOPIC (NOT AT [facility]) - Abnormal; Notable for the following:    APPearance CLOUDY (*)    Leukocytes, UA SMALL (*)    All other components within normal limits  CBC WITH DIFFERENTIAL/PLATELET  MAGNESIUM  URINE MICROSCOPIC-ADD ON  Randolm Idol, ED    Imaging Review Dg Chest Port 1 View  08/22/2015   CLINICAL DATA:  Atrial fibrillation, hypertension, anxiety, weakness  EXAM: PORTABLE CHEST 1 VIEW  COMPARISON:  05/25/2015 .  FINDINGS: stable hyperinflation without focal pneumonia, collapse or consolidation. No edema, effusion or pneumothorax. Right midlung calcified granuloma as before. Normal heart size and vascularity. Atherosclerosis noted of the aorta. Bones are osteopenic. No significant interval change. Monitor leads overlie the chest  IMPRESSION: Stable hyperinflation consistent with COPD/ emphysema  No superimposed acute process.   Electronically Signed   By: Jerilynn Mages.  Shick M.D.   On: 08/22/2015 13:58   I have personally reviewed and evaluated these images and lab results as part of my medical decision-making.   EKG Interpretation   Date/Time:  Saturday August 22 2015 12:09:52 EDT Ventricular Rate:  102 PR Interval:    QRS Duration: 63 QT Interval:  364 QTC Calculation: 474 R Axis:   72 Text Interpretation:  Atrial fibrillation Anteroseptal infarct, age  indeterminate when compared to prior afib replaced sinus brady Confirmed  by Tenessa Marsee MD, DANIEL [phone]) on 08/22/2015 12:34:50 PM      EKG Interpretation  Date/Time:  Saturday August 22 2015 12:24:34 EDT Ventricular Rate:  44 PR Interval:  189 QRS Duration: 63 QT Interval:  443 QTC Calculation: 379 R Axis:   67 Text Interpretation:  Sinus bradycardia Probable left atrial enlargement When compared to prior sinus brady replaced a fib Confirmed by Stacia Feazell MD, Quillian Quince ([id number]) on 08/22/2015 12:37:01 PM        MDM   Final  diagnoses:  Generalized weakness  Chronic atrial fibrillation (HCC)  Orthostatic dizziness    81 yo F with a chief complaint of dizziness worse upon standing. Likely orthostatic hypotension. Patient with A. fib with RVR on arrival. Spontaneous resolved and then had a significant sinus bradycardia. Blood pressure remains stable. Will give the patient a bolus of fluids check electrolytes reassess. She presented with similar complaints back in November was found to have a urinary tract infection.  Patient with noted UTI on UA. Laboratory evaluation unremarkable. Doing better after IV fluid bolus. Able to ambulate still feels mildly lightheaded upon standing. Continues to be in sinus bradycardia. Offered admission with persistent orthostasis symptoms. Patient electing discharge home. PCP follow-up.  3:14 PM:  I have discussed the diagnosis/risks/treatment options with the patient and family and believe the pt to be eligible for discharge home to follow-up with PCP. We also discussed returning to the ED immediately if new or worsening sx occur. We discussed the sx which are most concerning (e.g., sudden worsening symptoms, syncope) that necessitate immediate return. Medications administered to the patient during their visit and any new prescriptions provided to the patient are listed below.  Medications given during this visit Medications  sodium chloride 0.9 % bolus 500 mL (0 mLs Intravenous Stopped 08/22/15 1331)  apixaban (ELIQUIS) tablet 2.5 mg (2.5 mg Oral Given 08/22/15 1330)  sodium chloride 0.9 % bolus 500 mL (0 mLs Intravenous Stopped 08/22/15 1440)    Discharge Medication List as of 08/22/2015  2:30 PM       The patient appears reasonably screen and/or stabilized for discharge and I doubt any other medical condition or other [facility] requiring further screening, evaluation, or treatment in the ED at this time prior to discharge.      Deno Etienne, DO 08/22/15 1514

## 2015-08-22 NOTE — ED Notes (Signed)
Pt attempted to ambulate at the bedside. Pt reports she still feels lightheaded upon standing. MD aware.

## 2015-08-22 NOTE — Discharge Instructions (Signed)

## 2015-08-22 NOTE — ED Notes (Signed)
MD at bedside. 

## 2015-08-22 NOTE — ED Notes (Signed)
Pt with sudden HR change down to low 40's. MD at bedside and is aware. Pt husband reports this is normal after patient takes her beta blocker.

## 2015-08-22 NOTE — ED Notes (Addendum)
[facility]- pt coming from [facility] with chest discomfort. Pt found to be uncontrolled a-fib on the monitor with HR between 97-160bpm. Pt has hx of a-fib and takes Metoprolol at home. Pt denies chest discomfort on arrival. No ASA given en route but pt did receive a dose of Metoprolol prior to leaving facility from staff.

## 2015-08-28 DIAGNOSIS — F4323 Adjustment disorder with mixed anxiety and depressed mood: Secondary | ICD-10-CM | POA: Diagnosis not present

## 2015-09-28 DIAGNOSIS — Z85828 Personal history of other malignant neoplasm of skin: Secondary | ICD-10-CM | POA: Diagnosis not present

## 2015-09-28 DIAGNOSIS — L820 Inflamed seborrheic keratosis: Secondary | ICD-10-CM | POA: Diagnosis not present

## 2015-09-28 DIAGNOSIS — C44319 Basal cell carcinoma of skin of other parts of face: Secondary | ICD-10-CM | POA: Diagnosis not present

## 2015-09-28 DIAGNOSIS — L821 Other seborrheic keratosis: Secondary | ICD-10-CM | POA: Diagnosis not present

## 2015-09-28 DIAGNOSIS — C44622 Squamous cell carcinoma of skin of right upper limb, including shoulder: Secondary | ICD-10-CM | POA: Diagnosis not present

## 2015-09-28 DIAGNOSIS — D225 Melanocytic nevi of trunk: Secondary | ICD-10-CM | POA: Diagnosis not present

## 2015-09-28 DIAGNOSIS — L570 Actinic keratosis: Secondary | ICD-10-CM | POA: Diagnosis not present

## 2015-11-05 DIAGNOSIS — F4323 Adjustment disorder with mixed anxiety and depressed mood: Secondary | ICD-10-CM | POA: Diagnosis not present

## 2015-12-02 DIAGNOSIS — H52203 Unspecified astigmatism, bilateral: Secondary | ICD-10-CM | POA: Diagnosis not present

## 2015-12-02 DIAGNOSIS — Z961 Presence of intraocular lens: Secondary | ICD-10-CM | POA: Diagnosis not present

## 2015-12-03 DIAGNOSIS — F4323 Adjustment disorder with mixed anxiety and depressed mood: Secondary | ICD-10-CM | POA: Diagnosis not present

## 2015-12-13 ENCOUNTER — Emergency Department (HOSPITAL_COMMUNITY)
Admission: EM | Admit: 2015-12-13 | Discharge: 2015-12-13 | Disposition: A | Discharge status: Home / Self Care | Payer: Medicare Other | Attending: Emergency Medicine | Admitting: Emergency Medicine

## 2015-12-13 ENCOUNTER — Encounter (HOSPITAL_COMMUNITY): Payer: Self-pay | Admitting: *Deleted

## 2015-12-13 ENCOUNTER — Emergency Department (HOSPITAL_COMMUNITY): Payer: Medicare Other

## 2015-12-13 DIAGNOSIS — Z7902 Long term (current) use of antithrombotics/antiplatelets: Secondary | ICD-10-CM | POA: Diagnosis not present

## 2015-12-13 DIAGNOSIS — I10 Essential (primary) hypertension: Secondary | ICD-10-CM | POA: Diagnosis not present

## 2015-12-13 DIAGNOSIS — R350 Frequency of micturition: Secondary | ICD-10-CM | POA: Diagnosis not present

## 2015-12-13 DIAGNOSIS — Z87891 Personal history of nicotine dependence: Secondary | ICD-10-CM | POA: Diagnosis not present

## 2015-12-13 DIAGNOSIS — Z79899 Other long term (current) drug therapy: Secondary | ICD-10-CM | POA: Diagnosis not present

## 2015-12-13 DIAGNOSIS — Z8639 Personal history of other endocrine, nutritional and metabolic disease: Secondary | ICD-10-CM | POA: Diagnosis not present

## 2015-12-13 DIAGNOSIS — Z85828 Personal history of other malignant neoplasm of skin: Secondary | ICD-10-CM | POA: Diagnosis not present

## 2015-12-13 DIAGNOSIS — Z8739 Personal history of other diseases of the musculoskeletal system and connective tissue: Secondary | ICD-10-CM | POA: Diagnosis not present

## 2015-12-13 DIAGNOSIS — Z8781 Personal history of (healed) traumatic fracture: Secondary | ICD-10-CM | POA: Diagnosis not present

## 2015-12-13 DIAGNOSIS — Z8669 Personal history of other diseases of the nervous system and sense organs: Secondary | ICD-10-CM | POA: Diagnosis not present

## 2015-12-13 DIAGNOSIS — Z7901 Long term (current) use of anticoagulants: Secondary | ICD-10-CM | POA: Diagnosis not present

## 2015-12-13 DIAGNOSIS — F0390 Unspecified dementia without behavioral disturbance: Secondary | ICD-10-CM | POA: Insufficient documentation

## 2015-12-13 DIAGNOSIS — F419 Anxiety disorder, unspecified: Secondary | ICD-10-CM | POA: Insufficient documentation

## 2015-12-13 DIAGNOSIS — Z91040 Latex allergy status: Secondary | ICD-10-CM | POA: Diagnosis not present

## 2015-12-13 DIAGNOSIS — Z9842 Cataract extraction status, left eye: Secondary | ICD-10-CM | POA: Diagnosis not present

## 2015-12-13 DIAGNOSIS — R0602 Shortness of breath: Secondary | ICD-10-CM | POA: Diagnosis not present

## 2015-12-13 DIAGNOSIS — Z8719 Personal history of other diseases of the digestive system: Secondary | ICD-10-CM | POA: Diagnosis not present

## 2015-12-13 DIAGNOSIS — Z9841 Cataract extraction status, right eye: Secondary | ICD-10-CM | POA: Insufficient documentation

## 2015-12-13 DIAGNOSIS — Z872 Personal history of diseases of the skin and subcutaneous tissue: Secondary | ICD-10-CM | POA: Diagnosis not present

## 2015-12-13 DIAGNOSIS — R000 Tachycardia, unspecified: Secondary | ICD-10-CM | POA: Diagnosis not present

## 2015-12-13 DIAGNOSIS — I4891 Unspecified atrial fibrillation: Secondary | ICD-10-CM | POA: Insufficient documentation

## 2015-12-13 LAB — URINALYSIS, ROUTINE W REFLEX MICROSCOPIC
BILIRUBIN URINE: NEGATIVE
GLUCOSE, UA: NEGATIVE mg/dL
Hgb urine dipstick: NEGATIVE
KETONES UR: NEGATIVE mg/dL
Leukocytes, UA: NEGATIVE
NITRITE: NEGATIVE
PH: 7.5 (ref 5.0–8.0)
PROTEIN: NEGATIVE mg/dL
Specific Gravity, Urine: 1.006 (ref 1.005–1.030)

## 2015-12-13 LAB — CBC
HCT: 41.4 % (ref 36.0–46.0)
HEMOGLOBIN: 14 g/dL (ref 12.0–15.0)
MCH: 31.3 pg (ref 26.0–34.0)
MCHC: 33.8 g/dL (ref 30.0–36.0)
MCV: 92.6 fL (ref 78.0–100.0)
Platelets: 156 10*3/uL (ref 150–400)
RBC: 4.47 MIL/uL (ref 3.87–5.11)
RDW: 13.4 % (ref 11.5–15.5)
WBC: 4.5 10*3/uL (ref 4.0–10.5)

## 2015-12-13 LAB — I-STAT CHEM 8, ED
BUN: 17 mg/dL (ref 6–20)
CREATININE: 0.8 mg/dL (ref 0.44–1.00)
Calcium, Ion: 1.16 mmol/L (ref 1.13–1.30)
Chloride: 99 mmol/L — ABNORMAL LOW (ref 101–111)
Glucose, Bld: 97 mg/dL (ref 65–99)
HEMATOCRIT: 46 % (ref 36.0–46.0)
HEMOGLOBIN: 15.6 g/dL — AB (ref 12.0–15.0)
Potassium: 4.3 mmol/L (ref 3.5–5.1)
SODIUM: 137 mmol/L (ref 135–145)
TCO2: 25 mmol/L (ref 0–100)

## 2015-12-13 MED ORDER — APIXABAN 2.5 MG PO TABS
2.5000 mg | ORAL_TABLET | Freq: Once | ORAL | Status: AC
  Administered 2015-12-13: 2.5 mg via ORAL
  Filled 2015-12-13: qty 1

## 2015-12-13 MED ORDER — METOPROLOL TARTRATE 25 MG PO TABS
12.5000 mg | ORAL_TABLET | Freq: Once | ORAL | Status: AC
  Administered 2015-12-13: 12.5 mg via ORAL
  Filled 2015-12-13: qty 1

## 2015-12-13 NOTE — ED Notes (Signed)
Pt presents via [facility] from [employer], pt woke up this AM c/o dizziness, chest heaviness, SOB, and weakness.  EMS found pt to be in Afib RVR 110-150, up to 170 standing. Hx: Afib.  EMS administered 10mg  Cardizem, pt rate decreased to 60-100, pt now denies complaints.  Pt a x 4, NAD.  Husband at bedside.  20g L W

## 2015-12-13 NOTE — ED Provider Notes (Signed)
CSN: [id number]     Arrival date & time 12/13/15  [id number] History   First MD Initiated Contact with Patient 12/13/15 [phone]     Chief Complaint  Patient presents with  . Atrial Fibrillation     (Consider location/radiation/quality/duration/timing/severity/associated sxs/prior Treatment) HPI Level V caveat dementia. History is obtained from patient and from patient's husband and from nurses notes. Patient awakened 7 AM today complaining of anterior chest heaviness, nonradiating. Her husband took her blood pressure and pulse noted to be 122/84 and 54 respectively. Husband is that chest discomfort lasted approximately 1.5 hours .Subsequently he called the nurse at [facility]. Patient was noted to be tachycardic. EMS noted to patient to be in rapid rhythm, atrial fibrillation at approximately 1 70 bpm. Treated patient with Cardizem 10 mg IV and patient subsequently became asymptomatic. She remains asymptomatic at present. No other associated symptoms Past Medical History  Diagnosis Date  . Hypertension   . Atrial fibrillation ([place]) 12/2012  . Current use of long term anticoagulation 12/2012    Eliquis  . Mild cognitive impairment   . Cataracts, both eyes 2005, 2013    Cataract extraction/IOL implant  . Diverticulosis 2008  . Hyponatremia     SIADH  . Hyperlipidemia   . Rosacea   . Allergy   . Osteopenia   . Fracture of scaphoid bone of hand   . Arrhythmia     atrial fib  . Squamous cell carcinoma ([place])   . Actinic keratoses   . Basal cell carcinoma of skin   . Anxiety   . Depression    Past Surgical History  Procedure Laterality Date  . Cesarean section      questionable  . Cataracts Bilateral   . Abdominal hysterectomy     Family History  Problem Relation Age of Onset  . Breast cancer Mother   . Kidney disease Neg Hx   . Liver disease Neg Hx   . Colon cancer Sister   . Colon polyps Sister   . Esophageal cancer Neg Hx   . Pancreatic cancer Neg Hx   . Breast cancer Daughter      x 2   Social History  Substance Use Topics  . Smoking status: Former Research scientist (life sciences)  . Smokeless tobacco: Never Used  . Alcohol Use: 0.0 oz/week    0 Standard drinks or equivalent per week     Comment: 1 drink daily   OB History    No data available     Review of Systems  Unable to perform ROS: Dementia  Cardiovascular: Positive for chest pain.  Genitourinary: Positive for frequency.      Allergies  Solifenacin; Ciprofloxacin; Latex; Macrobid; Macrodantin; Other; Strawberry extract; and Tramadol  Home Medications   Prior to Admission medications   Medication Sig Start Date End Date Taking? Authorizing Provider  apixaban (ELIQUIS) 2.5 MG TABS tablet Take 1 tablet (2.5 mg total) by mouth 2 (two) times daily. 01/04/13   Ripley Fraise, MD  cholecalciferol (VITAMIN D) 1000 UNITS tablet Take 1,000 Units by mouth daily.    Historical Provider, MD  donepezil (ARICEPT) 5 MG tablet Take 5 mg by mouth at bedtime.     Historical Provider, MD  metoprolol succinate (TOPROL-XL) 25 MG 24 hr tablet Take 0.5 tablets (12.5 mg total) by mouth daily. HOLD DOSE TONIGHT Patient taking differently: Take 12.5 mg by mouth daily.  09/07/14   Delfina Redwood, MD  metoprolol tartrate (LOPRESSOR) 25 MG tablet Take 12.5-25 mg by mouth daily as  needed (Take 1 tablet if HR 120 or greater. Take 1/2 tablet if HR 110-120).     Historical Provider, MD  Multiple Minerals (CALCIUM-MAGNESIUM-ZINC) TABS Take 1 tablet by mouth daily.    Historical Provider, MD  Multiple Vitamins-Minerals (MULTIVITAMIN PO) Take 1 tablet by mouth daily.    Historical Provider, MD  Probiotic Product (PROBIOTIC PO) Take 1 capsule by mouth every morning.    Historical Provider, MD  sodium chloride 1 G tablet Take 1 g by mouth 2 (two) times daily with a meal.    Historical Provider, MD   BP 123/76 mmHg  Pulse 80  Temp(Src) 97.9 F (36.6 C) (Oral)  Resp 17  SpO2 99% Physical Exam  Constitutional: She appears well-developed and  well-nourished.  HENT:  Head: Normocephalic and atraumatic.  Eyes: Conjunctivae are normal. Pupils are equal, round, and reactive to light.  Neck: Neck supple. No tracheal deviation present. No thyromegaly present.  Cardiovascular: Normal rate.   No murmur heard. Irregularly irregular  Pulmonary/Chest: Effort normal and breath sounds normal.  Abdominal: Soft. Bowel sounds are normal. She exhibits no distension. There is no tenderness.  Musculoskeletal: Normal range of motion. She exhibits no edema or tenderness.  Neurological: She is alert. Coordination normal.  Skin: Skin is warm and dry. No rash noted.  Psychiatric: She has a normal mood and affect.  Nursing note and vitals reviewed.   ED Course  Procedures (including critical care time) Labs Review Labs Reviewed  BASIC METABOLIC PANEL  CBC    Imaging Review No results found. I have personally reviewed and evaluated these images and lab results as part of my medical decision-making.   EKG Interpretation   Date/Time:  Sunday December 13 2015 09:40:34 EST Ventricular Rate:  79 PR Interval:    QRS Duration: 86 QT Interval:  384 QTC Calculation: 440 R Axis:   74 Text Interpretation:  Atrial fibrillation Consider left ventricular  hypertrophy Baseline wander in lead(s) V3 New since previous tracing  Confirmed by Jazzman Loughmiller  MD, Zuly Belkin [phone]) on 12/13/2015 9:47:32 AM     Chest x-ray viewed by me. Results for orders placed or performed during the hospital encounter of 12/13/15  CBC  Result Value Ref Range   WBC 4.5 4.0 - 10.5 K/uL   RBC 4.47 3.87 - 5.11 MIL/uL   Hemoglobin 14.0 12.0 - 15.0 g/dL   HCT 41.4 36.0 - 46.0 %   MCV 92.6 78.0 - 100.0 fL   MCH 31.3 26.0 - 34.0 pg   MCHC 33.8 30.0 - 36.0 g/dL   RDW 13.4 11.5 - 15.5 %   Platelets 156 150 - 400 K/uL  I-stat chem 8, ed  Result Value Ref Range   Sodium 137 135 - 145 mmol/L   Potassium 4.3 3.5 - 5.1 mmol/L   Chloride 99 (L) 101 - 111 mmol/L   BUN 17 6 - 20 mg/dL    Creatinine, Ser 0.80 0.44 - 1.00 mg/dL   Glucose, Bld 97 65 - 99 mg/dL   Calcium, Ion 1.16 1.13 - 1.30 mmol/L   TCO2 25 0 - 100 mmol/L   Hemoglobin 15.6 (H) 12.0 - 15.0 g/dL   HCT 46.0 36.0 - 46.0 %   Dg Chest 2 View  12/13/2015  CLINICAL DATA:  Dizziness and shortness of Breath EXAM: CHEST - 2 VIEW COMPARISON:  08/22/2015 FINDINGS: Cardiac shadow is stable. The lungs are mildly hyperinflated. Calcified granulomas are again noted on the right. The bony structures are within normal limits. IMPRESSION: No  acute abnormality noted. Electronically Signed   By: Inez Catalina M.D.   On: 12/13/2015 10:59   Results for orders placed or performed during the hospital encounter of 12/13/15  CBC  Result Value Ref Range   WBC 4.5 4.0 - 10.5 K/uL   RBC 4.47 3.87 - 5.11 MIL/uL   Hemoglobin 14.0 12.0 - 15.0 g/dL   HCT 41.4 36.0 - 46.0 %   MCV 92.6 78.0 - 100.0 fL   MCH 31.3 26.0 - 34.0 pg   MCHC 33.8 30.0 - 36.0 g/dL   RDW 13.4 11.5 - 15.5 %   Platelets 156 150 - 400 K/uL  Urinalysis, Routine w reflex microscopic (not at [facility])  Result Value Ref Range   Color, Urine YELLOW YELLOW   APPearance CLEAR CLEAR   Specific Gravity, Urine 1.006 1.005 - 1.030   pH 7.5 5.0 - 8.0   Glucose, UA NEGATIVE NEGATIVE mg/dL   Hgb urine dipstick NEGATIVE NEGATIVE   Bilirubin Urine NEGATIVE NEGATIVE   Ketones, ur NEGATIVE NEGATIVE mg/dL   Protein, ur NEGATIVE NEGATIVE mg/dL   Nitrite NEGATIVE NEGATIVE   Leukocytes, UA NEGATIVE NEGATIVE  I-stat chem 8, ed  Result Value Ref Range   Sodium 137 135 - 145 mmol/L   Potassium 4.3 3.5 - 5.1 mmol/L   Chloride 99 (L) 101 - 111 mmol/L   BUN 17 6 - 20 mg/dL   Creatinine, Ser 0.80 0.44 - 1.00 mg/dL   Glucose, Bld 97 65 - 99 mg/dL   Calcium, Ion 1.16 1.13 - 1.30 mmol/L   TCO2 25 0 - 100 mmol/L   Hemoglobin 15.6 (H) 12.0 - 15.0 g/dL   HCT 46.0 36.0 - 46.0 %   Dg Chest 2 View  12/13/2015  CLINICAL DATA:  Dizziness and shortness of Breath EXAM: CHEST - 2 VIEW COMPARISON:   08/22/2015 FINDINGS: Cardiac shadow is stable. The lungs are mildly hyperinflated. Calcified granulomas are again noted on the right. The bony structures are within normal limits. IMPRESSION: No acute abnormality noted. Electronically Signed   By: Inez Catalina M.D.   On: 12/13/2015 10:59    12:40 PM patient resting comfortably. Asymptomatic MDM  Case discussed with Dr.Canmitz, cardiologist on call. Suggested cardioversion here prior to discharge. I discussed with patient and with her husband cardioversion, sedation and risks of and benefits of cardioversion. They decline. Dr.Canmitz was recontacted by telephone. Instead we will increase metoprolol succinate to 12.5 mg twice daily. Patient's husband instructed to call Dr.Tilley's office tomorrow to arrange to be seen this week Final diagnoses:  None   Diagnosis atrial fibrillation with rapid ventricular rate     Orlie Dakin, MD 12/13/15 1247

## 2015-12-13 NOTE — ED Notes (Signed)
Patient transported to X-ray 

## 2015-12-13 NOTE — ED Notes (Signed)
Patient returned from X-ray 

## 2015-12-13 NOTE — Care Management (Signed)
Discussed case with Dr. Wynonia Lawman.  Please have the patient call his office tomorrow for follow up appointment this week.  Merna Baldi MD

## 2015-12-13 NOTE — Discharge Instructions (Signed)
Atrial Fibrillation Increase the dose of metoprolol succinate to 12.5 mg(one half tablet) twice daily. Call Dr. Thurman Coyer office tomorrow to arrange to be seen in his office this week. Return if condition worsens for any reason or if concerned Atrial fibrillation is a type of heartbeat that is irregular or fast (rapid). If you have this condition, your heart keeps quivering in a weird (chaotic) way. This condition can make it so your heart cannot pump blood normally. Having this condition gives a person more risk for stroke, heart failure, and other heart problems. There are different types of atrial fibrillation. Talk with your doctor to learn about the type that you have. HOME CARE  Take over-the-counter and prescription medicines only as told by your doctor.  If your doctor prescribed a blood-thinning medicine, take it exactly as told. Taking too much of it can cause bleeding. If you do not take enough of it, you will not have the protection that you need against stroke and other problems.  Do not use any tobacco products. These include cigarettes, chewing tobacco, and e-cigarettes. If you need help quitting, ask your doctor.  If you have apnea (obstructive sleep apnea), manage it as told by your doctor.  Do not drink alcohol.  Do not drink beverages that have caffeine. These include coffee, soda, and tea.  Maintain a healthy weight. Do not use diet pills unless your doctor says they are safe for you. Diet pills may make heart problems worse.  Follow diet instructions as told by your doctor.  Exercise regularly as told by your doctor.  Keep all follow-up visits as told by your doctor. This is important. GET HELP IF:  You notice a change in the speed, rhythm, or strength of your heartbeat.  You are taking a blood-thinning medicine and you notice more bruising.  You get tired more easily when you move or exercise. GET HELP RIGHT AWAY IF:  You have pain in your chest or your belly  (abdomen).  You have sweating or weakness.  You feel sick to your stomach (nauseous).  You notice blood in your throw up (vomit), poop (stool), or pee (urine).  You are short of breath.  You suddenly have swollen feet and ankles.  You feel dizzy.  Your suddenly get weak or numb in your face, arms, or legs, especially if it happens on one side of your body.  You have trouble talking, trouble understanding, or both.  Your face or your eyelid droops on one side. These symptoms may be an emergency. Do not wait to see if the symptoms will go away. Get medical help right away. Call your local emergency services (911 in the [place] Do not drive yourself to the hospital.   This information is not intended to replace advice given to you by your health care provider. Make sure you discuss any questions you have with your health care provider.   Document Released: 08/16/2008 Document Revised: 07/29/2015 Document Reviewed: 03/04/2015 Elsevier Interactive Patient Education [employer].

## 2015-12-21 DIAGNOSIS — I10 Essential (primary) hypertension: Secondary | ICD-10-CM | POA: Diagnosis not present

## 2015-12-21 DIAGNOSIS — R350 Frequency of micturition: Secondary | ICD-10-CM | POA: Diagnosis not present

## 2015-12-21 DIAGNOSIS — I4891 Unspecified atrial fibrillation: Secondary | ICD-10-CM | POA: Diagnosis not present

## 2015-12-22 DIAGNOSIS — I480 Paroxysmal atrial fibrillation: Secondary | ICD-10-CM | POA: Diagnosis not present

## 2015-12-22 DIAGNOSIS — I10 Essential (primary) hypertension: Secondary | ICD-10-CM | POA: Diagnosis not present

## 2015-12-22 DIAGNOSIS — I872 Venous insufficiency (chronic) (peripheral): Secondary | ICD-10-CM | POA: Diagnosis not present

## 2015-12-22 DIAGNOSIS — Z7901 Long term (current) use of anticoagulants: Secondary | ICD-10-CM | POA: Diagnosis not present

## 2016-01-04 DIAGNOSIS — G301 Alzheimer's disease with late onset: Secondary | ICD-10-CM | POA: Diagnosis not present

## 2016-01-04 DIAGNOSIS — F329 Major depressive disorder, single episode, unspecified: Secondary | ICD-10-CM | POA: Diagnosis not present

## 2016-01-04 DIAGNOSIS — F4323 Adjustment disorder with mixed anxiety and depressed mood: Secondary | ICD-10-CM | POA: Diagnosis not present

## 2016-01-04 DIAGNOSIS — F0280 Dementia in other diseases classified elsewhere without behavioral disturbance: Secondary | ICD-10-CM | POA: Diagnosis not present

## 2016-01-04 DIAGNOSIS — R441 Visual hallucinations: Secondary | ICD-10-CM | POA: Diagnosis not present

## 2016-01-06 DIAGNOSIS — S81002A Unspecified open wound, left knee, initial encounter: Secondary | ICD-10-CM | POA: Diagnosis not present

## 2016-02-16 DIAGNOSIS — G301 Alzheimer's disease with late onset: Secondary | ICD-10-CM | POA: Diagnosis not present

## 2016-02-16 DIAGNOSIS — F0280 Dementia in other diseases classified elsewhere without behavioral disturbance: Secondary | ICD-10-CM | POA: Diagnosis not present

## 2016-02-22 DIAGNOSIS — L821 Other seborrheic keratosis: Secondary | ICD-10-CM | POA: Diagnosis not present

## 2016-02-22 DIAGNOSIS — Z85828 Personal history of other malignant neoplasm of skin: Secondary | ICD-10-CM | POA: Diagnosis not present

## 2016-02-22 DIAGNOSIS — L570 Actinic keratosis: Secondary | ICD-10-CM | POA: Diagnosis not present

## 2016-02-22 DIAGNOSIS — L905 Scar conditions and fibrosis of skin: Secondary | ICD-10-CM | POA: Diagnosis not present

## 2016-02-22 DIAGNOSIS — L820 Inflamed seborrheic keratosis: Secondary | ICD-10-CM | POA: Diagnosis not present

## 2016-03-08 IMAGING — CR DG HIP (WITH OR WITHOUT PELVIS) 2-3V*L*
2 series · 2 of 2 positions shown · non-contrast
Comparison: None.

CLINICAL DATA: 87-year-old female status post fall on 09/14/2014,
initial encounter. Left hip pain.

EXAM:
LEFT HIP - COMPLETE 2+ VIEW

[view not recorded (1 of 2)]
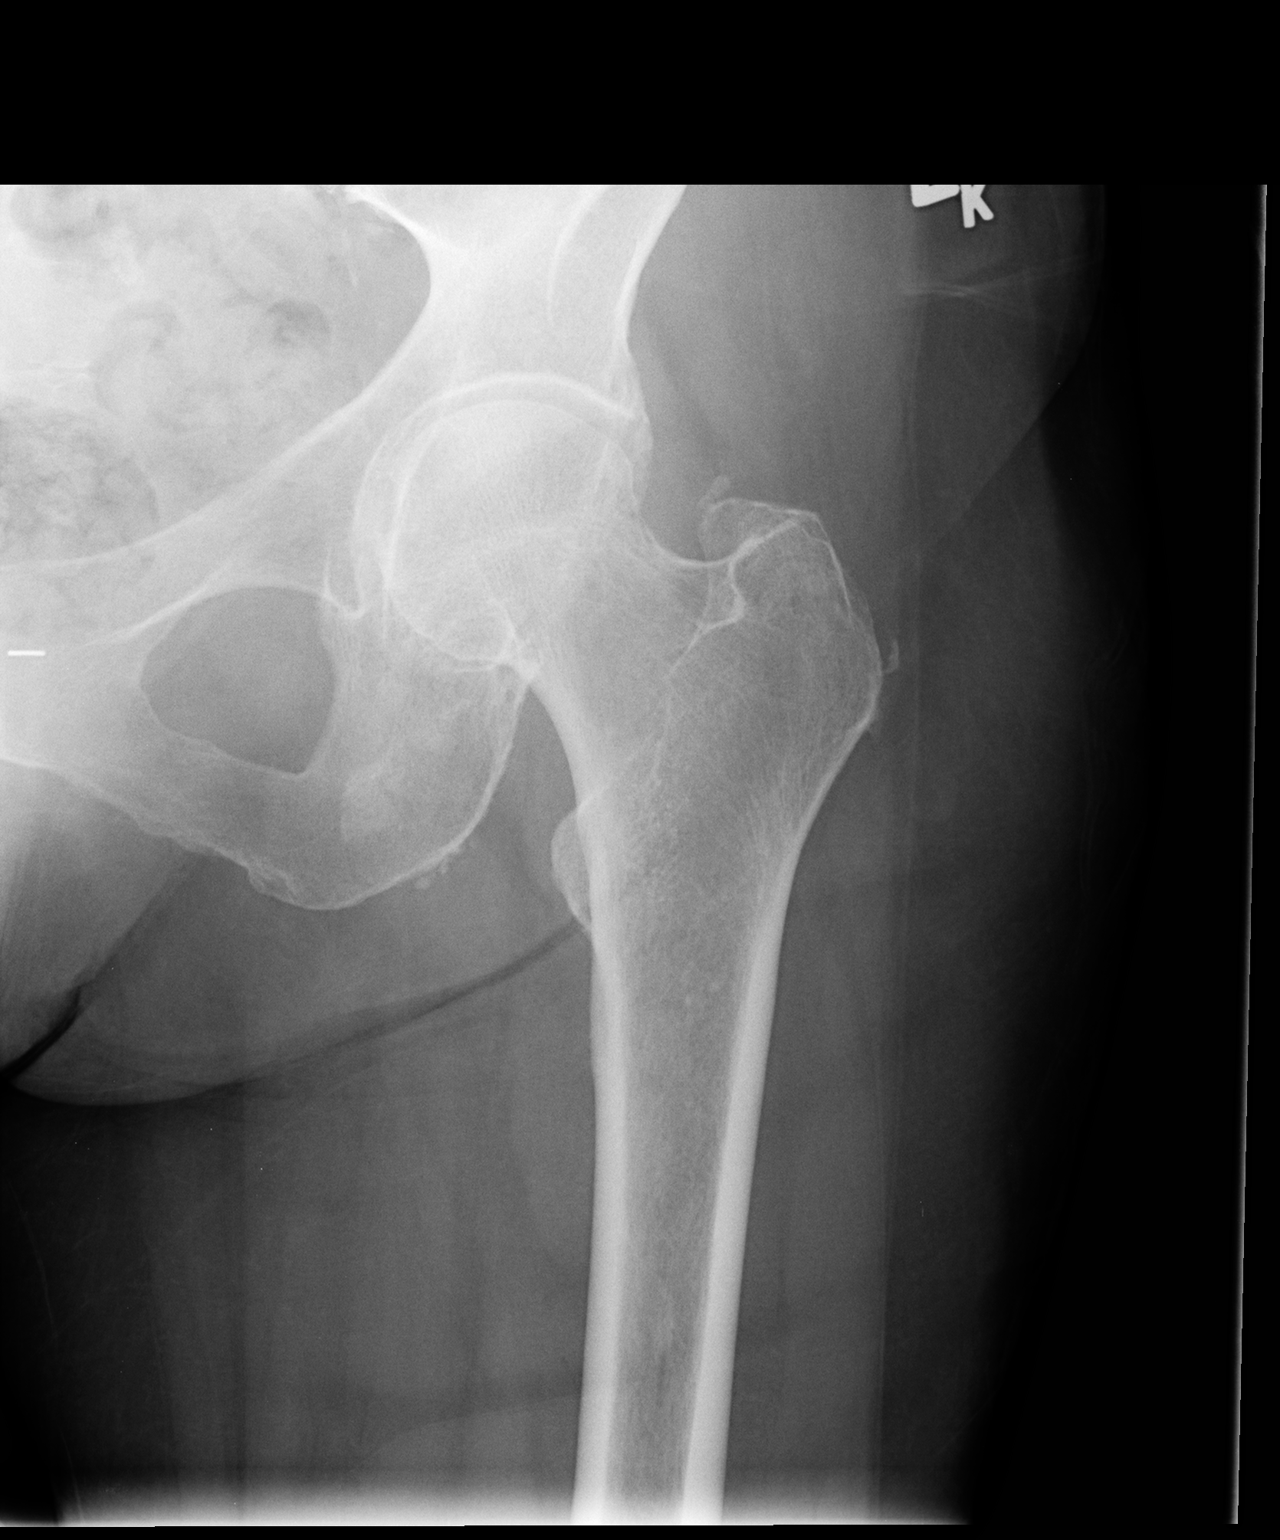

[view not recorded (2 of 2)]
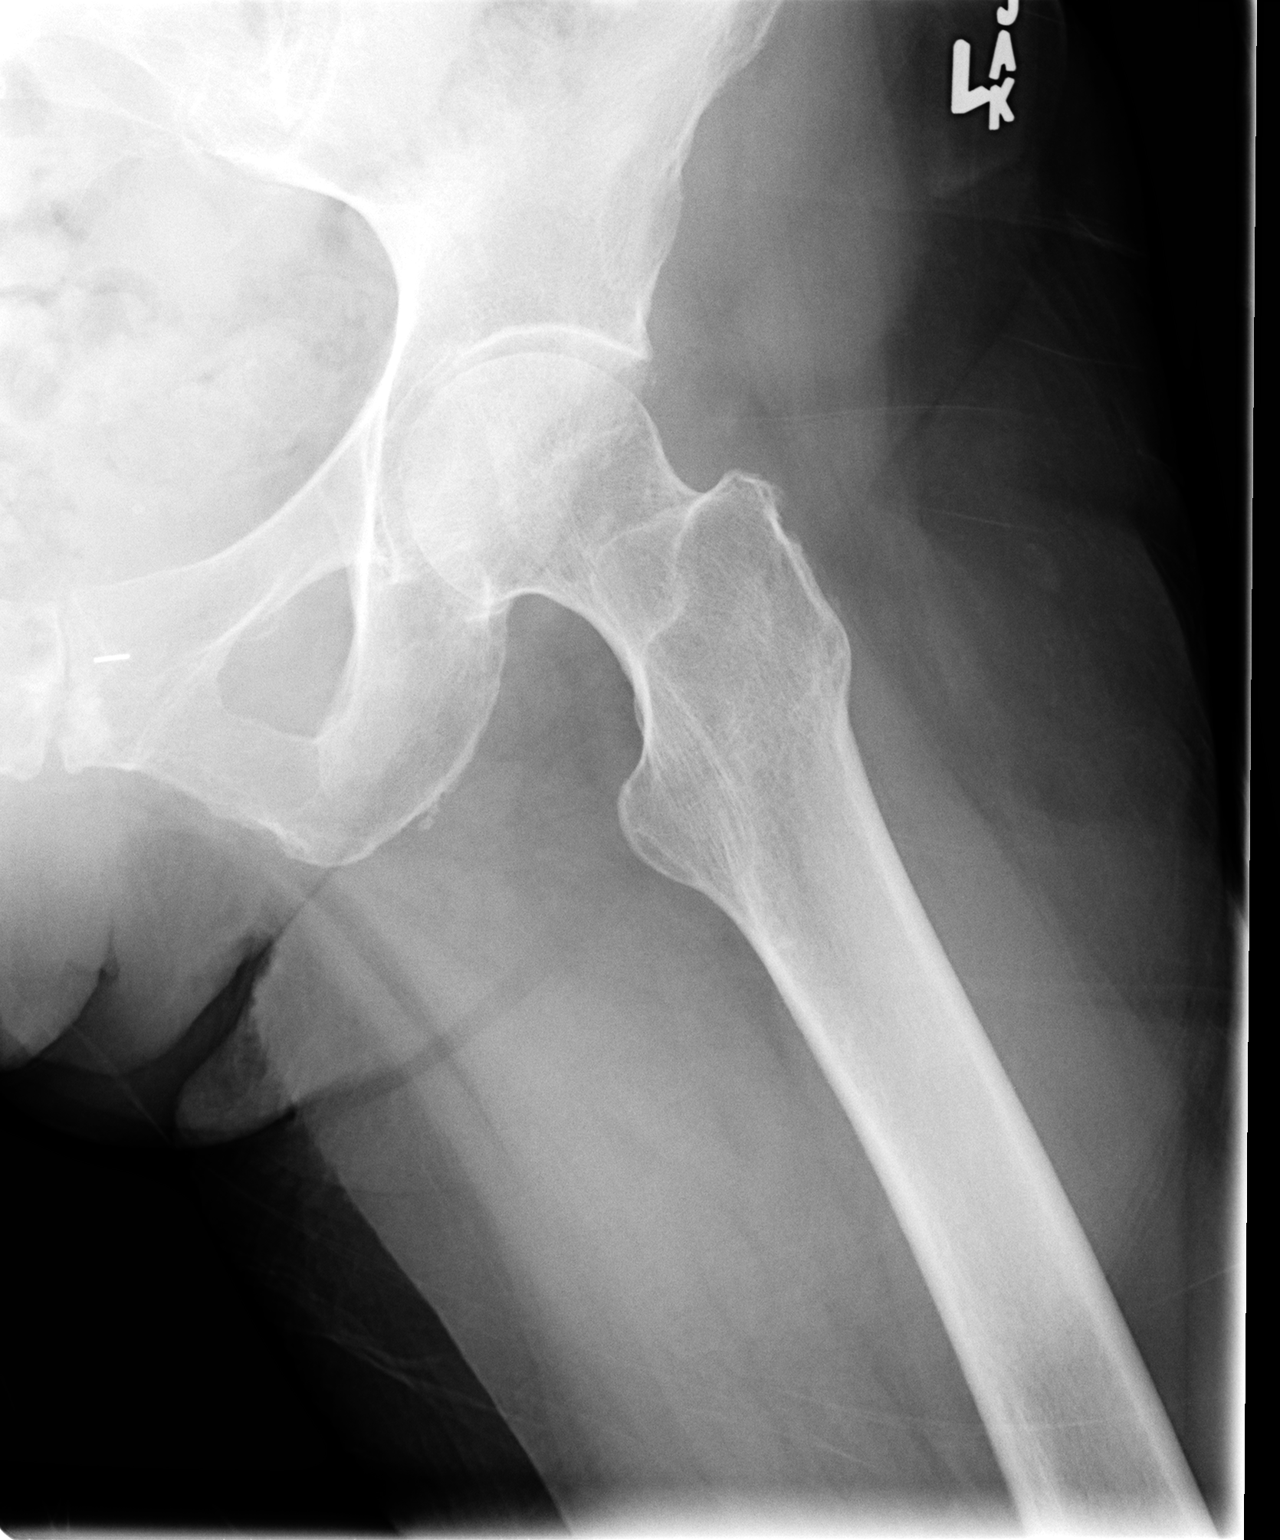

[2 of 2 positions shown; findings below may reference images not displayed]

FINDINGS: Small surgical clip near the pubic symphysis. Left femoral head
normally located. Left hip joint space preserved. Visible left
hemipelvis intact. Proximal left femur intact. Visible left SI joint
within normal limits.
IMPRESSION: No acute fracture or dislocation identified about the left hip.

## 2016-03-15 DIAGNOSIS — I10 Essential (primary) hypertension: Secondary | ICD-10-CM | POA: Diagnosis not present

## 2016-03-15 DIAGNOSIS — Z87891 Personal history of nicotine dependence: Secondary | ICD-10-CM | POA: Diagnosis not present

## 2016-03-15 DIAGNOSIS — F329 Major depressive disorder, single episode, unspecified: Secondary | ICD-10-CM | POA: Diagnosis not present

## 2016-03-15 DIAGNOSIS — G301 Alzheimer's disease with late onset: Secondary | ICD-10-CM | POA: Diagnosis not present

## 2016-03-15 DIAGNOSIS — Z79899 Other long term (current) drug therapy: Secondary | ICD-10-CM | POA: Diagnosis not present

## 2016-03-15 DIAGNOSIS — F0280 Dementia in other diseases classified elsewhere without behavioral disturbance: Secondary | ICD-10-CM | POA: Diagnosis not present

## 2016-03-15 DIAGNOSIS — R441 Visual hallucinations: Secondary | ICD-10-CM | POA: Diagnosis not present

## 2016-03-15 DIAGNOSIS — E871 Hypo-osmolality and hyponatremia: Secondary | ICD-10-CM | POA: Diagnosis not present

## 2016-04-23 DIAGNOSIS — S60552A Superficial foreign body of left hand, initial encounter: Secondary | ICD-10-CM | POA: Diagnosis not present

## 2016-04-28 DIAGNOSIS — R300 Dysuria: Secondary | ICD-10-CM | POA: Diagnosis not present

## 2016-04-28 DIAGNOSIS — J069 Acute upper respiratory infection, unspecified: Secondary | ICD-10-CM | POA: Diagnosis not present

## 2016-04-28 DIAGNOSIS — R8290 Unspecified abnormal findings in urine: Secondary | ICD-10-CM | POA: Diagnosis not present

## 2016-04-28 DIAGNOSIS — R8299 Other abnormal findings in urine: Secondary | ICD-10-CM | POA: Diagnosis not present

## 2016-05-09 DIAGNOSIS — Z79899 Other long term (current) drug therapy: Secondary | ICD-10-CM | POA: Diagnosis not present

## 2016-05-09 DIAGNOSIS — E049 Nontoxic goiter, unspecified: Secondary | ICD-10-CM | POA: Diagnosis not present

## 2016-05-09 DIAGNOSIS — E785 Hyperlipidemia, unspecified: Secondary | ICD-10-CM | POA: Diagnosis not present

## 2016-05-09 DIAGNOSIS — I4891 Unspecified atrial fibrillation: Secondary | ICD-10-CM | POA: Diagnosis not present

## 2016-05-09 DIAGNOSIS — Z7901 Long term (current) use of anticoagulants: Secondary | ICD-10-CM | POA: Diagnosis not present

## 2016-05-09 DIAGNOSIS — I10 Essential (primary) hypertension: Secondary | ICD-10-CM | POA: Diagnosis not present

## 2016-05-09 DIAGNOSIS — E871 Hypo-osmolality and hyponatremia: Secondary | ICD-10-CM | POA: Diagnosis not present

## 2016-05-09 LAB — BASIC METABOLIC PANEL
BUN: 24 mg/dL — AB (ref 4–21)
Creatinine: 0.9 mg/dL (ref 0.5–1.1)
Glucose: 67 mg/dL
POTASSIUM: 5.4 mmol/L — AB (ref 3.4–5.3)
SODIUM: 132 mmol/L — AB (ref 137–147)

## 2016-05-09 LAB — CBC AND DIFFERENTIAL
HCT: 40 % (ref 36–46)
HEMOGLOBIN: 13.4 g/dL (ref 12.0–16.0)
Platelets: 201 10*3/uL (ref 150–399)
WBC: 6 10*3/mL

## 2016-06-03 DIAGNOSIS — R2689 Other abnormalities of gait and mobility: Secondary | ICD-10-CM | POA: Diagnosis not present

## 2016-06-03 DIAGNOSIS — R278 Other lack of coordination: Secondary | ICD-10-CM | POA: Diagnosis not present

## 2016-06-03 DIAGNOSIS — F0391 Unspecified dementia with behavioral disturbance: Secondary | ICD-10-CM | POA: Diagnosis not present

## 2016-06-03 DIAGNOSIS — R2681 Unsteadiness on feet: Secondary | ICD-10-CM | POA: Diagnosis not present

## 2016-06-15 ENCOUNTER — Other Ambulatory Visit: Payer: Self-pay | Admitting: Family Medicine

## 2016-06-15 DIAGNOSIS — Z1231 Encounter for screening mammogram for malignant neoplasm of breast: Secondary | ICD-10-CM

## 2016-06-17 DIAGNOSIS — F0391 Unspecified dementia with behavioral disturbance: Secondary | ICD-10-CM | POA: Diagnosis not present

## 2016-06-17 DIAGNOSIS — R2681 Unsteadiness on feet: Secondary | ICD-10-CM | POA: Diagnosis not present

## 2016-06-17 DIAGNOSIS — R2689 Other abnormalities of gait and mobility: Secondary | ICD-10-CM | POA: Diagnosis not present

## 2016-06-17 DIAGNOSIS — R278 Other lack of coordination: Secondary | ICD-10-CM | POA: Diagnosis not present

## 2016-06-21 DIAGNOSIS — I10 Essential (primary) hypertension: Secondary | ICD-10-CM | POA: Diagnosis not present

## 2016-06-21 DIAGNOSIS — I480 Paroxysmal atrial fibrillation: Secondary | ICD-10-CM | POA: Diagnosis not present

## 2016-06-21 DIAGNOSIS — I872 Venous insufficiency (chronic) (peripheral): Secondary | ICD-10-CM | POA: Diagnosis not present

## 2016-06-21 DIAGNOSIS — Z7901 Long term (current) use of anticoagulants: Secondary | ICD-10-CM | POA: Diagnosis not present

## 2016-06-28 ENCOUNTER — Ambulatory Visit: Payer: No Typology Code available for payment source

## 2016-06-29 ENCOUNTER — Emergency Department (HOSPITAL_COMMUNITY): Payer: Medicare Other

## 2016-06-29 ENCOUNTER — Emergency Department (HOSPITAL_COMMUNITY)
Admission: EM | Admit: 2016-06-29 | Discharge: 2016-06-29 | Disposition: A | Discharge status: Home / Self Care | Payer: Medicare Other | Attending: Emergency Medicine | Admitting: Emergency Medicine

## 2016-06-29 ENCOUNTER — Encounter (HOSPITAL_COMMUNITY): Payer: Self-pay | Admitting: Emergency Medicine

## 2016-06-29 DIAGNOSIS — I10 Essential (primary) hypertension: Secondary | ICD-10-CM | POA: Insufficient documentation

## 2016-06-29 DIAGNOSIS — Z87891 Personal history of nicotine dependence: Secondary | ICD-10-CM | POA: Insufficient documentation

## 2016-06-29 DIAGNOSIS — S0990XA Unspecified injury of head, initial encounter: Secondary | ICD-10-CM

## 2016-06-29 DIAGNOSIS — Y999 Unspecified external cause status: Secondary | ICD-10-CM | POA: Diagnosis not present

## 2016-06-29 DIAGNOSIS — Z91040 Latex allergy status: Secondary | ICD-10-CM | POA: Insufficient documentation

## 2016-06-29 DIAGNOSIS — R51 Headache: Secondary | ICD-10-CM | POA: Diagnosis not present

## 2016-06-29 DIAGNOSIS — Z85828 Personal history of other malignant neoplasm of skin: Secondary | ICD-10-CM | POA: Insufficient documentation

## 2016-06-29 DIAGNOSIS — Y9389 Activity, other specified: Secondary | ICD-10-CM | POA: Diagnosis not present

## 2016-06-29 DIAGNOSIS — S0993XA Unspecified injury of face, initial encounter: Secondary | ICD-10-CM | POA: Diagnosis present

## 2016-06-29 DIAGNOSIS — T148XXA Other injury of unspecified body region, initial encounter: Secondary | ICD-10-CM

## 2016-06-29 DIAGNOSIS — W228XXA Striking against or struck by other objects, initial encounter: Secondary | ICD-10-CM | POA: Diagnosis not present

## 2016-06-29 DIAGNOSIS — S5011XA Contusion of right forearm, initial encounter: Secondary | ICD-10-CM | POA: Diagnosis not present

## 2016-06-29 DIAGNOSIS — Y929 Unspecified place or not applicable: Secondary | ICD-10-CM | POA: Diagnosis not present

## 2016-06-29 DIAGNOSIS — Z7901 Long term (current) use of anticoagulants: Secondary | ICD-10-CM | POA: Diagnosis not present

## 2016-06-29 DIAGNOSIS — S0083XA Contusion of other part of head, initial encounter: Secondary | ICD-10-CM | POA: Diagnosis not present

## 2016-06-29 DIAGNOSIS — S098XXA Other specified injuries of head, initial encounter: Secondary | ICD-10-CM | POA: Diagnosis not present

## 2016-06-29 DIAGNOSIS — S0511XA Contusion of eyeball and orbital tissues, right eye, initial encounter: Secondary | ICD-10-CM | POA: Diagnosis not present

## 2016-06-29 DIAGNOSIS — W19XXXA Unspecified fall, initial encounter: Secondary | ICD-10-CM

## 2016-06-29 NOTE — ED Notes (Signed)
MD at bedside. 

## 2016-06-29 NOTE — ED Notes (Signed)
Patient transported to CT 

## 2016-06-29 NOTE — Discharge Instructions (Signed)
CT of head and face without any acute injuries. Return for any new or worse symptoms. Follow-up with primary care doctor as needed.

## 2016-06-29 NOTE — ED Triage Notes (Signed)
Pt to ER BIB [facility] from [place] where patient lives with husband. Pt coming in for fall that occurred sometime in the night, pt reports a little after midnight was when the fall occurred. Pt fell between the nightstand and wall and hit her face on either of the two, bruising present to right lateral orbit and right lateral face. No changes in vision. Pt is on eliquis. VSS.

## 2016-06-29 NOTE — ED Provider Notes (Signed)
[facility] DEPT Provider Note   CSN: [id number] Arrival date & time: 06/29/16  1336  First Provider Contact:  First MD Initiated Contact with Patient 06/29/16 1346        History   Chief Complaint Chief Complaint  Patient presents with  . Fall    HPI Cathy Dickerson is a 82 y.o. female.  Patient lives with her husband at [facility]. Patient had a fall during the night where she felt between the bed and the wall and the nightstand. Resulting and swelling and bruising to the right side of her face. Also some bruising to her right forearm area no bleeding. No visual changes. Patient is on blood thinners. Patient denies any neck pain or back pain abdominal pain chest pain or trouble breathing. Patient has been [employer] since the fall no pain in the hips or the legs. Patient has a history of atrial fibrillation. Patient's past medical records shows mild cognitive impairment.      Past Medical History:  Diagnosis Date  . Actinic keratoses   . Allergy   . Anxiety   . Arrhythmia    atrial fib  . Atrial fibrillation ([place]) 12/2012  . Basal cell carcinoma of skin   . Cataracts, both eyes 2005, 2013   Cataract extraction/IOL implant  . Current use of long term anticoagulation 12/2012   Eliquis  . Depression   . Diverticulosis 2008  . Fracture of scaphoid bone of hand   . Hyperlipidemia   . Hypertension   . Hyponatremia    SIADH  . Mild cognitive impairment   . Osteopenia   . Rosacea   . Squamous cell carcinoma [facility])     Patient Active Problem List   Diagnosis Date Noted  . Generalized weakness 09/06/2014  . UTI (lower urinary tract infection) 09/06/2014  . Hypertension   . Mild cognitive impairment   . Atrial fibrillation ([place]) 12/22/2012  . Current use of long term anticoagulation 12/22/2012    Past Surgical History:  Procedure Laterality Date  . ABDOMINAL HYSTERECTOMY    . cataracts Bilateral   . CESAREAN SECTION     questionable    OB  History    No data available       Home Medications    Prior to Admission medications   Medication Sig Start Date End Date Taking? Authorizing Provider  apixaban (ELIQUIS) 2.5 MG TABS tablet Take 1 tablet (2.5 mg total) by mouth 2 (two) times daily. 01/04/13   Ripley Fraise, MD  cholecalciferol (VITAMIN D) 1000 UNITS tablet Take 1,000 Units by mouth daily.    Historical Provider, MD  donepezil (ARICEPT) 5 MG tablet Take 5 mg by mouth at bedtime.     Historical Provider, MD  metoprolol succinate (TOPROL-XL) 25 MG 24 hr tablet Take 0.5 tablets (12.5 mg total) by mouth daily. HOLD DOSE TONIGHT Patient taking differently: Take 12.5 mg by mouth daily.  09/07/14   Delfina Redwood, MD  metoprolol tartrate (LOPRESSOR) 25 MG tablet Take 12.5-25 mg by mouth daily as needed (Take 1 tablet if HR 120 or greater. Take 1/2 tablet if HR 110-120).     Historical Provider, MD  Multiple Minerals (CALCIUM-MAGNESIUM-ZINC) TABS Take 1 tablet by mouth daily.    Historical Provider, MD  Multiple Vitamins-Minerals (MULTIVITAMIN PO) Take 1 tablet by mouth daily.    Historical Provider, MD  Probiotic Product (PROBIOTIC PO) Take 1 capsule by mouth every morning.    Historical Provider, MD  sodium chloride  1 G tablet Take 1 g by mouth 2 (two) times daily with a meal.    Historical Provider, MD    Family History Family History  Problem Relation Age of Onset  . Breast cancer Mother   . Colon cancer Sister   . Colon polyps Sister   . Breast cancer Daughter     x 2  . Kidney disease Neg Hx   . Liver disease Neg Hx   . Esophageal cancer Neg Hx   . Pancreatic cancer Neg Hx     Social History Social History  Substance Use Topics  . Smoking status: Former Research scientist (life sciences)  . Smokeless tobacco: Never Used  . Alcohol use 0.0 oz/week     Comment: 1 drink daily     Allergies   Solifenacin; Ciprofloxacin; Latex; Macrobid [nitrofurantoin macrocrystal]; Macrodantin [nitrofurantoin]; Other; Strawberry extract; and  Tramadol   Review of Systems Review of Systems  Constitutional: Negative for fever.  HENT: Negative for congestion.   Eyes: Negative for visual disturbance.  Respiratory: Negative for shortness of breath.   Cardiovascular: Negative for chest pain.  Gastrointestinal: Negative for abdominal pain.  Genitourinary: Negative for dysuria.  Musculoskeletal: Negative for back pain and neck pain.  Neurological: Negative for syncope, speech difficulty and headaches.  Hematological: Bruises/bleeds easily.  Psychiatric/Behavioral: Positive for confusion.     Physical Exam Updated Vital Signs BP 198/65   Pulse (!) 48   Resp 16   SpO2 99%   Physical Exam  Constitutional: She appears well-developed and well-nourished. No distress.  HENT:  Mouth/Throat: Oropharynx is clear and moist.  Swelling to the right cheek and now lateral aspect of the right eye measuring about 4 x 5 cm. Slight bruising to that area. Ecchymosis linear coming out from the lateral aspect of the right eye. No significant lid swelling.  Eyes: Conjunctivae and EOM are normal. Pupils are equal, round, and reactive to light.  No hyphema.  Neck: Normal range of motion. Neck supple.  Cardiovascular: Normal rate and regular rhythm.   No murmur heard. Pulmonary/Chest: Effort normal and breath sounds normal. No respiratory distress.  Abdominal: Soft. Bowel sounds are normal. There is no tenderness.  Musculoskeletal: Normal range of motion. She exhibits no edema or tenderness.  Neurological: She is alert. No cranial nerve deficit. She exhibits normal muscle tone. Coordination normal.  Skin: Skin is warm.  Nursing note and vitals reviewed.    ED Treatments / Results  Labs (all labs ordered are listed, but only abnormal results are displayed) Labs Reviewed - No data to display  EKG  EKG Interpretation None       Radiology Ct Head Wo Contrast  Result Date: 06/29/2016 CLINICAL DATA:  fall that occurred sometime in the  night, pt reports a little after midnight was when the fall occurred. Pt fell between the nightstand and wall and hit her face on either of the two, bruising present to right lateral orbit and right lateral face. No changes in vision. Pt is on eliquis EXAM: CT HEAD WITHOUT CONTRAST CT MAXILLOFACIAL WITHOUT CONTRAST TECHNIQUE: Multidetector CT imaging of the head and maxillofacial structures were performed using the standard protocol without intravenous contrast. Multiplanar CT image reconstructions of the maxillofacial structures were also generated. COMPARISON:  None. FINDINGS: CT HEAD FINDINGS Atherosclerotic and physiologic intracranial calcifications. Diffuse parenchymal atrophy. Patchy areas of hypoattenuation in deep and periventricular white matter bilaterally. Negative for acute intracranial hemorrhage, mass lesion, acute infarction, midline shift, or mass-effect. Acute infarct may be inapparent on noncontrast  CT. Ventricles and sulci symmetric. Bone windows demonstrate no focal lesion. CT MAXILLOFACIAL FINDINGS Orbits and globes intact. Mandible intact. Temporomandibular joints seated. Visualized portions of paranasal sinuses and mastoid air cells appear normally developed and well aerated. Nasal septum midline. Negative for fracture. Multiple dental restorations. New Fusion across the C2-3 facet on the right.Visualized upper cervical spine otherwise unremarkable. IMPRESSION: 1.   1.  Negative for bleed or other acute intracranial process. 2. Atrophy and nonspecific white matter changes. 3. Negative maxillofacial CT 2. Electronically Signed   By: Lucrezia Europe M.D.   On: 06/29/2016 15:32   Ct Maxillofacial Wo Cm  Result Date: 06/29/2016 CLINICAL DATA:  fall that occurred sometime in the night, pt reports a little after midnight was when the fall occurred. Pt fell between the nightstand and wall and hit her face on either of the two, bruising present to right lateral orbit and right lateral face. No changes  in vision. Pt is on eliquis EXAM: CT HEAD WITHOUT CONTRAST CT MAXILLOFACIAL WITHOUT CONTRAST TECHNIQUE: Multidetector CT imaging of the head and maxillofacial structures were performed using the standard protocol without intravenous contrast. Multiplanar CT image reconstructions of the maxillofacial structures were also generated. COMPARISON:  None. FINDINGS: CT HEAD FINDINGS Atherosclerotic and physiologic intracranial calcifications. Diffuse parenchymal atrophy. Patchy areas of hypoattenuation in deep and periventricular white matter bilaterally. Negative for acute intracranial hemorrhage, mass lesion, acute infarction, midline shift, or mass-effect. Acute infarct may be inapparent on noncontrast CT. Ventricles and sulci symmetric. Bone windows demonstrate no focal lesion. CT MAXILLOFACIAL FINDINGS Orbits and globes intact. Mandible intact. Temporomandibular joints seated. Visualized portions of paranasal sinuses and mastoid air cells appear normally developed and well aerated. Nasal septum midline. Negative for fracture. Multiple dental restorations. New Fusion across the C2-3 facet on the right.Visualized upper cervical spine otherwise unremarkable. IMPRESSION: 1.   1.  Negative for bleed or other acute intracranial process. 2. Atrophy and nonspecific white matter changes. 3. Negative maxillofacial CT 2. Electronically Signed   By: Lucrezia Europe M.D.   On: 06/29/2016 15:32    Procedures Procedures (including critical care time)  Medications Ordered in ED Medications - No data to display   Initial Impression / Assessment and Plan / ED Course  I have reviewed the triage vital signs and the nursing notes.  Pertinent labs & imaging results that were available during my care of the patient were reviewed by me and considered in my medical decision making (see chart for details).  Clinical Course   Patient with a fall during the night fell when getting out of bed between the wall and the bed stand.  Patient with contusion and bruising and swelling to the right cheek and right lateral aspect of the eye. The eye itself without evidence of any hyphema or injury. CT of head and face without any bony injury or brain injury. Patient also has some contusions to the right arm. Patient's Selinda Orion no concern for any hip or femur fractures.   Final Clinical Impressions(s) / ED Diagnoses   Final diagnoses:  Fall, initial encounter  Head injury, initial encounter  Contusion of skin    New Prescriptions New Prescriptions   No medications on file     Fredia Sorrow, MD 06/29/16 1557

## 2016-06-30 DIAGNOSIS — Z881 Allergy status to other antibiotic agents status: Secondary | ICD-10-CM | POA: Diagnosis not present

## 2016-06-30 DIAGNOSIS — I10 Essential (primary) hypertension: Secondary | ICD-10-CM | POA: Diagnosis not present

## 2016-06-30 DIAGNOSIS — R197 Diarrhea, unspecified: Secondary | ICD-10-CM | POA: Diagnosis not present

## 2016-06-30 DIAGNOSIS — Z87891 Personal history of nicotine dependence: Secondary | ICD-10-CM | POA: Diagnosis not present

## 2016-06-30 DIAGNOSIS — Z79899 Other long term (current) drug therapy: Secondary | ICD-10-CM | POA: Diagnosis not present

## 2016-06-30 DIAGNOSIS — Z888 Allergy status to other drugs, medicaments and biological substances status: Secondary | ICD-10-CM | POA: Diagnosis not present

## 2016-07-06 ENCOUNTER — Other Ambulatory Visit: Payer: Self-pay | Admitting: Family Medicine

## 2016-07-06 DIAGNOSIS — Z9181 History of falling: Secondary | ICD-10-CM

## 2016-07-07 ENCOUNTER — Ambulatory Visit
Admission: RE | Admit: 2016-07-07 | Discharge: 2016-07-07 | Disposition: A | Discharge status: Home / Self Care | Payer: Medicare Other | Source: Ambulatory Visit | Attending: Family Medicine | Admitting: Family Medicine

## 2016-07-07 DIAGNOSIS — Z9181 History of falling: Secondary | ICD-10-CM

## 2016-07-07 DIAGNOSIS — S0990XA Unspecified injury of head, initial encounter: Secondary | ICD-10-CM | POA: Diagnosis not present

## 2016-07-08 DIAGNOSIS — R2681 Unsteadiness on feet: Secondary | ICD-10-CM | POA: Diagnosis not present

## 2016-07-08 DIAGNOSIS — R278 Other lack of coordination: Secondary | ICD-10-CM | POA: Diagnosis not present

## 2016-07-08 DIAGNOSIS — R2689 Other abnormalities of gait and mobility: Secondary | ICD-10-CM | POA: Diagnosis not present

## 2016-07-08 DIAGNOSIS — F0391 Unspecified dementia with behavioral disturbance: Secondary | ICD-10-CM | POA: Diagnosis not present

## 2016-07-14 DIAGNOSIS — H43813 Vitreous degeneration, bilateral: Secondary | ICD-10-CM | POA: Diagnosis not present

## 2016-07-14 DIAGNOSIS — H5310 Unspecified subjective visual disturbances: Secondary | ICD-10-CM | POA: Diagnosis not present

## 2016-07-18 DIAGNOSIS — F0391 Unspecified dementia with behavioral disturbance: Secondary | ICD-10-CM | POA: Diagnosis not present

## 2016-07-18 DIAGNOSIS — R2689 Other abnormalities of gait and mobility: Secondary | ICD-10-CM | POA: Diagnosis not present

## 2016-07-18 DIAGNOSIS — R278 Other lack of coordination: Secondary | ICD-10-CM | POA: Diagnosis not present

## 2016-07-18 DIAGNOSIS — R2681 Unsteadiness on feet: Secondary | ICD-10-CM | POA: Diagnosis not present

## 2016-07-20 ENCOUNTER — Ambulatory Visit: Payer: No Typology Code available for payment source

## 2016-08-03 ENCOUNTER — Ambulatory Visit
Admission: RE | Admit: 2016-08-03 | Discharge: 2016-08-03 | Disposition: A | Discharge status: Home / Self Care | Payer: Medicare Other | Source: Ambulatory Visit | Attending: Family Medicine | Admitting: Family Medicine

## 2016-08-03 DIAGNOSIS — Z1231 Encounter for screening mammogram for malignant neoplasm of breast: Secondary | ICD-10-CM

## 2016-08-04 DIAGNOSIS — R197 Diarrhea, unspecified: Secondary | ICD-10-CM | POA: Diagnosis not present

## 2016-08-04 DIAGNOSIS — R1030 Lower abdominal pain, unspecified: Secondary | ICD-10-CM | POA: Diagnosis not present

## 2016-08-08 LAB — HM MAMMOGRAPHY

## 2016-08-25 ENCOUNTER — Telehealth: Payer: Self-pay | Admitting: *Deleted

## 2016-08-25 DIAGNOSIS — Z7901 Long term (current) use of anticoagulants: Secondary | ICD-10-CM | POA: Diagnosis not present

## 2016-08-25 DIAGNOSIS — Z79899 Other long term (current) drug therapy: Secondary | ICD-10-CM | POA: Diagnosis not present

## 2016-08-25 DIAGNOSIS — Z888 Allergy status to other drugs, medicaments and biological substances status: Secondary | ICD-10-CM | POA: Diagnosis not present

## 2016-08-25 DIAGNOSIS — Z881 Allergy status to other antibiotic agents status: Secondary | ICD-10-CM | POA: Diagnosis not present

## 2016-08-25 DIAGNOSIS — Z87891 Personal history of nicotine dependence: Secondary | ICD-10-CM | POA: Diagnosis not present

## 2016-08-25 DIAGNOSIS — I129 Hypertensive chronic kidney disease with stage 1 through stage 4 chronic kidney disease, or unspecified chronic kidney disease: Secondary | ICD-10-CM | POA: Diagnosis not present

## 2016-08-25 DIAGNOSIS — I4891 Unspecified atrial fibrillation: Secondary | ICD-10-CM | POA: Diagnosis not present

## 2016-08-25 DIAGNOSIS — Z886 Allergy status to analgesic agent status: Secondary | ICD-10-CM | POA: Diagnosis not present

## 2016-08-25 DIAGNOSIS — R197 Diarrhea, unspecified: Secondary | ICD-10-CM | POA: Diagnosis not present

## 2016-08-25 DIAGNOSIS — N189 Chronic kidney disease, unspecified: Secondary | ICD-10-CM | POA: Diagnosis not present

## 2016-08-25 DIAGNOSIS — F0280 Dementia in other diseases classified elsewhere without behavioral disturbance: Secondary | ICD-10-CM | POA: Diagnosis not present

## 2016-08-25 NOTE — Telephone Encounter (Signed)
Ok to schedule new patient appointment as long as packet is completed and returned to our office by at least one week prior to the appointment.

## 2016-08-25 NOTE — Telephone Encounter (Signed)
-----   Message from Marko Plume sent at 08/24/2016 10:25 AM EDT ----- Regarding: New Patient for [facility] [place]: [phone] Her husband, Mr. Cathy Dickerson would like to make his wife Cathy Dickerson, a new patient appointment with Dr. Mariea Clonts at [facility].  She has a physical slot available on 10/25 @2 :30pm.  I didn't know if Dr. Mariea Clonts would like to use this slot for a new patient or not.  Please like me or the patient know.

## 2016-08-25 NOTE — Telephone Encounter (Signed)
Please advise if we can schedule a new patient appt?

## 2016-08-25 NOTE — Telephone Encounter (Signed)
Spoke with Cathy Dickerson and she will get new patient packet to patient

## 2016-08-26 DIAGNOSIS — N390 Urinary tract infection, site not specified: Secondary | ICD-10-CM | POA: Diagnosis not present

## 2016-08-26 DIAGNOSIS — R3915 Urgency of urination: Secondary | ICD-10-CM | POA: Diagnosis not present

## 2016-08-26 DIAGNOSIS — F0390 Unspecified dementia without behavioral disturbance: Secondary | ICD-10-CM | POA: Diagnosis not present

## 2016-08-30 DIAGNOSIS — Z23 Encounter for immunization: Secondary | ICD-10-CM | POA: Diagnosis not present

## 2016-09-21 DIAGNOSIS — R350 Frequency of micturition: Secondary | ICD-10-CM | POA: Diagnosis not present

## 2016-09-21 DIAGNOSIS — N390 Urinary tract infection, site not specified: Secondary | ICD-10-CM | POA: Diagnosis not present

## 2016-09-21 DIAGNOSIS — R8290 Unspecified abnormal findings in urine: Secondary | ICD-10-CM | POA: Diagnosis not present

## 2016-09-21 DIAGNOSIS — N3281 Overactive bladder: Secondary | ICD-10-CM | POA: Diagnosis not present

## 2016-09-27 DIAGNOSIS — L820 Inflamed seborrheic keratosis: Secondary | ICD-10-CM | POA: Diagnosis not present

## 2016-09-27 DIAGNOSIS — L853 Xerosis cutis: Secondary | ICD-10-CM | POA: Diagnosis not present

## 2016-09-27 DIAGNOSIS — L821 Other seborrheic keratosis: Secondary | ICD-10-CM | POA: Diagnosis not present

## 2016-09-27 DIAGNOSIS — D225 Melanocytic nevi of trunk: Secondary | ICD-10-CM | POA: Diagnosis not present

## 2016-09-27 DIAGNOSIS — Z85828 Personal history of other malignant neoplasm of skin: Secondary | ICD-10-CM | POA: Diagnosis not present

## 2016-09-27 DIAGNOSIS — L570 Actinic keratosis: Secondary | ICD-10-CM | POA: Diagnosis not present

## 2016-10-18 DIAGNOSIS — F3341 Major depressive disorder, recurrent, in partial remission: Secondary | ICD-10-CM | POA: Diagnosis not present

## 2016-10-18 DIAGNOSIS — G301 Alzheimer's disease with late onset: Secondary | ICD-10-CM | POA: Diagnosis not present

## 2016-10-18 DIAGNOSIS — F0280 Dementia in other diseases classified elsewhere without behavioral disturbance: Secondary | ICD-10-CM | POA: Diagnosis not present

## 2016-10-18 DIAGNOSIS — I482 Chronic atrial fibrillation: Secondary | ICD-10-CM | POA: Diagnosis not present

## 2016-10-19 DIAGNOSIS — R8290 Unspecified abnormal findings in urine: Secondary | ICD-10-CM | POA: Diagnosis not present

## 2016-10-19 DIAGNOSIS — N390 Urinary tract infection, site not specified: Secondary | ICD-10-CM | POA: Diagnosis not present

## 2016-11-11 IMAGING — DX DG ANKLE COMPLETE 3+V*L*
3 series · 3 of 3 positions shown · non-contrast
Comparison: None.

CLINICAL DATA: The patient was sent from [HOSPITAL] urgent Center
for evaluation. Hypotension.

EXAM:
LEFT ANKLE COMPLETE - 3+ VIEW

[ankle ap]
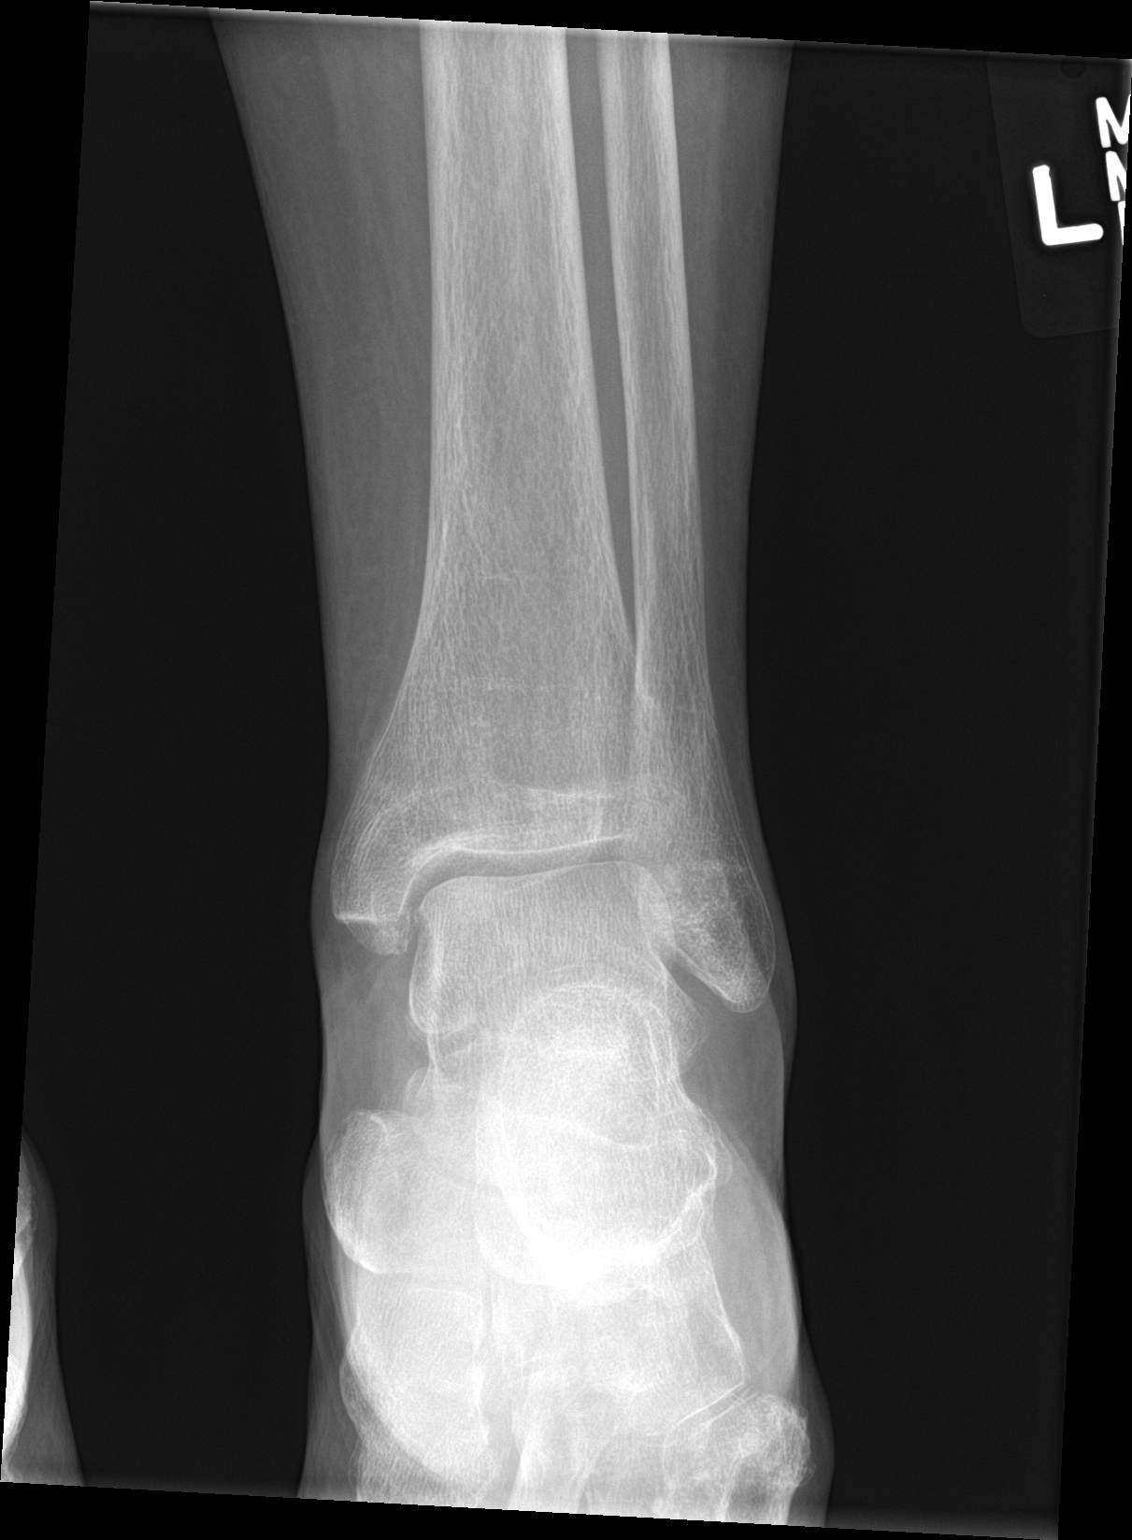

[ankle obl]
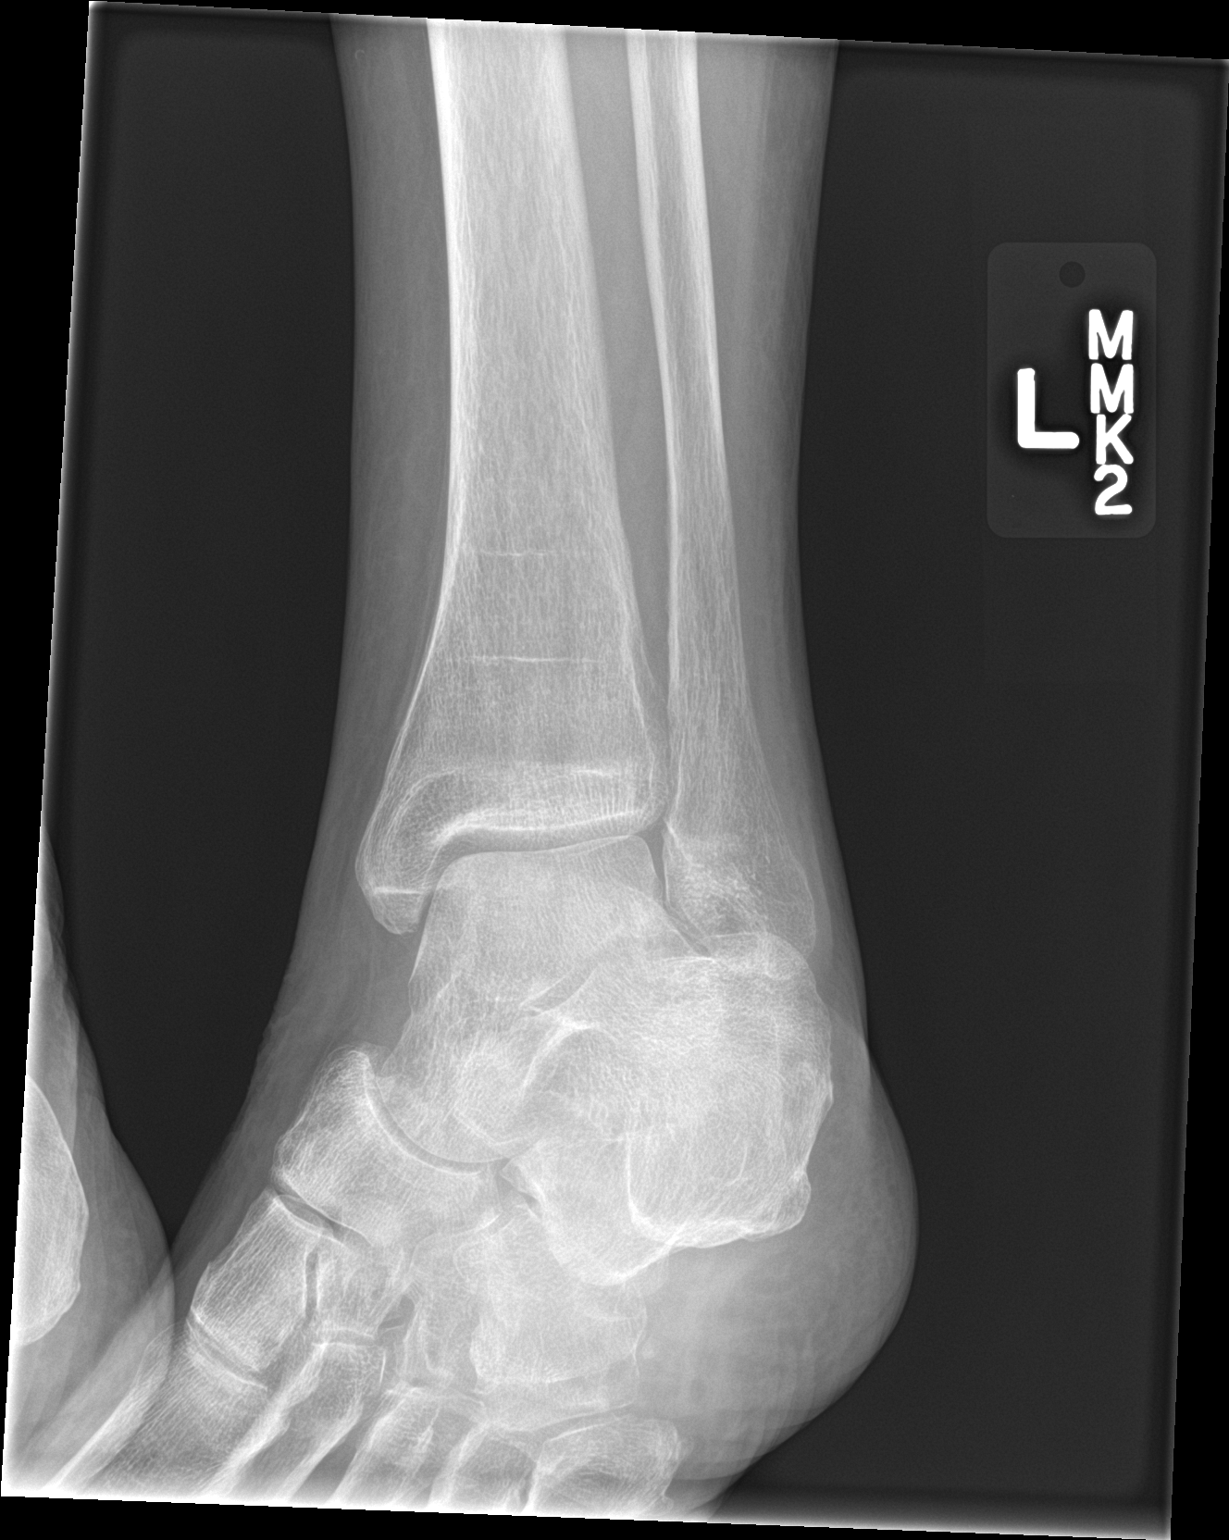

[ankle lat]
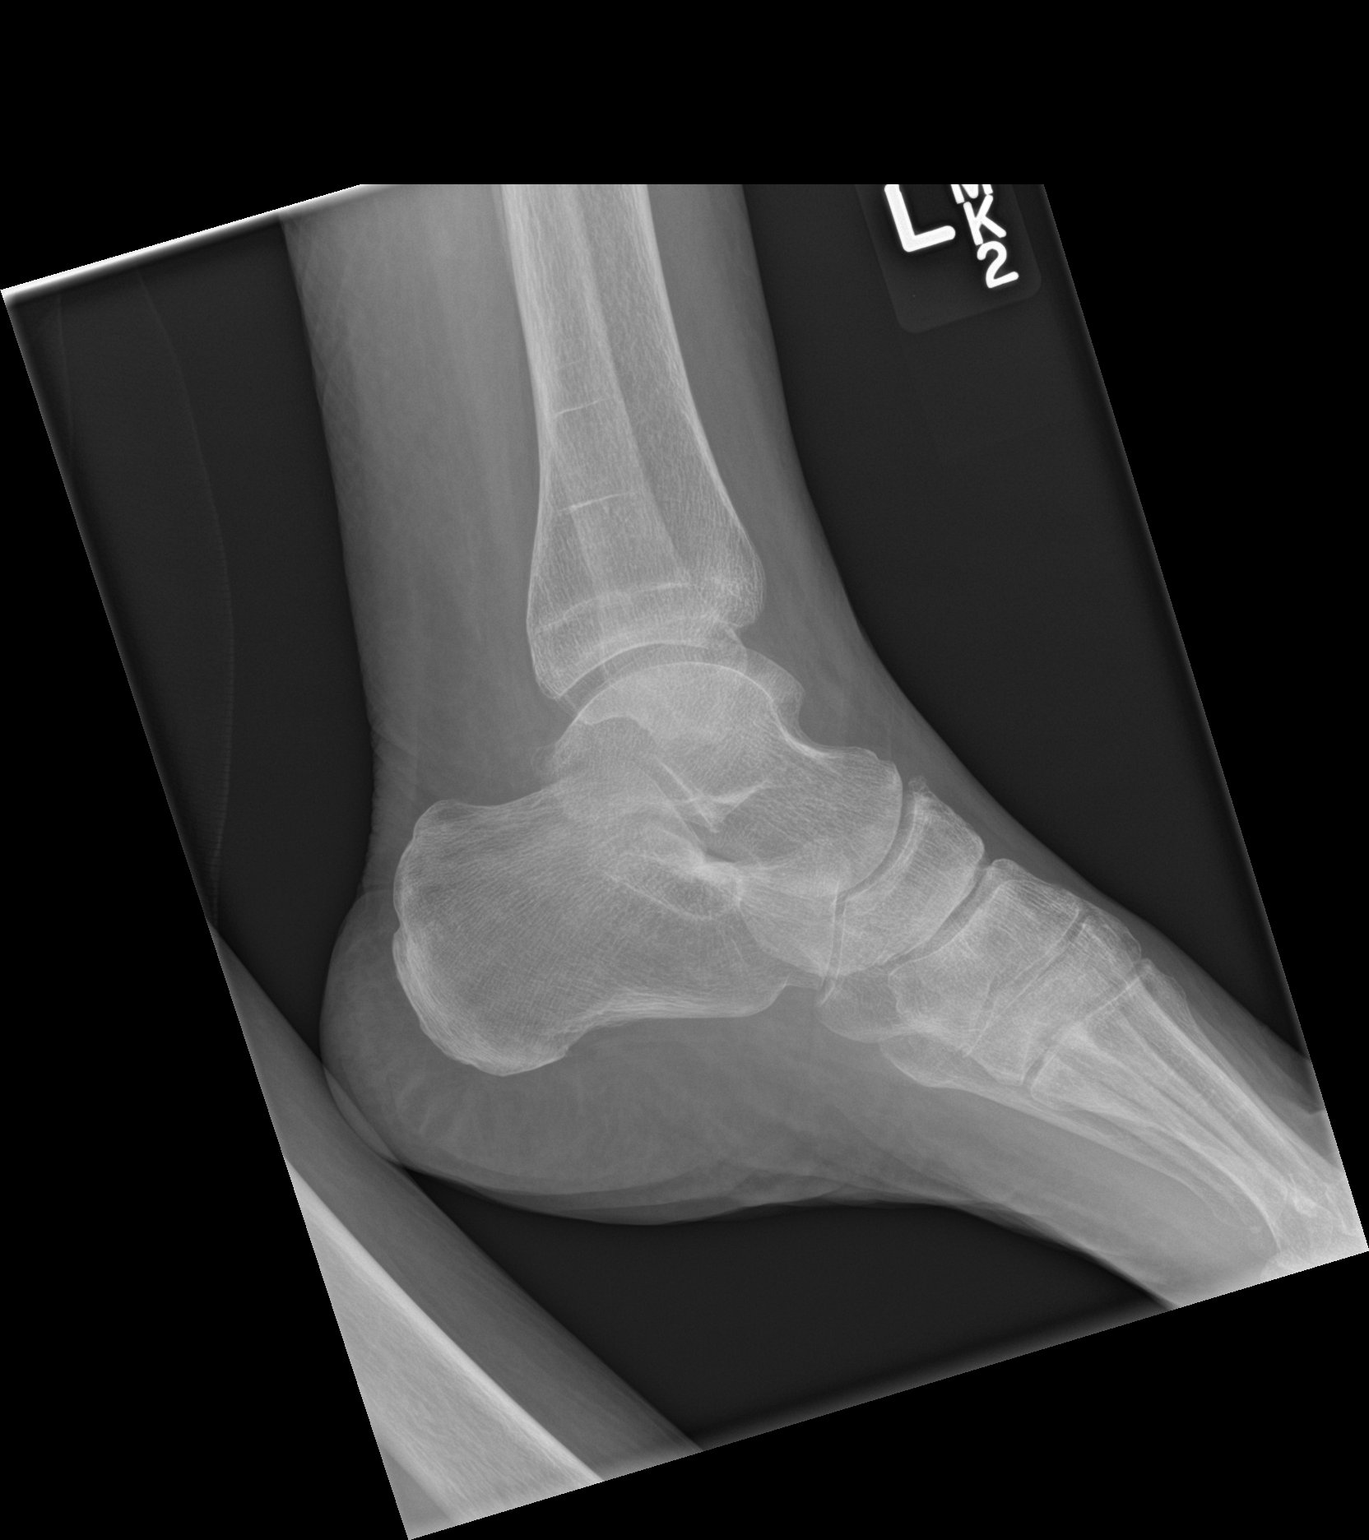

[3 of 3 positions shown; findings below may reference images not displayed]

FINDINGS: There is no evidence of fracture, dislocation, or joint effusion.
Soft tissues are unremarkable.
IMPRESSION: No acute fracture or dislocation.

## 2016-12-16 DIAGNOSIS — Z961 Presence of intraocular lens: Secondary | ICD-10-CM | POA: Diagnosis not present

## 2016-12-16 DIAGNOSIS — H52203 Unspecified astigmatism, bilateral: Secondary | ICD-10-CM | POA: Diagnosis not present

## 2016-12-18 ENCOUNTER — Encounter: Payer: Self-pay | Admitting: Internal Medicine

## 2016-12-18 NOTE — Progress Notes (Signed)
2011 with MMSE 28/30 and had gotten nauseous and dizzy with aricept. Referred to neurology then by [facility] primary.  Actually saw Dr. Dema Severin at [facility] and her vesicare and crestor were stopped and bp meds increased 07/2011.  09/05/16, pt noted to have halllucinations.  MRI brain 12/31/2010 with atrophy and small vessel disease, no acute intracranial findings.

## 2016-12-19 DIAGNOSIS — L570 Actinic keratosis: Secondary | ICD-10-CM | POA: Diagnosis not present

## 2016-12-19 DIAGNOSIS — L821 Other seborrheic keratosis: Secondary | ICD-10-CM | POA: Diagnosis not present

## 2016-12-19 DIAGNOSIS — L820 Inflamed seborrheic keratosis: Secondary | ICD-10-CM | POA: Diagnosis not present

## 2016-12-19 DIAGNOSIS — Z85828 Personal history of other malignant neoplasm of skin: Secondary | ICD-10-CM | POA: Diagnosis not present

## 2016-12-21 ENCOUNTER — Non-Acute Institutional Stay: Payer: Medicare Other | Admitting: Internal Medicine

## 2016-12-21 ENCOUNTER — Encounter: Payer: Self-pay | Admitting: Internal Medicine

## 2016-12-21 VITALS — BP 130/68 | HR 46 | Temp 97.5°F | Ht 63.0 in | Wt 119.0 lb

## 2016-12-21 DIAGNOSIS — I482 Chronic atrial fibrillation, unspecified: Secondary | ICD-10-CM

## 2016-12-21 DIAGNOSIS — I10 Essential (primary) hypertension: Secondary | ICD-10-CM | POA: Diagnosis not present

## 2016-12-21 DIAGNOSIS — R483 Visual agnosia: Secondary | ICD-10-CM

## 2016-12-21 DIAGNOSIS — R531 Weakness: Secondary | ICD-10-CM

## 2016-12-21 DIAGNOSIS — R441 Visual hallucinations: Secondary | ICD-10-CM

## 2016-12-21 DIAGNOSIS — G301 Alzheimer's disease with late onset: Secondary | ICD-10-CM | POA: Diagnosis not present

## 2016-12-21 DIAGNOSIS — Z87440 Personal history of urinary (tract) infections: Secondary | ICD-10-CM | POA: Diagnosis not present

## 2016-12-21 DIAGNOSIS — Z7901 Long term (current) use of anticoagulants: Secondary | ICD-10-CM

## 2016-12-21 DIAGNOSIS — M79605 Pain in left leg: Secondary | ICD-10-CM | POA: Diagnosis not present

## 2016-12-21 DIAGNOSIS — F0281 Dementia in other diseases classified elsewhere with behavioral disturbance: Secondary | ICD-10-CM | POA: Diagnosis not present

## 2016-12-21 DIAGNOSIS — E871 Hypo-osmolality and hyponatremia: Secondary | ICD-10-CM | POA: Diagnosis not present

## 2016-12-21 DIAGNOSIS — F02818 Dementia in other diseases classified elsewhere, unspecified severity, with other behavioral disturbance: Secondary | ICD-10-CM

## 2016-12-21 DIAGNOSIS — M79604 Pain in right leg: Secondary | ICD-10-CM | POA: Diagnosis not present

## 2016-12-21 NOTE — Progress Notes (Signed)
Provider:  Rexene Edison. Mariea Clonts, D.O., C.M.D. Location:  Occupational psychologist of Service:  Clinic (12)  Previous PCP: Antony Blackbird, MD Patient Care Team: Antony Blackbird, MD as PCP - General (Family Medicine) [name], MD as Consulting Physician (Cardiology) Well Ravenna, MD as Consulting Physician (Gastroenterology) [name] [name], MD as Consulting Physician (Dermatology) [name], MD as Consulting Physician (Cardiology) [name], MD as Consulting Physician (Ophthalmology) Roosevelt Locks, NP as Nurse Practitioner (Internal Medicine) [name], MD as Consulting Physician (Gynecology)  Extended Emergency Contact Information Primary Emergency Contact: [facility] Address: [address], [place] [address] [name] of [place] Phone: [phone] Work Phone: [phone] Mobile Phone: [phone] Relation: Spouse  Code Status: DNR Goals of Care: Advanced Directive information Advanced Directives 12/21/2016  Does Patient Have a Medical Advance Directive? Yes  Type of Paramedic of [place];Living will  Does patient want to make changes to medical advance directive? -  Copy of [place] in Chart? Yes  Would patient like information on creating a medical advance directive? -   Chief Complaint  Patient presents with  . Establish Care    New Patient    HPI: Patient is a 83 y.o. female seen today to establish with [facility].  Records have been received from Dr. Siri Cole office.  New pt packet was completed.  I also received a letter from her son with multiple concerns primarily in reference to her Alzheimer's disease.    She says she is doing fine looking after the dog here at [facility]  They live in the [place].    Admits to difficulty remembering.  Has a diagnosis of AD.  She cannot read like she once did.   Eyesight very good, but comprehension not there.  She also cannot operate equipment like the tv, clothes dryer, appliances.  She gets very frustrated and is a good sport about it.   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  She sees and thinks they have multiple dogs.  Trip does not see them.  She gets upset that the dogs are "not getting cared for".  They have caregivers coming in multiple times per week now to give Trip a break.  Wed 12-4 caregiver Fri 8-1 caregiver  Physical health is pretty good.   Had a bout with UTIs--on a prophylactic abx now.They were going through a ton of pads and depends, but now able to wear just a pad in her regular panties (not leaking anymore much at all).    Afib attacks:  Metoprolol handling well.  She had an eye exam last week and Dr. Ellie Lunch was amazed at how well her eyes looked.  Derm visit yesterday.  She gets cold very easily.  They must keep the temp at 78 degrees.    Her lower legs are very very sensitive.  She is taking the vitamin D for that though her husband is not sure it's helped.    Saw Dr. Nyoka Cowden in the past at [facility] about diverticulosis.   Past Medical History:  Diagnosis Date  . Actinic keratoses   . Allergy   . Alzheimer disease 05/21/2015  . Anxiety   . Arrhythmia    atrial fib  . Atrial fibrillation ([place]) 12/2012  . Basal cell carcinoma of skin   . Cataracts, both eyes 2005, 2013  Cataract extraction/IOL implant  . Current use of long term anticoagulation 12/2012   Eliquis  . Depression   . Diverticulosis 2008  . Fracture of scaphoid bone of hand 08/05/2004  . History of UTI   . Hyperlipidemia   . Hypertension   . Hyponatremia    SIADH  . Low sodium levels   . Mild cognitive impairment   . Osteopenia   . Rosacea   . Squamous cell carcinoma   . Thyroid nodule    Past Surgical History:  Procedure Laterality Date  . ABDOMINAL HYSTERECTOMY  1970   TAH-BSO  . cataracts Bilateral 2/05 and 3/05  .  CESAREAN SECTION     questionable    Social History   Social History  . Marital status: Married    Spouse name: N/A  . Number of children: 3  . Years of education: N/A   Occupational History  . Retired    Social History Main Topics  . Smoking status: Former Research scientist (life sciences)  . Smokeless tobacco: Never Used  . Alcohol use 0.0 oz/week     Comment: 1 drink daily  . Drug use: No  . Sexual activity: Not Asked   Other Topics Concern  . None   Social History Narrative   Patient is Married Research scientist (physical sciences)), homemaker. Lives in a single level home, Independent Living section at [facility] since 2013. Lives with spouse, pet dog (McGregor)   Veyo smoking prior to 1980, No  Alcohol history   Patient has  no Advanced planning documents.      Diet?  Well balanced      Do you drink/eat things with caffeine? One 8 oz cup of coffee /day, usually 1/2 decaf      Marital status?         married                          What year were you married? 1997      Do you live in a house, apartment, assisted living, condo, trailer, etc.? [place], [place]      Is it one or more stories?  no      How many persons live in your home? two      Do you have any pets in your home? (please list) yes, one dog, Josie, [place]      Current or past profession: Audiological scientist, [employer]      Do you exercise?       no                               Type & how often?      Do you have a living will? Yes      Do you have a DNR form?       no                           If not, do you want to discuss one?  yes      Do you have signed POA/HPOA for forms?  yes       reports that she has quit smoking. She has never used smokeless tobacco. She reports that she drinks alcohol. She reports that she does not use drugs.  Functional Status Survey:    Family History  Problem Relation Age of Onset  . Breast  cancer Mother   . Crohn's disease Mother   . Stroke Father 69  .  Alzheimer's disease Sister   . Colon cancer Sister   . Colon polyps Sister   . Breast cancer Daughter     x 2  . Graves' disease Daughter   . Alcohol abuse Son   . Stroke Paternal Grandfather   . Graves' disease Daughter   . Breast cancer Daughter   . Breast cancer Daughter   . Kidney disease Neg Hx   . Liver disease Neg Hx   . Esophageal cancer Neg Hx   . Pancreatic cancer Neg Hx     Health Maintenance  Topic Date Due  . ZOSTAVAX  04/05/1989  . DEXA SCAN  04/05/1994  . PNA vac Low Risk Adult (2 of 2 - PCV13) 12/19/2009  . INFLUENZA VACCINE  06/21/2016  . TETANUS/TDAP  01/05/2026    Allergies  Allergen Reactions  . Solifenacin Other (See Comments)  . Ciprofloxacin Other (See Comments)    Reaction unknown  . Latex Other (See Comments)    Reaction unknown  . Macrobid [Nitrofurantoin Macrocrystal] Nausea And Vomiting  . Macrodantin [Nitrofurantoin] Nausea Only    Upset stomach  . Other Other (See Comments)    Popcorn- Diverticulosis  . Strawberry Extract Other (See Comments)    Diverticulosis   . Tramadol Nausea Only    Allergies as of 12/21/2016      Reactions   Solifenacin Other (See Comments)   Ciprofloxacin Other (See Comments)   Reaction unknown   Latex Other (See Comments)   Reaction unknown   Macrobid [nitrofurantoin Macrocrystal] Nausea And Vomiting   Macrodantin [nitrofurantoin] Nausea Only   Upset stomach   Other Other (See Comments)   Popcorn- Diverticulosis   Strawberry Extract Other (See Comments)   Diverticulosis    Tramadol Nausea Only      Medication List       Accurate as of 12/21/16  2:50 PM. Always use your most recent med list.          apixaban 2.5 MG Tabs tablet Commonly known as:  ELIQUIS Take 1 tablet (2.5 mg total) by mouth 2 (two) times daily.   Calcium-Magnesium-Zinc Tabs Take 1 tablet by mouth daily.   cholecalciferol 1000 units tablet Commonly known as:  VITAMIN D Take 1,000 Units by mouth daily.   escitalopram 5  MG tablet Commonly known as:  LEXAPRO Take 5 mg by mouth every other day.   metoprolol succinate 25 MG 24 hr tablet Commonly known as:  TOPROL-XL Take 12.5 mg by mouth daily.   metoprolol tartrate 25 MG tablet Commonly known as:  LOPRESSOR Take 12.5-25 mg by mouth daily as needed (Take 1 tablet if HR 120 or greater. Take 1/2 tablet if HR 110-120).   MULTIVITAMIN PO Take 1 tablet by mouth daily.   PROBIOTIC PO Take 1 capsule by mouth every morning.   sodium chloride 1 g tablet Take 1 g by mouth 2 (two) times daily with a meal.   trimethoprim 100 MG tablet Commonly known as:  TRIMPEX Take 100 mg by mouth daily.       Review of Systems  Constitutional: Negative for chills, diaphoresis, fever, malaise/fatigue and weight loss.  HENT: Negative for congestion and hearing loss.   Eyes: Negative for blurred vision.       Glasses  Respiratory: Negative for cough and shortness of breath.   Cardiovascular: Negative for chest pain, palpitations and leg swelling.  Varicose veins, mild swelling, sensitive legs  Gastrointestinal: Negative for abdominal pain, blood in stool, constipation, diarrhea, heartburn, melena, nausea and vomiting.  Genitourinary: Positive for frequency. Negative for dysuria and urgency.       Very little leakage anymore  Musculoskeletal: Negative for back pain, falls, joint pain and myalgias.  Skin: Negative for itching and rash.  Neurological: Negative for dizziness, tingling, sensory change, loss of consciousness and weakness.  Endo/Heme/Allergies: Bruises/bleeds easily.  Psychiatric/Behavioral: Positive for memory loss. Negative for depression. The patient is not nervous/anxious and does not have insomnia.     Vitals:   12/21/16 1429  BP: 130/68  Pulse: (!) 46  Temp: 97.5 F (36.4 C)  TempSrc: Oral  SpO2: 97%  Weight: 119 lb (54 kg)  Height: 5\' 3"  (1.6 m)   Body mass index is 21.08 kg/m. Physical Exam  Constitutional: She appears  well-developed and well-nourished. No distress.  HENT:  Head: Normocephalic and atraumatic.  Right Ear: External ear normal.  Left Ear: External ear normal.  Nose: Nose normal.  Mouth/Throat: Oropharynx is clear and moist. No oropharyngeal exudate.  Eyes: Conjunctivae and EOM are normal. Pupils are equal, round, and reactive to light.  Neck: Normal range of motion. Neck supple. No JVD present. No thyromegaly present.  Cardiovascular: Normal rate, regular rhythm, normal heart sounds and intact distal pulses.   Varicose veins in ankles, mild nonpitting edema, tenderness present, no erythema or warmth  Pulmonary/Chest: Effort normal and breath sounds normal. No respiratory distress.  Abdominal: Soft. Bowel sounds are normal.  Musculoskeletal: Normal range of motion. She exhibits no tenderness.  Neurological: She is alert.  Oriented to person only, repeats often, not able to provide history  Skin: Skin is warm and dry. Capillary refill takes less than 2 seconds.  Psychiatric: She has a normal mood and affect.    Labs reviewed: Basic Metabolic Panel:  Recent Labs  05/09/16  NA 132*  K 5.4*  BUN 24*  CREATININE 0.9   Liver Function Tests: No results for input(s): AST, ALT, ALKPHOS, BILITOT, PROT, ALBUMIN in the last 8760 hours. No results for input(s): LIPASE, AMYLASE in the last 8760 hours. No results for input(s): AMMONIA in the last 8760 hours. CBC:  Recent Labs  05/09/16  WBC 6.0  HGB 13.4  HCT 40  PLT 201   Cardiac Enzymes: No results for input(s): CKTOTAL, CKMB, CKMBINDEX, TROPONINI in the last 8760 hours. BNP: Invalid input(s): POCBNP No results found for: HGBA1C Lab Results  Component Value Date   TSH 2.430 09/06/2014   Imaging and Procedures noted on new patient packet: Reviewed new patient packet and records and info abstracted.  Also reviewed letter from her husband which will be scanned.  Assessment/Plan 1. Chronic atrial fibrillation (HCC) -stable with  metoprolol and eliquis--plan to f/u cbc and bmp as last labs in records were from June of last year  2. Essential hypertension -bp at goal with current therapy, no changes needed  3. Generalized weakness -actually seemed quite strong during neurologic exam  4. Current use of long term anticoagulation -continues on eliquis therapy for afib  5. Late onset Alzheimer's disease with behavioral disturbance -has AD diagnosis for several years -has severe difficulty with visual hallucinations and agnosia (misinterprets visual cues, difficulty reading and understanding instructions and easily distractable) -last CT was 07/07/16 after a fall and showed stable changes of atrophy and moderate small vessel ischemic change. -MRI in 2012 ordered by [facility] showed Moderate atrophy is present.  There is  extensive chronic microvascular ischemic change throughout the periventricular and subcortical white matter. -her husband manages her medications and cues her continually for adl care -they are now going to have twice weekly caregivers -pt certainly would qualify to already live in the memory care unit but would then have to leave her dog, Josie, with her husband -discussed benefits or more caregiving help at home so her husband can be her husband and staff can help her with personal care needs  6. Visual hallucinations -of them having multiple dogs -pt has good vision so this cannot be due to bad macular and brain imaging does not show strokes in the occipital region -advised it is ok to try the lexapro daily for this in case there's a depression component to her hallucinations   7. Hyponatremia -ongoing, takes two sodium tabs daily, will monitor  8. Pain in both lower extremities -suspect due to varicose veins and some venous insufficiency -does not seem like typical neuropathy and is not improving with current dose of vitamin D--advised to increase to 2000 units daily  9. History of recurrent  UTIs -now on prophylaxis through urology with trimethoprim -also has urge incontinence  10. 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 Dakari Cregger, D.O. [facility] [address], [place] [address] Cell Phone (Mon-Fri 8am-5pm):  [phone] On Call:  [phone] & follow prompts after 5pm & weekends Office Phone:  [phone] Office Fax:  [phone]

## 2016-12-23 DIAGNOSIS — F02818 Dementia in other diseases classified elsewhere, unspecified severity, with other behavioral disturbance: Secondary | ICD-10-CM | POA: Insufficient documentation

## 2016-12-23 DIAGNOSIS — F0281 Dementia in other diseases classified elsewhere with behavioral disturbance: Secondary | ICD-10-CM | POA: Insufficient documentation

## 2016-12-23 DIAGNOSIS — Z87440 Personal history of urinary (tract) infections: Secondary | ICD-10-CM | POA: Insufficient documentation

## 2016-12-23 DIAGNOSIS — M79605 Pain in left leg: Secondary | ICD-10-CM | POA: Insufficient documentation

## 2016-12-23 DIAGNOSIS — R483 Visual agnosia: Secondary | ICD-10-CM | POA: Insufficient documentation

## 2016-12-23 DIAGNOSIS — E871 Hypo-osmolality and hyponatremia: Secondary | ICD-10-CM | POA: Insufficient documentation

## 2016-12-23 DIAGNOSIS — M79604 Pain in right leg: Secondary | ICD-10-CM | POA: Insufficient documentation

## 2016-12-23 DIAGNOSIS — G301 Alzheimer's disease with late onset: Principal | ICD-10-CM

## 2016-12-23 DIAGNOSIS — R441 Visual hallucinations: Secondary | ICD-10-CM | POA: Insufficient documentation

## 2016-12-29 DIAGNOSIS — R350 Frequency of micturition: Secondary | ICD-10-CM | POA: Diagnosis not present

## 2016-12-29 DIAGNOSIS — G3289 Other specified degenerative disorders of nervous system in diseases classified elsewhere: Secondary | ICD-10-CM | POA: Diagnosis not present

## 2016-12-29 DIAGNOSIS — Y93E8 Activity, other personal hygiene: Secondary | ICD-10-CM | POA: Diagnosis not present

## 2016-12-29 DIAGNOSIS — W010XXA Fall on same level from slipping, tripping and stumbling without subsequent striking against object, initial encounter: Secondary | ICD-10-CM | POA: Diagnosis not present

## 2016-12-29 DIAGNOSIS — R296 Repeated falls: Secondary | ICD-10-CM | POA: Diagnosis not present

## 2016-12-29 DIAGNOSIS — R4189 Other symptoms and signs involving cognitive functions and awareness: Secondary | ICD-10-CM | POA: Diagnosis not present

## 2016-12-30 ENCOUNTER — Encounter: Payer: Self-pay | Admitting: Internal Medicine

## 2017-01-02 ENCOUNTER — Other Ambulatory Visit: Payer: Self-pay | Admitting: Internal Medicine

## 2017-01-02 MED ORDER — ESCITALOPRAM OXALATE 10 MG PO TABS
10.0000 mg | ORAL_TABLET | Freq: Every day | ORAL | 3 refills | Status: DC
Start: 2017-01-02 — End: 2017-02-02

## 2017-01-03 DIAGNOSIS — I10 Essential (primary) hypertension: Secondary | ICD-10-CM | POA: Diagnosis not present

## 2017-01-03 DIAGNOSIS — I480 Paroxysmal atrial fibrillation: Secondary | ICD-10-CM | POA: Diagnosis not present

## 2017-01-03 DIAGNOSIS — Z7901 Long term (current) use of anticoagulants: Secondary | ICD-10-CM | POA: Diagnosis not present

## 2017-01-03 DIAGNOSIS — I872 Venous insufficiency (chronic) (peripheral): Secondary | ICD-10-CM | POA: Diagnosis not present

## 2017-01-04 DIAGNOSIS — Z029 Encounter for administrative examinations, unspecified: Secondary | ICD-10-CM

## 2017-01-10 DIAGNOSIS — G3289 Other specified degenerative disorders of nervous system in diseases classified elsewhere: Secondary | ICD-10-CM | POA: Diagnosis not present

## 2017-01-10 DIAGNOSIS — R4189 Other symptoms and signs involving cognitive functions and awareness: Secondary | ICD-10-CM | POA: Diagnosis not present

## 2017-01-10 DIAGNOSIS — W010XXA Fall on same level from slipping, tripping and stumbling without subsequent striking against object, initial encounter: Secondary | ICD-10-CM | POA: Diagnosis not present

## 2017-01-10 DIAGNOSIS — R350 Frequency of micturition: Secondary | ICD-10-CM | POA: Diagnosis not present

## 2017-01-10 DIAGNOSIS — Y93E8 Activity, other personal hygiene: Secondary | ICD-10-CM | POA: Diagnosis not present

## 2017-01-10 DIAGNOSIS — R296 Repeated falls: Secondary | ICD-10-CM | POA: Diagnosis not present

## 2017-01-14 ENCOUNTER — Encounter: Payer: Self-pay | Admitting: Internal Medicine

## 2017-01-17 ENCOUNTER — Encounter: Payer: Self-pay | Admitting: Internal Medicine

## 2017-01-27 ENCOUNTER — Encounter: Payer: Self-pay | Admitting: Internal Medicine

## 2017-01-30 ENCOUNTER — Ambulatory Visit: Payer: Medicare Other | Admitting: Internal Medicine

## 2017-02-01 ENCOUNTER — Encounter: Payer: Self-pay | Admitting: Internal Medicine

## 2017-02-02 ENCOUNTER — Encounter: Payer: Self-pay | Admitting: Internal Medicine

## 2017-02-02 ENCOUNTER — Ambulatory Visit (INDEPENDENT_AMBULATORY_CARE_PROVIDER_SITE_OTHER): Payer: Medicare Other | Admitting: Internal Medicine

## 2017-02-02 VITALS — BP 120/60 | HR 58 | Temp 97.5°F | Wt 118.0 lb

## 2017-02-02 DIAGNOSIS — F02818 Dementia in other diseases classified elsewhere, unspecified severity, with other behavioral disturbance: Secondary | ICD-10-CM

## 2017-02-02 DIAGNOSIS — F0281 Dementia in other diseases classified elsewhere with behavioral disturbance: Secondary | ICD-10-CM | POA: Diagnosis not present

## 2017-02-02 DIAGNOSIS — G301 Alzheimer's disease with late onset: Secondary | ICD-10-CM | POA: Diagnosis not present

## 2017-02-02 DIAGNOSIS — N3941 Urge incontinence: Secondary | ICD-10-CM

## 2017-02-02 DIAGNOSIS — F0391 Unspecified dementia with behavioral disturbance: Secondary | ICD-10-CM

## 2017-02-02 DIAGNOSIS — F0392 Unspecified dementia, unspecified severity, with psychotic disturbance: Secondary | ICD-10-CM

## 2017-02-02 LAB — POCT URINALYSIS DIPSTICK
Bilirubin, UA: NEGATIVE
Blood, UA: NEGATIVE
Glucose, UA: NEGATIVE
Ketones, UA: NEGATIVE
Leukocytes, UA: NEGATIVE
Nitrite, UA: NEGATIVE
Protein, UA: NEGATIVE
Spec Grav, UA: 1.025
Urobilinogen, UA: NEGATIVE
pH, UA: 6.5

## 2017-02-02 MED ORDER — QUETIAPINE FUMARATE 25 MG PO TABS: 12.5000 mg | ORAL_TABLET | Freq: Every day | ORAL | 1 refills | Status: DC

## 2017-02-02 NOTE — Progress Notes (Signed)
Location:  [facility] clinic Provider: Salli Bodin L. Mariea Clonts, D.O., C.M.D.  Code Status: DNR Goals of Care:  Advanced Directives 12/21/2016  Does Patient Have a Medical Advance Directive? Yes  Type of Paramedic of [place];Living will  Does patient want to make changes to medical advance directive? -  Copy of [place] in Chart? Yes  Would patient like information on creating a medical advance directive? -   Chief Complaint  Patient presents with  . Medical Management of Chronic Issues    discuss medications    HPI: Patient is a 83 y.o. female with dementia seen today for an acute visit for worsening difficulty with hallucinations at home.  She continues to see three dogs instead of just one.  She also is having more difficulty recognizing that her husband is indeed her husband.  She thinks there are other men in her house.  She was on lexapro which did not help this at all and it continued to get worse.  She has historically been given an Alzheimer's diagnosis, but her behavior and vivid hallucinations seem more lewy body like.    She is having difficulty sleeping and bad nightmares.  Not all of the time.  She does not remember them.  They are awful and unrelated to her, she says.  She admits to being distressed.  She's not sure that she is distressed about the nightmares.   She felt the lexapro was too strong.  She had discontinued it.    Trip has noticed more energy since she stopped the lexapro.  She will crash at night.  She can get up earlier, is not napping as much (only short naps now--20-45 mins, maybe an hour, not 2 hours).   Caryl Pina is coming in on Wednesdays now and another CNA is planning to now come on Fridays.  Trip does not want to leave her home for 4-5 hrs.     She's had a single incontinence episode where she had a full accident the other night.  This is unusual.  Typically she just has a some leakage.  She is not changing her pad as  often as she should--sometimes it's on more than 24 hrs.  We discussed that it must absolutely be changed in the am and before bedtime (ideally every 6 hrs as Melissa, OT suggested).    Past Medical History:  Diagnosis Date  . Actinic keratoses   . Allergy   . Alzheimer disease 05/21/2015  . Anxiety   . Arrhythmia    atrial fib  . Atrial fibrillation ([place]) 12/2012  . Basal cell carcinoma of skin   . Cataracts, both eyes 2005, 2013   Cataract extraction/IOL implant  . Current use of long term anticoagulation 12/2012   Eliquis  . Depression   . Diverticulosis 2008  . Fracture of scaphoid bone of hand 08/05/2004  . History of UTI   . Hyperlipidemia   . Hypertension   . Hyponatremia    SIADH  . Low sodium levels   . Mild cognitive impairment   . Osteopenia   . Rosacea   . Squamous cell carcinoma   . Thyroid nodule     Past Surgical History:  Procedure Laterality Date  . ABDOMINAL HYSTERECTOMY  1970   TAH-BSO  . cataracts Bilateral 2/05 and 3/05  . CESAREAN SECTION     questionable    Allergies  Allergen Reactions  . Solifenacin Other (See Comments)  . Ciprofloxacin Other (See Comments)  Reaction unknown  . Latex Other (See Comments)    Reaction unknown  . Macrobid [Nitrofurantoin Macrocrystal] Nausea And Vomiting  . Macrodantin [Nitrofurantoin] Nausea Only    Upset stomach  . Other Other (See Comments)    Popcorn- Diverticulosis  . Strawberry Extract Other (See Comments)    Diverticulosis   . Tramadol Nausea Only    Allergies as of 02/02/2017      Reactions   Solifenacin Other (See Comments)   Ciprofloxacin Other (See Comments)   Reaction unknown   Latex Other (See Comments)   Reaction unknown   Macrobid [nitrofurantoin Macrocrystal] Nausea And Vomiting   Macrodantin [nitrofurantoin] Nausea Only   Upset stomach   Other Other (See Comments)   Popcorn- Diverticulosis   Strawberry Extract Other (See Comments)   Diverticulosis    Tramadol Nausea Only        Medication List       Accurate as of 02/02/17  1:31 PM. Always use your most recent med list.          apixaban 2.5 MG Tabs tablet Commonly known as:  ELIQUIS Take 1 tablet (2.5 mg total) by mouth 2 (two) times daily.   Calcium-Magnesium-Zinc Tabs Take 1 tablet by mouth daily.   cholecalciferol 1000 units tablet Commonly known as:  VITAMIN D Take 2,000 Units by mouth daily.   escitalopram 10 MG tablet Commonly known as:  LEXAPRO Take 1 tablet (10 mg total) by mouth daily.   metoprolol succinate 25 MG 24 hr tablet Commonly known as:  TOPROL-XL Take 12.5 mg by mouth daily.   metoprolol tartrate 25 MG tablet Commonly known as:  LOPRESSOR Take 12.5-25 mg by mouth daily as needed (Take 1 tablet if HR 120 or greater. Take 1/2 tablet if HR 110-120).   MULTIVITAMIN PO Take 1 tablet by mouth daily.   PROBIOTIC PO Take 1 capsule by mouth every morning.   sodium chloride 1 g tablet Take 1 g by mouth 2 (two) times daily with a meal.   trimethoprim 100 MG tablet Commonly known as:  TRIMPEX Take 100 mg by mouth daily.       Review of Systems:  Review of Systems  Constitutional: Negative for chills and fever.  Respiratory: Negative for shortness of breath.   Cardiovascular: Negative for chest pain and palpitations.  Genitourinary: Positive for urgency. Negative for dysuria.       Had incontinence episode  Musculoskeletal: Negative for falls.  Psychiatric/Behavioral: Positive for hallucinations and memory loss. Negative for depression.    Health Maintenance  Topic Date Due  . DEXA SCAN  04/05/1994  . PNA vac Low Risk Adult (2 of 2 - PCV13) 12/19/2009  . INFLUENZA VACCINE  06/21/2016  . TETANUS/TDAP  01/05/2026    Physical Exam: There were no vitals filed for this visit. There is no height or weight on file to calculate BMI. Physical Exam  Constitutional: She appears well-developed and well-nourished.  Thin female  Cardiovascular: Normal rate, regular  rhythm, normal heart sounds and intact distal pulses.   Pulmonary/Chest: Effort normal and breath sounds normal. No respiratory distress.  Abdominal: Bowel sounds are normal.  Neurological: She is alert. She displays normal reflexes. No cranial nerve deficit or sensory deficit. She exhibits normal muscle tone. Coordination normal.  Oriented currently to person, but sometimes does not recognize her husband  Skin: Skin is warm and dry.  Psychiatric: She has a normal mood and affect.    Labs reviewed: Basic Metabolic Panel:  Recent Labs  05/09/16  NA 132*  K 5.4*  BUN 24*  CREATININE 0.9   Liver Function Tests: No results for input(s): AST, ALT, ALKPHOS, BILITOT, PROT, ALBUMIN in the last 8760 hours. No results for input(s): LIPASE, AMYLASE in the last 8760 hours. No results for input(s): AMMONIA in the last 8760 hours. CBC:  Recent Labs  05/09/16  WBC 6.0  HGB 13.4  HCT 40  PLT 201    Assessment/Plan 1. Senile dementia with psychosis - needs to try an antipsychotic at this point as her qol and her husband's if getting affected by her hallucinations and she's getting combative and agitated when she thinks a man other than her husband is in the bed at night - QUEtiapine (SEROQUEL) 25 MG tablet; Take 0.5 tablets (12.5 mg total) by mouth at bedtime.  Dispense: 30 tablet; Refill: 1  2. Urge incontinence of urine -had an episode of incontinence that was worse than usual -urine dip was negative  3. Late onset Alzheimer's disease with behavioral disturbance -is getting progressively worse and she's having more disturbing hallucinations--I actually wonder about lewy body disease with the severity of the hallucinations  Labs/tests ordered:   Orders Placed This Encounter  Procedures  . POC Urinalysis Dipstick   Next appt:  03/08/2017  Maximilliano Kersh L. Lannah Koike, D.O. [facility] [address] [place], [place] [address] Cell Phone (Mon-Fri  8am-5pm):  [phone] On Call:  [phone] & follow prompts after 5pm & weekends Office Phone:  [phone] Office Fax:  [phone]

## 2017-02-02 NOTE — Patient Instructions (Signed)
Psychosis Psychosis refers to a severe loss of contact with reality. During a psychotic episode, a person is not able to think clearly, and his or her emotions and responses do not match up with what is actually happening. Someone may have false beliefs about what is happening or who they are (delusions). Someone may see, hear, taste, smell, or feel things that are not present (hallucinations). Psychosis usually occurs with very serious mental health (psychiatric) conditions such as schizophrenia, bipolar disorder, or major depression. It can sometimes also be the result of drug use or certain medical conditions. What are the signs or symptoms? Symptoms of a psychotic episode include:  Delusions, such as:  Feeling excessive fear or suspicion (paranoia).  Believing something that is odd, unrealistic, or false, such as having a false belief about being someone else.  Hallucinations.  Disorganized thinking, such as thoughts that jump from one to another that do not make sense to others.  Disorganized speech, such as saying things that do not make sense to others.  Inappropriate behavior, such as talking to oneself or intruding on unfamiliar people. How is this diagnosed? A diagnosis of psychosis is made through an assessment by a health care provider, who will ask questions about thoughts, feelings, behavior, drug use, and medical conditions. The health care provider may also do one or more of the following:  Physical exam.  Blood tests.  Brain imaging, such as a CT scan or MRI.  Brain wave study (EEG). The health care provider may make a referral for further evaluation by a mental health professional. How is this treated? Treatment depends on the cause of the psychosis. Treatment may include one or more of the following:  Monitoring and supportive care in the emergency room or hospital.  Taking medicines (antipsychotic medicine) to reduce symptoms and to balance chemicals in the  brain.  Treating an underlying medical condition.  Stopping or reducing drugs that are causing psychosis.  Therapy and other supportive programs outside of the hospital. Follow these instructions at home:  Over-the-counter and prescription medicines should be taken only as told by the health care provider.  The health care provider should be consulted before over-the-counter medicines, herbs, or supplements are used.  All follow-up visits should be kept as told by the health care provider. This is important.  A healthy lifestyle should be maintained. This includes:  Eating a healthy diet.  Getting enough sleep.  Exercising regularly.  Avoiding alcohol and recreational drugs as told by the health care provider. Contact a health care provider if:  Medicines do not seem to be helping.  The person hears voices telling him or her to do things.  The person continues to see, smell, or feel things that are not there.  The person feels extremely fearful and suspicious that someone or something will harm him or her.  The person feels unable to leave his or her house.  The person has trouble taking care of himself or herself.  The person experiences side effects of medicines, such as:  Changes in sleep patterns.  Dizziness.  Weight gain.  Restlessness.  Movement changes.  Muscle spasms.  Tremors. Get help right away if:  Serious thoughts occur about self-harm or about hurting others.  There are serious side effects of medicine, such as:  Swelling of the face, lips, tongue, or throat.  Fever, confusion, muscle spasms, or seizures. This information is not intended to replace advice given to you by your health care provider. Make sure you  discuss any questions you have with your health care provider. Document Released: 04/27/2010 Document Revised: 04/14/2016 Document Reviewed: 11/11/2014 Elsevier Interactive Patient Education  2017 [employer].

## 2017-02-06 ENCOUNTER — Encounter: Payer: Self-pay | Admitting: Internal Medicine

## 2017-02-07 ENCOUNTER — Encounter: Payer: Self-pay | Admitting: Internal Medicine

## 2017-02-15 DIAGNOSIS — Z029 Encounter for administrative examinations, unspecified: Secondary | ICD-10-CM

## 2017-02-27 ENCOUNTER — Encounter: Payer: Self-pay | Admitting: Internal Medicine

## 2017-03-01 ENCOUNTER — Encounter: Payer: Self-pay | Admitting: Internal Medicine

## 2017-03-08 ENCOUNTER — Encounter: Payer: Self-pay | Admitting: Internal Medicine

## 2017-03-08 ENCOUNTER — Non-Acute Institutional Stay: Payer: Medicare Other | Admitting: Internal Medicine

## 2017-03-08 VITALS — BP 138/70 | HR 52 | Temp 97.8°F | Wt 121.0 lb

## 2017-03-08 DIAGNOSIS — R483 Visual agnosia: Secondary | ICD-10-CM

## 2017-03-08 DIAGNOSIS — G301 Alzheimer's disease with late onset: Secondary | ICD-10-CM | POA: Diagnosis not present

## 2017-03-08 DIAGNOSIS — F02818 Alzheimer's disease with late onset: Secondary | ICD-10-CM

## 2017-03-08 DIAGNOSIS — F0281 Dementia in other diseases classified elsewhere with behavioral disturbance: Secondary | ICD-10-CM

## 2017-03-08 DIAGNOSIS — R441 Visual hallucinations: Secondary | ICD-10-CM | POA: Diagnosis not present

## 2017-03-08 DIAGNOSIS — N3941 Urge incontinence: Secondary | ICD-10-CM | POA: Insufficient documentation

## 2017-03-08 MED ORDER — QUETIAPINE FUMARATE 50 MG PO TABS: 50.0000 mg | ORAL_TABLET | Freq: Every day | ORAL | 3 refills | Status: DC

## 2017-03-08 NOTE — Progress Notes (Signed)
Location:  [facility] clinic Provider:  Sheela Mcculley L. Mariea Clonts, D.O., C.M.D.  Code Status: DNR Goals of Care:  Advanced Directives 03/08/2017  Does Patient Have a Medical Advance Directive? Yes  Type of Paramedic of [place];Living will  Does patient want to make changes to medical advance directive? -  Copy of [place] in Chart? Yes  Would patient like information on creating a medical advance directive? -   Chief Complaint  Patient presents with  . Medical Management of Chronic Issues    90mth follow-up    HPI: Patient is a 83 y.o. female seen today for medical management of chronic diseases.  Trip has been sending messages via Pharmacist, community and emails to Butch Penny, Education officer, museum, which have been quite informative about Cela's progressing dementia.  They traveled to the beach.  There were extra disorientation problems upon arrival and she was refusing medications.  This remains a problem at times even here.    She thinks she has not seen her husband for a full day.  She says she was at another house.  She says it was convoluted.  No others came and went.  He was home the whole time.  Says she's doing better with sleeping at night.  She is having dreams--they are frightening to her.  She does not remember them.      Says she is occasionally lightheaded.  Gets worried when she's alone and would like her husband to be with her.    Has been on the seroquel 25mg  now for 9 days and continues to get worse anyway.  Still thinks her husband is another man at times and that they have multiple dogs, not just one.  She admits to being virtually unable to read at all.  Has visual agnosia.  Trip sent a message today to the social worker.  They laid down to take a nap yesterday and she did not know where they were when they awoke.  She asks him a series of questions every time--who he is, his bday, when they married, why they live where they do, etc..  She was wanting him to  be there at night.  She is often different after a nap from when she wakes back up.  She does not like caregivers to be there or her husband to leave.    He also reports that she is still not changing her pads properly for her urinary incontinence despite caregiver hours to help with this.  She id doing a bit better with Neoma Laming, but she will have an 11 day vacation which will be a challenge.  Past Medical History:  Diagnosis Date  . Actinic keratoses   . Allergy   . Alzheimer disease 05/21/2015  . Anxiety   . Arrhythmia    atrial fib  . Atrial fibrillation ([place]) 12/2012  . Basal cell carcinoma of skin   . Cataracts, both eyes 2005, 2013   Cataract extraction/IOL implant  . Current use of long term anticoagulation 12/2012   Eliquis  . Depression   . Diverticulosis 2008  . Fracture of scaphoid bone of hand 08/05/2004  . History of UTI   . Hyperlipidemia   . Hypertension   . Hyponatremia    SIADH  . Low sodium levels   . Mild cognitive impairment   . Osteopenia   . Rosacea   . Squamous cell carcinoma   . Thyroid nodule     Past Surgical History:  Procedure Laterality Date  .  ABDOMINAL HYSTERECTOMY  1970   TAH-BSO  . cataracts Bilateral 2/05 and 3/05  . CESAREAN SECTION     questionable    Allergies  Allergen Reactions  . Solifenacin Other (See Comments)  . Ciprofloxacin Other (See Comments)    Reaction unknown  . Latex Other (See Comments)    Reaction unknown  . Macrobid [Nitrofurantoin Macrocrystal] Nausea And Vomiting  . Macrodantin [Nitrofurantoin] Nausea Only    Upset stomach  . Other Other (See Comments)    Popcorn- Diverticulosis  . Strawberry Extract Other (See Comments)    Diverticulosis   . Tramadol Nausea Only    Allergies as of 03/08/2017      Reactions   Solifenacin Other (See Comments)   Ciprofloxacin Other (See Comments)   Reaction unknown   Latex Other (See Comments)   Reaction unknown   Macrobid [nitrofurantoin Macrocrystal] Nausea And  Vomiting   Macrodantin [nitrofurantoin] Nausea Only   Upset stomach   Other Other (See Comments)   Popcorn- Diverticulosis   Strawberry Extract Other (See Comments)   Diverticulosis    Tramadol Nausea Only      Medication List       Accurate as of 03/08/17  3:47 PM. Always use your most recent med list.          apixaban 2.5 MG Tabs tablet Commonly known as:  ELIQUIS Take 1 tablet (2.5 mg total) by mouth 2 (two) times daily.   Calcium-Magnesium-Zinc Tabs Take 1 tablet by mouth daily.   cholecalciferol 1000 units tablet Commonly known as:  VITAMIN D Take 2,000 Units by mouth daily.   metoprolol succinate 25 MG 24 hr tablet Commonly known as:  TOPROL-XL Take 12.5 mg by mouth daily.   metoprolol tartrate 25 MG tablet Commonly known as:  LOPRESSOR Take 12.5-25 mg by mouth daily as needed (Take 1 tablet if HR 120 or greater. Take 1/2 tablet if HR 110-120).   MULTIVITAMIN PO Take 1 tablet by mouth daily.   PROBIOTIC PO Take 1 capsule by mouth every morning.   QUEtiapine 25 MG tablet Commonly known as:  SEROQUEL Take 25 mg by mouth at bedtime.   sodium chloride 1 g tablet Take 1 g by mouth 2 (two) times daily with a meal.   trimethoprim 100 MG tablet Commonly known as:  TRIMPEX Take 100 mg by mouth daily.       Review of Systems:  Review of Systems  Unable to perform ROS: Dementia (see HPI)    Health Maintenance  Topic Date Due  . DEXA SCAN  04/05/1994  . PNA vac Low Risk Adult (2 of 2 - PCV13) 12/19/2009  . INFLUENZA VACCINE  06/21/2017  . TETANUS/TDAP  01/05/2026    Physical Exam: Vitals:   03/08/17 1533  BP: 138/70  Pulse: (!) 52  Temp: 97.8 F (36.6 C)  TempSrc: Oral  SpO2: 96%  Weight: 121 lb (54.9 kg)   Body mass index is 21.43 kg/m. Physical Exam  Constitutional: She appears well-developed and well-nourished. No distress.  Thin white female  Cardiovascular: Normal rate, regular rhythm, normal heart sounds and intact distal pulses.     Pulmonary/Chest: Effort normal and breath sounds normal. No respiratory distress.  Musculoskeletal: Normal range of motion. She exhibits no tenderness.  Gait is slow short steps, difficulty lifting feet, husband supports her and when she stops, she leans on the wall or chooses to sit in a chair rather than stand alone  Neurological: She is alert.  Skin: Skin is  warm and dry.  Psychiatric: She has a normal mood and affect.  Very pleasant, very poor historian, relies on husband to help    Labs reviewed: Basic Metabolic Panel:  Recent Labs  05/09/16  NA 132*  K 5.4*  BUN 24*  CREATININE 0.9   Liver Function Tests: No results for input(s): AST, ALT, ALKPHOS, BILITOT, PROT, ALBUMIN in the last 8760 hours. No results for input(s): LIPASE, AMYLASE in the last 8760 hours. No results for input(s): AMMONIA in the last 8760 hours. CBC:  Recent Labs  05/09/16  WBC 6.0  HGB 13.4  HCT 40  PLT 201    Assessment/Plan 1. 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 Urge incontinence of urine -cont to change pad at least twice a day to prevent utis as best resident allows  4. Visual hallucinations -ongoing, as discussed, increase seroquel to 50mg  po daily   Labs/tests ordered:  No new Next appt:  04/12/2017   Havish Petties L. Langston Summerfield, D.O. [facility] [address], [place] [address] Cell Phone (Mon-Fri 8am-5pm):  [phone] On Call:  [phone] & follow prompts after 5pm & weekends Office Phone:  [phone] Office Fax:  [phone]

## 2017-03-28 DIAGNOSIS — L570 Actinic keratosis: Secondary | ICD-10-CM | POA: Diagnosis not present

## 2017-03-28 DIAGNOSIS — L821 Other seborrheic keratosis: Secondary | ICD-10-CM | POA: Diagnosis not present

## 2017-03-28 DIAGNOSIS — Z85828 Personal history of other malignant neoplasm of skin: Secondary | ICD-10-CM | POA: Diagnosis not present

## 2017-04-10 ENCOUNTER — Encounter: Payer: Self-pay | Admitting: Internal Medicine

## 2017-04-12 ENCOUNTER — Encounter: Payer: Self-pay | Admitting: Internal Medicine

## 2017-04-12 DIAGNOSIS — Z79899 Other long term (current) drug therapy: Secondary | ICD-10-CM | POA: Diagnosis not present

## 2017-04-12 DIAGNOSIS — Z87440 Personal history of urinary (tract) infections: Secondary | ICD-10-CM | POA: Diagnosis not present

## 2017-04-12 DIAGNOSIS — R54 Age-related physical debility: Secondary | ICD-10-CM | POA: Diagnosis not present

## 2017-04-12 DIAGNOSIS — F0390 Unspecified dementia without behavioral disturbance: Secondary | ICD-10-CM | POA: Diagnosis not present

## 2017-04-13 ENCOUNTER — Encounter: Payer: Self-pay | Admitting: Internal Medicine

## 2017-04-14 ENCOUNTER — Encounter: Payer: Self-pay | Admitting: Internal Medicine

## 2017-04-14 ENCOUNTER — Ambulatory Visit (INDEPENDENT_AMBULATORY_CARE_PROVIDER_SITE_OTHER): Payer: Medicare Other | Admitting: Internal Medicine

## 2017-04-14 VITALS — BP 120/78 | HR 55 | Temp 97.5°F | Wt 117.0 lb

## 2017-04-14 DIAGNOSIS — E042 Nontoxic multinodular goiter: Secondary | ICD-10-CM | POA: Diagnosis not present

## 2017-04-14 DIAGNOSIS — N3941 Urge incontinence: Secondary | ICD-10-CM

## 2017-04-14 DIAGNOSIS — I482 Chronic atrial fibrillation, unspecified: Secondary | ICD-10-CM

## 2017-04-14 DIAGNOSIS — R441 Visual hallucinations: Secondary | ICD-10-CM

## 2017-04-14 DIAGNOSIS — E871 Hypo-osmolality and hyponatremia: Secondary | ICD-10-CM

## 2017-04-14 DIAGNOSIS — R483 Visual agnosia: Secondary | ICD-10-CM

## 2017-04-14 DIAGNOSIS — F02818 Dementia in other diseases classified elsewhere, unspecified severity, with other behavioral disturbance: Secondary | ICD-10-CM

## 2017-04-14 DIAGNOSIS — G301 Alzheimer's disease with late onset: Secondary | ICD-10-CM | POA: Diagnosis not present

## 2017-04-14 DIAGNOSIS — F0281 Dementia in other diseases classified elsewhere with behavioral disturbance: Secondary | ICD-10-CM

## 2017-04-14 DIAGNOSIS — Z23 Encounter for immunization: Secondary | ICD-10-CM

## 2017-04-14 LAB — CBC WITH DIFFERENTIAL/PLATELET
Basophils Absolute: 0 cells/uL (ref 0–200)
Basophils Relative: 0 %
Eosinophils Absolute: 66 cells/uL (ref 15–500)
Eosinophils Relative: 1 %
HCT: 38.1 % (ref 35.0–45.0)
Hemoglobin: 13.8 g/dL (ref 11.7–15.5)
Lymphocytes Relative: 28 %
Lymphs Abs: 1848 cells/uL (ref 850–3900)
MCH: 33.9 pg — ABNORMAL HIGH (ref 27.0–33.0)
MCHC: 36.2 g/dL — ABNORMAL HIGH (ref 32.0–36.0)
MCV: 93.6 fL (ref 80.0–100.0)
MPV: 10.6 fL (ref 7.5–12.5)
Monocytes Absolute: 462 cells/uL (ref 200–950)
Monocytes Relative: 7 %
Neutro Abs: 4224 cells/uL (ref 1500–7800)
Neutrophils Relative %: 64 %
Platelets: 205 10*3/uL (ref 140–400)
RBC: 4.07 MIL/uL (ref 3.80–5.10)
RDW: 13.3 % (ref 11.0–15.0)
WBC: 6.6 10*3/uL (ref 3.8–10.8)

## 2017-04-14 MED ORDER — ZOSTER VAC RECOMB ADJUVANTED 50 MCG/0.5ML IM SUSR
0.5000 mL | Freq: Once | INTRAMUSCULAR | 1 refills | Status: AC
Start: 2017-04-14 — End: 2017-04-14

## 2017-04-14 NOTE — Progress Notes (Signed)
Location:  [facility] clinic Provider:  Burlene Montecalvo L. Mariea Clonts, D.O., C.M.D.  Code Status: DNR Goals of Care:  Advanced Directives 04/14/2017  Does Patient Have a Medical Advance Directive? Yes  Type of Paramedic of [place];Living will  Does patient want to make changes to medical advance directive? -  Copy of [place] in Chart? Yes  Would patient like information on creating a medical advance directive? -     Chief Complaint  Patient presents with  . Follow-up    5 week follow-up    HPI: Patient is a 83 y.o. female seen today for medical management of chronic diseases.    They are having a bad week.  Is to go to the beach 6/17 with whole family 20 people.  She does not think she can handle going.  She reports she is good during the day, but she has her hallucinations at night.  She had a bad dream last night.  From 6-10pm last night, they discussed where they lived before this and she was wanting to go back there--they did not actually ever live in that place.  Sundowning is bad.  She has more difficulty recognizing her husband over time.  It scares her.  She then realizes who he is and gets scared that the other man is on the loose.    They had visited [facility] memory care with Butch Penny Monday.  Pt does not remember the visit at all.  She said she'd run away if she had to go there (to Trip apparently).  She does not like to be left alone and feels frightened.   Josie is a comfort dog for her.     Past Medical History:  Diagnosis Date  . Actinic keratoses   . Allergy   . Alzheimer disease 05/21/2015  . Anxiety   . Arrhythmia    atrial fib  . Atrial fibrillation ([place]) 12/2012  . Basal cell carcinoma of skin   . Cataracts, both eyes 2005, 2013   Cataract extraction/IOL implant  . Current use of long term anticoagulation 12/2012   Eliquis  . Depression   . Diverticulosis 2008  . Fracture of scaphoid bone of hand 08/05/2004  . History of UTI     . Hyperlipidemia   . Hypertension   . Hyponatremia    SIADH  . Low sodium levels   . Mild cognitive impairment   . Osteopenia   . Rosacea   . Squamous cell carcinoma   . Thyroid nodule     Past Surgical History:  Procedure Laterality Date  . ABDOMINAL HYSTERECTOMY  1970   TAH-BSO  . cataracts Bilateral 2/05 and 3/05  . CESAREAN SECTION     questionable    Allergies  Allergen Reactions  . Solifenacin Other (See Comments)  . Ciprofloxacin Other (See Comments)    Reaction unknown  . Latex Other (See Comments)    Reaction unknown  . Macrobid [Nitrofurantoin Macrocrystal] Nausea And Vomiting  . Macrodantin [Nitrofurantoin] Nausea Only    Upset stomach  . Other Other (See Comments)    Popcorn- Diverticulosis  . Strawberry Extract Other (See Comments)    Diverticulosis   . Tramadol Nausea Only    Allergies as of 04/14/2017      Reactions   Solifenacin Other (See Comments)   Ciprofloxacin Other (See Comments)   Reaction unknown   Latex Other (See Comments)   Reaction unknown   Macrobid [nitrofurantoin Macrocrystal] Nausea And Vomiting  Macrodantin [nitrofurantoin] Nausea Only   Upset stomach   Other Other (See Comments)   Popcorn- Diverticulosis   Strawberry Extract Other (See Comments)   Diverticulosis    Tramadol Nausea Only      Medication List       Accurate as of 04/14/17 11:32 AM. Always use your most recent med list.          apixaban 2.5 MG Tabs tablet Commonly known as:  ELIQUIS Take 1 tablet (2.5 mg total) by mouth 2 (two) times daily.   Calcium-Magnesium-Zinc Tabs Take 1 tablet by mouth daily.   cholecalciferol 1000 units tablet Commonly known as:  VITAMIN D Take 2,000 Units by mouth daily.   metoprolol succinate 25 MG 24 hr tablet Commonly known as:  TOPROL-XL Take 12.5 mg by mouth daily.   metoprolol tartrate 25 MG tablet Commonly known as:  LOPRESSOR Take 12.5-25 mg by mouth daily as needed (Take 1 tablet if HR 120 or greater.  Take 1/2 tablet if HR 110-120).   MULTIVITAMIN PO Take 1 tablet by mouth daily.   PROBIOTIC PO Take 1 capsule by mouth every morning.   QUEtiapine 50 MG tablet Commonly known as:  SEROQUEL Take 1 tablet (50 mg total) by mouth at bedtime.   sodium chloride 1 g tablet Take 1 g by mouth 2 (two) times daily with a meal.   trimethoprim 100 MG tablet Commonly known as:  TRIMPEX Take 100 mg by mouth daily.       Review of Systems:  Review of Systems  Constitutional: Negative for chills, fever, malaise/fatigue and weight loss.  HENT: Negative for congestion.   Eyes:       Unable to read but visual agnosia  Respiratory: Negative for cough and shortness of breath.   Cardiovascular: Negative for chest pain, palpitations and leg swelling.  Gastrointestinal: Negative for abdominal pain, blood in stool, constipation and melena.  Genitourinary: Negative for dysuria.  Musculoskeletal: Negative for falls and myalgias.       Gait is getting more unsteady  Skin: Negative for itching and rash.  Neurological: Negative for dizziness, loss of consciousness and weakness.  Psychiatric/Behavioral: Positive for depression, hallucinations and memory loss. Negative for suicidal ideas. The patient is not nervous/anxious and does not have insomnia.     Health Maintenance  Topic Date Due  . DEXA SCAN  04/05/1994  . PNA vac Low Risk Adult (2 of 2 - PCV13) 12/19/2009  . INFLUENZA VACCINE  06/21/2017  . TETANUS/TDAP  01/05/2026    Physical Exam: Vitals:   04/14/17 1114  BP: 120/78  Pulse: (!) 55  Temp: 97.5 F (36.4 C)  TempSrc: Oral  SpO2: 97%  Weight: 117 lb (53.1 kg)   Body mass index is 20.73 kg/m. Physical Exam  Constitutional:  Thin female  Cardiovascular: Normal rate, regular rhythm, normal heart sounds and intact distal pulses.   Pulmonary/Chest: Effort normal and breath sounds normal. No respiratory distress.  Abdominal: Bowel sounds are normal.  Musculoskeletal: Normal range  of motion.  Ambulates unsteadily with short steps, uses husband's arm for support  Neurological: She is alert.  Oriented to person only and currently knows Trip is her husband, but does not always  Skin: Skin is warm and dry.  Psychiatric:  More down today    Labs reviewed: Basic Metabolic Panel:  Recent Labs  05/09/16  NA 132*  K 5.4*  BUN 24*  CREATININE 0.9   Liver Function Tests: No results for input(s): AST, ALT, ALKPHOS,  BILITOT, PROT, ALBUMIN in the last 8760 hours. No results for input(s): LIPASE, AMYLASE in the last 8760 hours. No results for input(s): AMMONIA in the last 8760 hours. CBC:  Recent Labs  05/09/16  WBC 6.0  HGB 13.4  HCT 40  PLT 201   Lipid Panel: No results for input(s): CHOL, HDL, LDLCALC, TRIG, CHOLHDL, LDLDIRECT in the last 8760 hours. No results found for: HGBA1C  Procedures since last visit: No results found.  Assessment/Plan 1. Late onset Alzheimer's disease with behavioral disturbance -I'm suspicious there may be Lewy body disease, but she does not seem to have the right gait and orthostasis that go with it -not on meds for this -needs memory care unit -has caregiver time at home, but inadequate and her husband is clearly stressed -they have plans to go to the beach again which will probably not go well, but she wants to stay home and the family members want to see her -decreased pill burden by stopping mvis, calcium, seroquel -encouraged chewables of the vitamins and probiotics so maybe she'll take them  2. Visual hallucinations -ongoing, it seems like seroquel has not done any good (though no clear harm either) -d/c seroquel  3. Urge incontinence of urine -ongoing, encouraged her to change her pads more often, but again, needs skilled nursing to help with this - COMPLETE METABOLIC PANEL WITH GFR  4. Visual agnosia -interferes with usual visual cues we might use to remind most dementia patients   5. Hyponatremia - not taking  sodium tabs as directed, f/u labs  - CBC with Differential/Platelet - COMPLETE METABOLIC PANEL WITH GFR  6. Chronic atrial fibrillation (HCC) -cont eliquis and metoprolol which are small  7. Multiple thyroid nodules - TSH  8. Need for shingles vaccine -discussed shingrix and Rx sent to pharmacy  9. Need for vaccination with 13-polyvalent pneumococcal conjugate vaccine -prevnar given  Labs/tests ordered:   Orders Placed This Encounter  Procedures  . Pneumococcal conjugate vaccine 13-valent  . CBC with Differential/Platelet  . COMPLETE METABOLIC PANEL WITH GFR  . TSH   Next appt:  Prn--I've heard there are plans to move her to memory care and I would follow her there instead of clinic if that occurs   Townsend Cudworth L. Malika Demario, D.O. [facility] [address], [place] [address] Cell Phone (Mon-Fri 8am-5pm):  [phone] On Call:  [phone] & follow prompts after 5pm & weekends Office Phone:  [phone] Office Fax:  [phone]

## 2017-04-15 LAB — COMPLETE METABOLIC PANEL WITH GFR
ALT: 11 U/L (ref 6–29)
AST: 18 U/L (ref 10–35)
Albumin: 3.9 g/dL (ref 3.6–5.1)
Alkaline Phosphatase: 53 U/L (ref 33–130)
BUN: 20 mg/dL (ref 7–25)
CO2: 26 mmol/L (ref 20–31)
Calcium: 9.3 mg/dL (ref 8.6–10.4)
Chloride: 96 mmol/L — ABNORMAL LOW (ref 98–110)
Creat: 0.85 mg/dL (ref 0.60–0.88)
GFR, Est African American: 71 mL/min (ref >=60)
GFR, Est Non African American: 61 mL/min (ref >=60)
Glucose, Bld: 89 mg/dL (ref 65–99)
Potassium: 4.9 mmol/L (ref 3.5–5.3)
Sodium: 131 mmol/L — ABNORMAL LOW (ref 135–146)
Total Bilirubin: 0.6 mg/dL (ref 0.2–1.2)
Total Protein: 6.5 g/dL (ref 6.1–8.1)

## 2017-04-15 LAB — TSH: TSH: 1.1 mIU/L

## 2017-04-17 ENCOUNTER — Encounter (HOSPITAL_COMMUNITY): Payer: Self-pay | Admitting: Emergency Medicine

## 2017-04-17 ENCOUNTER — Emergency Department (HOSPITAL_COMMUNITY)
Admission: EM | Admit: 2017-04-17 | Discharge: 2017-04-17 | Disposition: A | Discharge status: Home / Self Care | Payer: Medicare Other | Attending: Emergency Medicine | Admitting: Emergency Medicine

## 2017-04-17 DIAGNOSIS — Y929 Unspecified place or not applicable: Secondary | ICD-10-CM | POA: Insufficient documentation

## 2017-04-17 DIAGNOSIS — S0501XA Injury of conjunctiva and corneal abrasion without foreign body, right eye, initial encounter: Secondary | ICD-10-CM | POA: Insufficient documentation

## 2017-04-17 DIAGNOSIS — Z91040 Latex allergy status: Secondary | ICD-10-CM | POA: Diagnosis not present

## 2017-04-17 DIAGNOSIS — Z79899 Other long term (current) drug therapy: Secondary | ICD-10-CM | POA: Diagnosis not present

## 2017-04-17 DIAGNOSIS — S0591XA Unspecified injury of right eye and orbit, initial encounter: Secondary | ICD-10-CM | POA: Diagnosis present

## 2017-04-17 DIAGNOSIS — X58XXXA Exposure to other specified factors, initial encounter: Secondary | ICD-10-CM | POA: Insufficient documentation

## 2017-04-17 DIAGNOSIS — Y999 Unspecified external cause status: Secondary | ICD-10-CM | POA: Diagnosis not present

## 2017-04-17 DIAGNOSIS — Z87891 Personal history of nicotine dependence: Secondary | ICD-10-CM | POA: Diagnosis not present

## 2017-04-17 DIAGNOSIS — I10 Essential (primary) hypertension: Secondary | ICD-10-CM | POA: Insufficient documentation

## 2017-04-17 DIAGNOSIS — Y939 Activity, unspecified: Secondary | ICD-10-CM | POA: Diagnosis not present

## 2017-04-17 DIAGNOSIS — Z7901 Long term (current) use of anticoagulants: Secondary | ICD-10-CM | POA: Insufficient documentation

## 2017-04-17 MED ORDER — POLYMYXIN B-TRIMETHOPRIM 10000-0.1 UNIT/ML-% OP SOLN: 1.0000 [drp] | OPHTHALMIC | 0 refills | Status: DC

## 2017-04-17 MED ORDER — TETRACAINE HCL 0.5 % OP SOLN
2.0000 [drp] | Freq: Once | OPHTHALMIC | Status: AC
  Administered 2017-04-17: 2 [drp] via OPHTHALMIC
  Filled 2017-04-17: qty 4

## 2017-04-17 MED ORDER — FLUORESCEIN SODIUM 0.6 MG OP STRP
1.0000 | ORAL_STRIP | Freq: Once | OPHTHALMIC | Status: AC
  Administered 2017-04-17: 1 via OPHTHALMIC
  Filled 2017-04-17: qty 1

## 2017-04-17 NOTE — ED Triage Notes (Signed)
Pain in r/eye x 8 hour. Pt stated that it"feels like something is in there". Seen by RN at [facility] and referred to ED. Denies falls, denies blurred vision

## 2017-04-17 NOTE — Discharge Instructions (Signed)
Take tylenol up to 1000mg (2 extra strength) four times a day.

## 2017-04-17 NOTE — ED Provider Notes (Signed)
[facility] DEPT Provider Note   CSN: [id number] Arrival date & time: 04/17/17  1038     History   Chief Complaint Chief Complaint  Patient presents with  . Eye Problem    HPI Cathy Dickerson is a 83 y.o. female.  83 yo F with a chief complaint of right eye pain. Patient woke up this morning went to the bathroom and then thought that something is stuck in her right eye. She denies contact lens use. Denies drainage. The patient denies any injury. Level 5 caveat dementia.   The history is provided by the patient.  Eye Problem   This is a new problem. The current episode started 2 days ago. The problem occurs constantly. The problem has not changed since onset.There is a problem in the right eye. There was no injury mechanism. The pain is at a severity of 6/10. The pain is moderate. There is no history of trauma to the eye. There is no known exposure to pink eye. Pertinent negatives include no eye redness, no nausea and no vomiting. She has tried nothing for the symptoms. The treatment provided no relief.    Past Medical History:  Diagnosis Date  . Actinic keratoses   . Allergy   . Alzheimer disease 05/21/2015  . Anxiety   . Arrhythmia    atrial fib  . Atrial fibrillation ([place]) 12/2012  . Basal cell carcinoma of skin   . Cataracts, both eyes 2005, 2013   Cataract extraction/IOL implant  . Current use of long term anticoagulation 12/2012   Eliquis  . Depression   . Diverticulosis 2008  . Fracture of scaphoid bone of hand 08/05/2004  . History of UTI   . Hyperlipidemia   . Hypertension   . Hyponatremia    SIADH  . Low sodium levels   . Mild cognitive impairment   . Osteopenia   . Rosacea   . Squamous cell carcinoma   . Thyroid nodule     Patient Active Problem List   Diagnosis Date Noted  . Urge incontinence of urine 03/08/2017  . History of recurrent UTIs 12/23/2016  . Pain in both lower extremities 12/23/2016  . Hyponatremia 12/23/2016  . Visual  hallucinations 12/23/2016  . Late onset Alzheimer's disease with behavioral disturbance 12/23/2016  . 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 Depression 05/21/2015  . Diverticulosis 12/10/2014  . Multiple thyroid nodules 12/10/2014  . Osteopenia 12/10/2014  . Generalized weakness 09/06/2014  . UTI (lower urinary tract infection) 09/06/2014  . Hypertension   . Mild cognitive impairment   . Atrial fibrillation ([place]) 12/22/2012  . Current use of long term anticoagulation 12/22/2012    Past Surgical History:  Procedure Laterality Date  . ABDOMINAL HYSTERECTOMY  1970   TAH-BSO  . cataracts Bilateral 2/05 and 3/05  . CESAREAN SECTION     questionable    OB History    No data available       Home Medications    Prior to Admission medications   Medication Sig Start Date End Date Taking? Authorizing Provider  apixaban (ELIQUIS) 2.5 MG TABS tablet Take 1 tablet (2.5 mg total) by mouth 2 (two) times daily. 01/04/13  Yes Ripley Fraise, MD  cholecalciferol (VITAMIN D) 1000 UNITS tablet Take 2,000 Units by mouth daily.   Yes [provider]  metoprolol succinate (TOPROL-XL) 25 MG 24 hr tablet Take 12.5 mg by mouth daily.   Yes [provider]  Probiotic Product (PROBIOTIC PO) Take 1 capsule by mouth every  morning.   Yes [provider]  QUEtiapine (SEROQUEL) 50 MG tablet Take 50 mg by mouth daily. 04/05/17  Yes [provider]  sodium chloride 1 G tablet Take 1 g by mouth 2 (two) times daily with a meal.   Yes [provider]  trimethoprim (TRIMPEX) 100 MG tablet Take 100 mg by mouth daily.   Yes [provider]  trimethoprim-polymyxin b (POLYTRIM) ophthalmic solution Place 1 drop into the right eye every 4 (four) hours. 04/17/17   Deno Etienne, DO    Family History Family History  Problem Relation Age of Onset  . Breast cancer Mother   . Crohn's disease Mother   . Stroke Father 66  . Alzheimer's disease Sister   . Colon cancer Sister   .  Colon polyps Sister   . Breast cancer Daughter        x 2  . Graves' disease Daughter   . Alcohol abuse Son   . Stroke Paternal Grandfather   . Graves' disease Daughter   . Breast cancer Daughter   . Breast cancer Daughter   . Kidney disease Neg Hx   . Liver disease Neg Hx   . Esophageal cancer Neg Hx   . Pancreatic cancer Neg Hx     Social History Social History  Substance Use Topics  . Smoking status: Former Research scientist (life sciences)  . Smokeless tobacco: Never Used  . Alcohol use 0.0 oz/week     Comment: 1 drink daily     Allergies   Solifenacin; Ciprofloxacin; Latex; Macrobid [nitrofurantoin macrocrystal]; Macrodantin [nitrofurantoin]; Other; Strawberry extract; and Tramadol   Review of Systems Review of Systems  Constitutional: Negative for chills and fever.  HENT: Positive for ear discharge and ear pain. Negative for congestion and rhinorrhea.   Eyes: Negative for redness and visual disturbance.  Respiratory: Negative for shortness of breath and wheezing.   Cardiovascular: Negative for chest pain and palpitations.  Gastrointestinal: Negative for nausea and vomiting.  Genitourinary: Negative for dysuria and urgency.  Musculoskeletal: Negative for arthralgias and myalgias.  Skin: Negative for pallor and wound.  Neurological: Negative for dizziness and headaches.     Physical Exam Updated Vital Signs BP (!) 173/67 (BP Location: Left Arm)   Pulse (!) 52   Temp 97.5 F (36.4 C) (Oral)   Resp 15   Wt 53.1 kg (117 lb)   SpO2 100%   BMI 20.73 kg/m   Physical Exam  Constitutional: She is oriented to person, place, and time. She appears well-developed and well-nourished. No distress.  HENT:  Head: Normocephalic and atraumatic.  Eyes: EOM are normal. Pupils are equal, round, and reactive to light.  Slit lamp exam:      The right eye shows corneal abrasion and fluorescein uptake.    Neck: Normal range of motion. Neck supple.  Cardiovascular: Normal rate and regular rhythm.   Exam reveals no gallop and no friction rub.   No murmur heard. Pulmonary/Chest: Effort normal. She has no wheezes. She has no rales.  Abdominal: Soft. She exhibits no distension and no mass. There is no tenderness. There is no guarding.  Musculoskeletal: She exhibits no edema or tenderness.  Neurological: She is alert and oriented to person, place, and time.  Skin: Skin is warm and dry. She is not diaphoretic.  Psychiatric: She has a normal mood and affect. Her behavior is normal.  Nursing note and vitals reviewed.    ED Treatments / Results  Labs (all labs ordered are listed, but only  abnormal results are displayed) Labs Reviewed - No data to display  EKG  EKG Interpretation None       Radiology No results found.  Procedures Procedures (including critical care time)  Medications Ordered in ED Medications  fluorescein ophthalmic strip 1 strip (1 strip Both Eyes Given by Other 04/17/17 1430)  tetracaine (PONTOCAINE) 0.5 % ophthalmic solution 2 drop (2 drops Both Eyes Given by Other 04/17/17 1431)     Initial Impression / Assessment and Plan / ED Course  I have reviewed the triage vital signs and the nursing notes.  Pertinent labs & imaging results that were available during my care of the patient were reviewed by me and considered in my medical decision making (see chart for details).     83 yo F With a chief complaint of right eye pain. Patient found to have corneal abrasion on exam. Discharge home.  3:58 PM:  I have discussed the diagnosis/risks/treatment options with the patient and family and believe the pt to be eligible for discharge home to follow-up with PCP. We also discussed returning to the ED immediately if new or worsening sx occur. We discussed the sx which are most concerning (e.g., sudden worsening pain, fever, inability to tolerate by mouth) that necessitate immediate return. Medications administered to the patient during their visit and any new  prescriptions provided to the patient are listed below.  Medications given during this visit Medications  fluorescein ophthalmic strip 1 strip (1 strip Both Eyes Given by Other 04/17/17 1430)  tetracaine (PONTOCAINE) 0.5 % ophthalmic solution 2 drop (2 drops Both Eyes Given by Other 04/17/17 1431)     The patient appears reasonably screen and/or stabilized for discharge and I doubt any other medical condition or other [facility] requiring further screening, evaluation, or treatment in the ED at this time prior to discharge.    Final Clinical Impressions(s) / ED Diagnoses   Final diagnoses:  Abrasion of right cornea, initial encounter    New Prescriptions Discharge Medication List as of 04/17/2017  2:21 PM    START taking these medications   Details  trimethoprim-polymyxin b (POLYTRIM) ophthalmic solution Place 1 drop into the right eye every 4 (four) hours., Starting Mon 04/17/2017, [place], Linna Hoff, DO 04/17/17 1558

## 2017-04-19 DIAGNOSIS — S0501XD Injury of conjunctiva and corneal abrasion without foreign body, right eye, subsequent encounter: Secondary | ICD-10-CM | POA: Diagnosis not present

## 2017-05-02 ENCOUNTER — Encounter: Payer: Self-pay | Admitting: Internal Medicine

## 2017-05-03 ENCOUNTER — Encounter: Payer: Self-pay | Admitting: Internal Medicine

## 2017-05-04 ENCOUNTER — Ambulatory Visit (INDEPENDENT_AMBULATORY_CARE_PROVIDER_SITE_OTHER): Payer: Medicare Other | Admitting: Internal Medicine

## 2017-05-04 ENCOUNTER — Encounter: Payer: Self-pay | Admitting: Internal Medicine

## 2017-05-04 VITALS — BP 118/70 | HR 48 | Temp 97.6°F | Ht 63.0 in | Wt 114.0 lb

## 2017-05-04 DIAGNOSIS — F321 Major depressive disorder, single episode, moderate: Secondary | ICD-10-CM | POA: Insufficient documentation

## 2017-05-04 DIAGNOSIS — R441 Visual hallucinations: Secondary | ICD-10-CM

## 2017-05-04 DIAGNOSIS — F0281 Dementia in other diseases classified elsewhere with behavioral disturbance: Secondary | ICD-10-CM | POA: Diagnosis not present

## 2017-05-04 DIAGNOSIS — F02818 Dementia in other diseases classified elsewhere, unspecified severity, with other behavioral disturbance: Secondary | ICD-10-CM

## 2017-05-04 DIAGNOSIS — G301 Alzheimer's disease with late onset: Secondary | ICD-10-CM | POA: Diagnosis not present

## 2017-05-04 MED ORDER — MIRTAZAPINE 7.5 MG PO TABS: 7.5000 mg | ORAL_TABLET | Freq: Every day | ORAL | 3 refills | Status: DC

## 2017-05-04 NOTE — Progress Notes (Signed)
Location:  [facility] clinic Provider: Sabria Florido L. Mariea Clonts, D.O., C.M.D.  Code Status: DNR Goals of Care:  Advanced Directives 04/17/2017  Does Patient Have a Medical Advance Directive? -  Type of Advance Directive [place]  Does patient want to make changes to medical advance directive? -  Copy of [place] in Chart? No - copy requested  Would patient like information on creating a medical advance directive? -   Chief Complaint  Patient presents with  . Acute Visit    Depression, patient has mentioned on several occasions that she would rather die. + depression screening (15). Here with Husband     HPI: Patient is a 83 y.o. female with advanced dementia either AD or possibly lewy body seen today for an acute visit for depression.  PHQ 9 of 15 today.  She has been mentioning she'd rather die.  Her intake is not as good lately per her husband.  She does have an appetite.  Smaller portions fill her up faster.  She's taking her medications better.  She's not taking anything for her mood or dementia.   They have not gone to get the shingles vaccines yet, but Trip will look for them.    She is more and more down and admits to this.  She has some insight into her dementia off and on.  She is bothered by halllucinations.  Recently, Trip has not noticed any nightmares.  Pt does not know either way.    There has been discussion about memory care and it is recommended at this point, but her husband is having difficulty coping with this idea.  There are plans for a beach trip with her family that she does not want to go to, but he is encouraging her to go to see her family.  The last time, this did not go well at first, but apparently she calmed down after they were there a bit.  They do have their own home to stay in away from everyone.  We have recommended a respite stay for her in memory care also, but Trip has not gone for this just yet either.  They do have regular  caregivers to assist her with her am routine.   Past Medical History:  Diagnosis Date  . Actinic keratoses   . Allergy   . Alzheimer disease 05/21/2015  . Anxiety   . Arrhythmia    atrial fib  . Atrial fibrillation ([place]) 12/2012  . Basal cell carcinoma of skin   . Cataracts, both eyes 2005, 2013   Cataract extraction/IOL implant  . Current use of long term anticoagulation 12/2012   Eliquis  . Depression   . Diverticulosis 2008  . Fracture of scaphoid bone of hand 08/05/2004  . History of UTI   . Hyperlipidemia   . Hypertension   . Hyponatremia    SIADH  . Low sodium levels   . Mild cognitive impairment   . Osteopenia   . Rosacea   . Squamous cell carcinoma   . Thyroid nodule     Past Surgical History:  Procedure Laterality Date  . ABDOMINAL HYSTERECTOMY  1970   TAH-BSO  . cataracts Bilateral 2/05 and 3/05  . CESAREAN SECTION     questionable    Allergies  Allergen Reactions  . Solifenacin Other (See Comments)  . Ciprofloxacin Other (See Comments)    Reaction unknown  . Latex Other (See Comments)    Reaction unknown  . Macrobid [employer]  Macrocrystal] Nausea And Vomiting  . Macrodantin [Nitrofurantoin] Nausea Only    Upset stomach  . Other Other (See Comments)    Popcorn- Diverticulosis  . Strawberry Extract Other (See Comments)    Diverticulosis   . Tramadol Nausea Only    Allergies as of 05/04/2017      Reactions   Solifenacin Other (See Comments)   Ciprofloxacin Other (See Comments)   Reaction unknown   Latex Other (See Comments)   Reaction unknown   Macrobid [nitrofurantoin Macrocrystal] Nausea And Vomiting   Macrodantin [nitrofurantoin] Nausea Only   Upset stomach   Other Other (See Comments)   Popcorn- Diverticulosis   Strawberry Extract Other (See Comments)   Diverticulosis    Tramadol Nausea Only      Medication List       Accurate as of 05/04/17 12:12 PM. Always use your most recent med list.          apixaban 2.5 MG Tabs  tablet Commonly known as:  ELIQUIS Take 1 tablet (2.5 mg total) by mouth 2 (two) times daily.   CALCIUM 600+D PO Take by mouth daily.   cholecalciferol 1000 units tablet Commonly known as:  VITAMIN D Take 2,000 Units by mouth daily.   metoprolol succinate 25 MG 24 hr tablet Commonly known as:  TOPROL-XL Take 12.5 mg by mouth daily.   multivitamin tablet Take 1 tablet by mouth daily.   PROBIOTIC PO Take 1 capsule by mouth every morning.   sodium chloride 1 g tablet Take 1 g by mouth 2 (two) times daily with a meal.   trimethoprim 100 MG tablet Commonly known as:  TRIMPEX Take 100 mg by mouth daily.       Review of Systems:  Review of Systems  Constitutional: Positive for weight loss. Negative for chills, fever and malaise/fatigue.  HENT: Negative for congestion.   Eyes:       Glasses, visual agnosia  Respiratory: Negative for cough and shortness of breath.   Cardiovascular: Negative for chest pain and leg swelling.  Gastrointestinal: Negative for abdominal pain, blood in stool, constipation, melena, nausea and vomiting.  Genitourinary:       Urinary incontinence--poor hygiene with changing pads--only changing with caregiver in am  Musculoskeletal: Negative for falls.       Unsteady gait, tends to hold onto her husband  Skin: Negative for itching and rash.  Neurological: Negative for dizziness, loss of consciousness and weakness.  Psychiatric/Behavioral: Positive for depression, hallucinations, memory loss and suicidal ideas. Negative for substance abuse. The patient is not nervous/anxious and does not have insomnia.        Speaks of not wanting to be here anymore, but has no plans for her suicide    Health Maintenance  Topic Date Due  . DEXA SCAN  11/21/2017 (Originally 04/05/1994)  . INFLUENZA VACCINE  06/21/2017  . TETANUS/TDAP  01/05/2026  . PNA vac Low Risk Adult  Completed    Physical Exam: Vitals:   05/04/17 1150  BP: 118/70  Pulse: (!) 48  Temp:  97.6 F (36.4 C)  TempSrc: Oral  SpO2: 98%  Weight: 114 lb (51.7 kg)  Height: 5\' 3"  (1.6 m)   Body mass index is 20.19 kg/m. Physical Exam  Constitutional: No distress.  Frail white female  Cardiovascular: Normal rate, regular rhythm, normal heart sounds and intact distal pulses.   Pulmonary/Chest: Effort normal and breath sounds normal. No respiratory distress.  Abdominal: Bowel sounds are normal.  Musculoskeletal: Normal range of motion.  Unsteady gait  Neurological: She is alert.  Skin: Skin is warm and dry.  Psychiatric:  Flat affect today, dark circles under eyes--appears fatigued and down    Labs reviewed: Basic Metabolic Panel:  Recent Labs  05/09/16 04/14/17 1224  NA 132* 131*  K 5.4* 4.9  CL  --  96*  CO2  --  26  GLUCOSE  --  89  BUN 24* 20  CREATININE 0.9 0.85  CALCIUM  --  9.3  TSH  --  1.10   Liver Function Tests:  Recent Labs  04/14/17 1224  AST 18  ALT 11  ALKPHOS 53  BILITOT 0.6  PROT 6.5  ALBUMIN 3.9   No results for input(s): LIPASE, AMYLASE in the last 8760 hours. No results for input(s): AMMONIA in the last 8760 hours. CBC:  Recent Labs  05/09/16 04/14/17 1224  WBC 6.0 6.6  NEUTROABS  --  4,224  HGB 13.4 13.8  HCT 40 38.1  MCV  --  93.6  PLT 201 205   Assessment/Plan 1. Depression, major, single episode, moderate ([place]) -getting worse off seroquel it seems, but po intake also worse and not clear if she's sleeping well or not at hs -will start on low dose remeron to help all of these things and see how it goes - did not do well with lexapro or seroquel so far - mirtazapine (REMERON) 7.5 MG tablet; Take 1 tablet (7.5 mg total) by mouth at bedtime.  Dispense: 30 tablet; Refill: 3  2. Late onset Alzheimer's disease with behavioral disturbance -? Of any lewy body disease with gait instability, incontinence and vivid hallucinations, but may simply be a stage of her AD also -cont caregiver support -recommend memory care unit at  [facility] -perhaps could transition or do respite stay when her husband has a vacation with his family?  3. Visual hallucinations -ongoing, bothersome to patient, but seroquel not helpful -without her being in a place where she can be closely monitored, I'm hesitant to try other antipsychotics -will treat her depression and see if it helps any with the psychosis (not entirely clear if the psychosis is due to her dementia or depression at this point)  Labs/tests ordered:  No orders of the defined types were placed in this encounter.  Next appt: f/u in clinic upon return from beach trip, prn or possibly in memory care if they decide to make the move   Briele Lagasse L. Myelle Poteat, D.O. [facility] [address], [place] [address] Cell Phone (Mon-Fri 8am-5pm):  [phone] On Call:  [phone] & follow prompts after 5pm & weekends Office Phone:  [phone] Office Fax:  [phone]

## 2017-05-09 DIAGNOSIS — N3001 Acute cystitis with hematuria: Secondary | ICD-10-CM | POA: Diagnosis not present

## 2017-05-09 DIAGNOSIS — R300 Dysuria: Secondary | ICD-10-CM | POA: Diagnosis not present

## 2017-05-22 DIAGNOSIS — I10 Essential (primary) hypertension: Secondary | ICD-10-CM | POA: Diagnosis not present

## 2017-05-22 DIAGNOSIS — L900 Lichen sclerosus et atrophicus: Secondary | ICD-10-CM | POA: Diagnosis not present

## 2017-05-22 DIAGNOSIS — D649 Anemia, unspecified: Secondary | ICD-10-CM | POA: Diagnosis not present

## 2017-06-01 IMAGING — DX DG CHEST 2V
2 series · 2 of 2 positions shown · non-contrast
Comparison: 08/22/2015

CLINICAL DATA: Dizziness and shortness of Breath

EXAM:
CHEST - 2 VIEW

[chest lat]
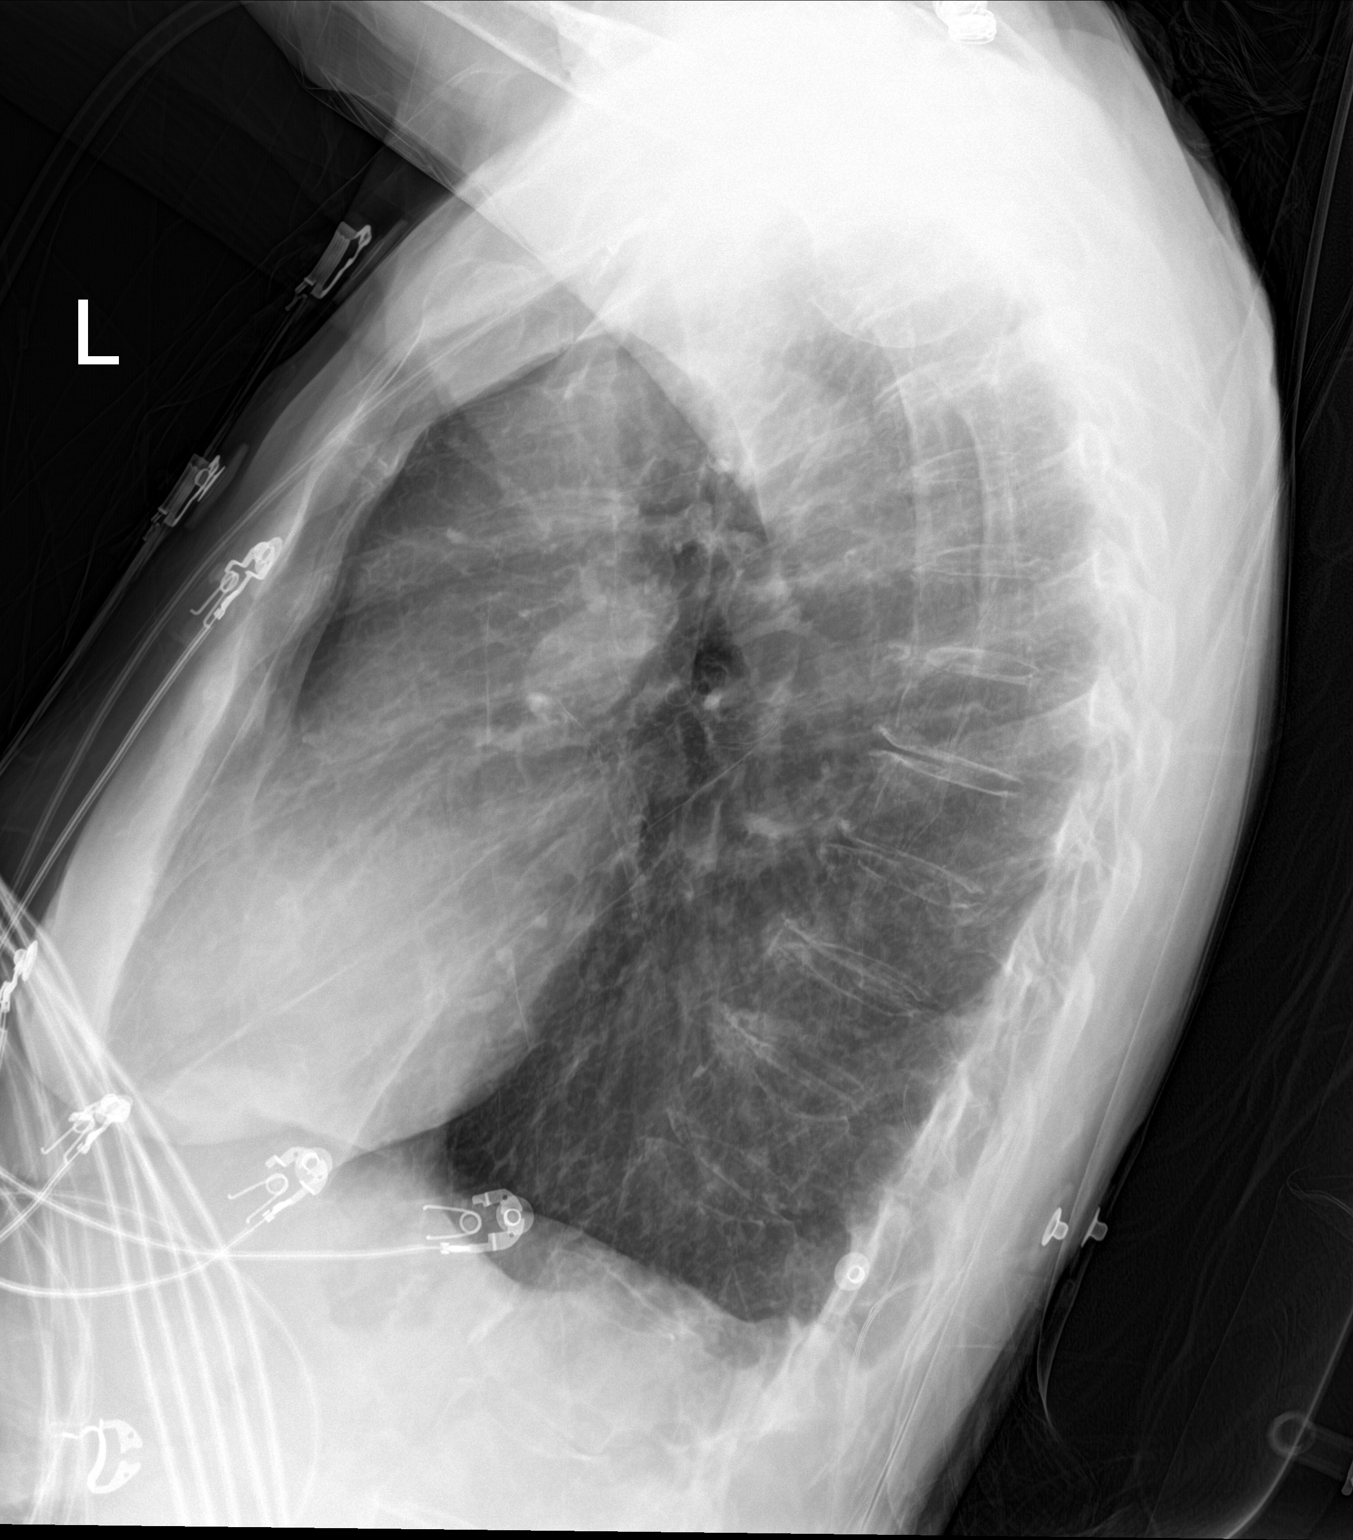

[chest ap]
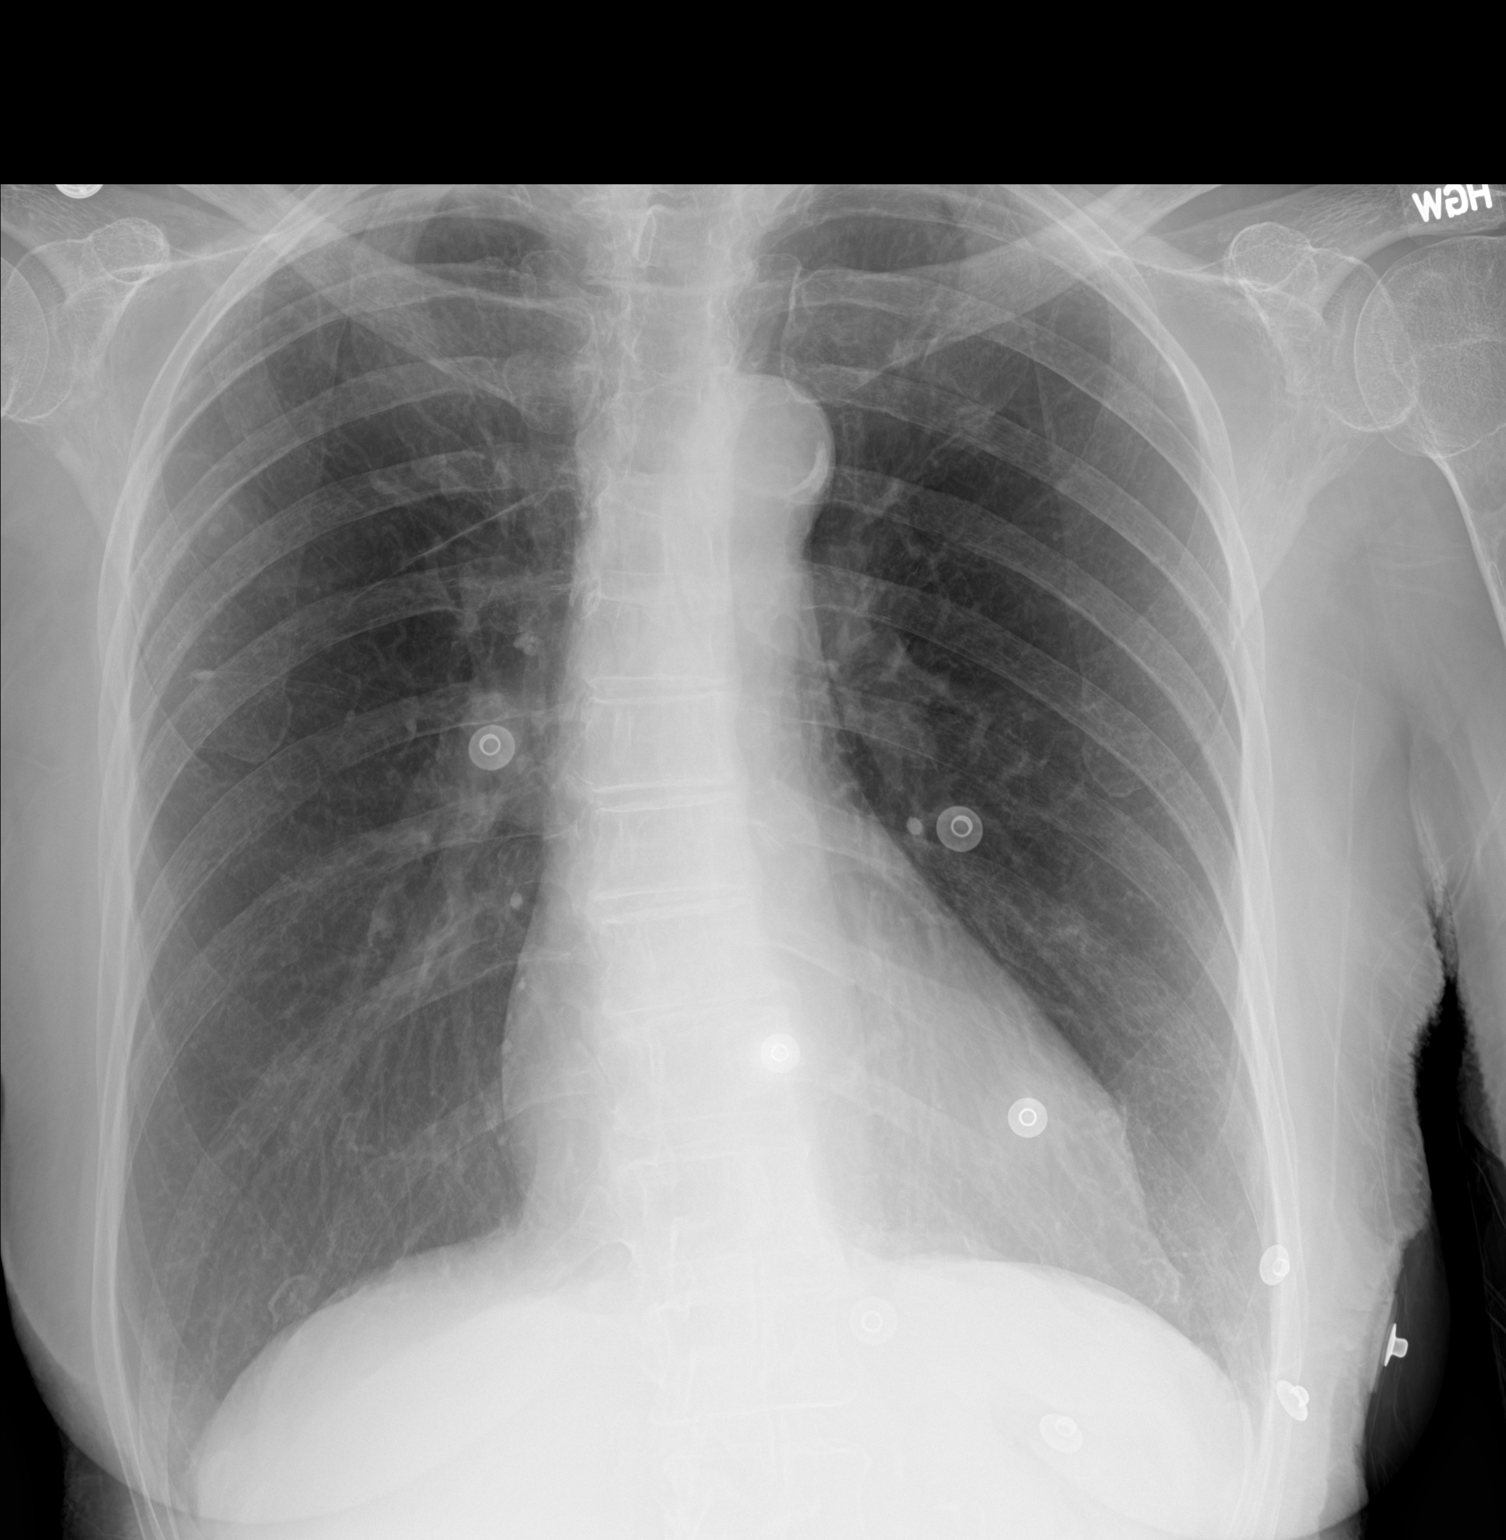

[2 of 2 positions shown; findings below may reference images not displayed]

FINDINGS: Cardiac shadow is stable. The lungs are mildly hyperinflated.
Calcified granulomas are again noted on the right. The bony
structures are within normal limits.
IMPRESSION: No acute abnormality noted.

## 2017-06-19 ENCOUNTER — Other Ambulatory Visit: Payer: Self-pay | Admitting: Internal Medicine

## 2017-06-19 DIAGNOSIS — Z1231 Encounter for screening mammogram for malignant neoplasm of breast: Secondary | ICD-10-CM

## 2017-06-21 ENCOUNTER — Encounter: Payer: Self-pay | Admitting: Internal Medicine

## 2017-06-21 ENCOUNTER — Non-Acute Institutional Stay: Payer: Medicare Other

## 2017-06-21 VITALS — BP 125/60 | HR 53 | Temp 97.5°F | Ht 63.0 in | Wt 117.0 lb

## 2017-06-21 DIAGNOSIS — Z0000 Encounter for general adult medical examination without abnormal findings: Secondary | ICD-10-CM | POA: Diagnosis not present

## 2017-06-21 MED ORDER — ZOSTER VAC RECOMB ADJUVANTED 50 MCG/0.5ML IM SUSR
0.5000 mL | Freq: Once | INTRAMUSCULAR | 1 refills | Status: AC
Start: 2017-06-21 — End: 2017-06-21

## 2017-06-21 NOTE — Progress Notes (Signed)
Subjective:   Cathy Dickerson is a 83 y.o. female who presents for an Initial Medicare Annual Wellness Visit at [facility]- independent living pt       Objective:    Today's Vitals   06/21/17 1112  BP: 125/60  Pulse: (!) 53  Temp: (!) 97.5 F (36.4 C)  TempSrc: Oral  SpO2: 97%  Weight: 117 lb (53.1 kg)  Height: 5\' 3"  (1.6 m)   Body mass index is 20.73 kg/m.   Current Medications (verified) Outpatient Encounter Prescriptions as of 06/21/2017  Medication Sig  . apixaban (ELIQUIS) 2.5 MG TABS tablet Take 1 tablet (2.5 mg total) by mouth 2 (two) times daily.  . Calcium Carbonate-Vitamin D (CALCIUM 600+D PO) Take by mouth daily.  . cholecalciferol (VITAMIN D) 1000 UNITS tablet Take 2,000 Units by mouth daily.  . metoprolol succinate (TOPROL-XL) 25 MG 24 hr tablet Take 12.5 mg by mouth daily.  . mirtazapine (REMERON) 7.5 MG tablet Take 1 tablet (7.5 mg total) by mouth at bedtime.  . Multiple Vitamin (MULTIVITAMIN) tablet Take 1 tablet by mouth daily.  . Probiotic Product (PROBIOTIC PO) Take 1 capsule by mouth every morning.  . sodium chloride 1 G tablet Take 1 g by mouth 2 (two) times daily with a meal.  . trimethoprim (TRIMPEX) 100 MG tablet Take 100 mg by mouth daily.   No facility-administered encounter medications on file as of 06/21/2017.     Allergies (verified) Solifenacin; Ciprofloxacin; Latex; Macrobid [nitrofurantoin macrocrystal]; Macrodantin [nitrofurantoin]; Other; Strawberry extract; and Tramadol   History: Past Medical History:  Diagnosis Date  . Actinic keratoses   . Allergy   . Alzheimer disease 05/21/2015  . Anxiety   . Arrhythmia    atrial fib  . Atrial fibrillation ([place]) 12/2012  . Basal cell carcinoma of skin   . Cataracts, both eyes 2005, 2013   Cataract extraction/IOL implant  . Current use of long term anticoagulation 12/2012   Eliquis  . Depression   . Diverticulosis 2008  . Fracture of scaphoid bone of hand 08/05/2004  . History of UTI    . Hyperlipidemia   . Hypertension   . Hyponatremia    SIADH  . Low sodium levels   . Mild cognitive impairment   . Osteopenia   . Rosacea   . Squamous cell carcinoma   . Thyroid nodule    Past Surgical History:  Procedure Laterality Date  . ABDOMINAL HYSTERECTOMY  1970   TAH-BSO  . cataracts Bilateral 2/05 and 3/05  . CESAREAN SECTION     questionable   Family History  Problem Relation Age of Onset  . Breast cancer Mother   . Crohn's disease Mother   . Stroke Father 6  . Alzheimer's disease Sister   . Colon cancer Sister   . Colon polyps Sister   . Breast cancer Daughter        x 2  . Graves' disease Daughter   . Alcohol abuse Son   . Stroke Paternal Grandfather   . Graves' disease Daughter   . Breast cancer Daughter   . Breast cancer Daughter   . Kidney disease Neg Hx   . Liver disease Neg Hx   . Esophageal cancer Neg Hx   . Pancreatic cancer Neg Hx    Social History   Occupational History  . Retired    Social History Main Topics  . Smoking status: Former Research scientist (life sciences)  . Smokeless tobacco: Never Used  . Alcohol use 2.4 - 3.0  oz/week    4 - 5 Glasses of wine per week  . Drug use: No  . Sexual activity: Not on file    Tobacco Counseling Counseling given: Not Answered   Activities of Daily Living In your present state of health, do you have any difficulty performing the following activities: 06/21/2017  Hearing? N  Vision? N  Difficulty concentrating or making decisions? Y  Walking or climbing stairs? Y  Dressing or bathing? Y  Doing errands, shopping? Y  Preparing Food and eating ? Y  Using the Toilet? Y  In the past six months, have you accidently leaked urine? N  Do you have problems with loss of bowel control? N  Managing your Medications? Y  Managing your Finances? Y  Housekeeping or managing your Housekeeping? Y  Some recent data might be hidden    Immunizations and Health Maintenance Immunization History  Administered Date(s) Administered   . H1N1 12/04/2008  . Influenza-Unspecified 09/15/2003, 11/01/2010, 09/12/2011, 07/22/2016  . Pneumococcal Conjugate-13 04/14/2017  . Pneumococcal Polysaccharide-23 12/19/2008  . Td 09/15/2003, 01/06/2016   Health Maintenance Due  Topic Date Due  . INFLUENZA VACCINE  06/21/2017    Patient Care Team: [name], DO as PCP - General (Geriatric Medicine) [name], MD as Consulting Physician (Cardiology) [facility] [name], MD as Consulting Physician (Gastroenterology) [name], [name], MD as Consulting Physician (Dermatology) [name], MD as Consulting Physician (Cardiology) [name], MD as Consulting Physician (Ophthalmology) Zimmer, [name], NP as Nurse Practitioner (Internal Medicine) [name], MD as Consulting Physician (Gynecology)  Indicate any recent Medical Services you may have received from other than [facility] providers in the past year (date may be approximate).     Assessment:   This is a routine wellness examination for [place].   Hearing/Vision screen No exam data present  Dietary issues and exercise activities discussed: Current Exercise Habits: The patient does not participate in regular exercise at present, Exercise limited by: neurologic condition(s)  Goals    None     Depression Screen PHQ 2/9 Scores 06/21/2017 05/04/2017 03/08/2017 12/21/2016  PHQ - 2 Score 0 6 0 0  PHQ- 9 Score - 16 - -    Fall Risk Fall Risk  06/21/2017 05/04/2017 03/08/2017 02/02/2017 12/21/2016  Falls in the past year? No No No No No    Cognitive Function: MMSE - Mini Mental State Exam 06/21/2017  Orientation to time 0  Orientation to Place 0  Registration 3  Attention/ Calculation 4  Recall 0  Language- name 2 objects 2  Language- repeat 1  Language- follow 3 step command 2  Language- read & follow direction 0  Write a sentence 0  Copy design 0  Total score 12        Screening Tests Health Maintenance    Topic Date Due  . INFLUENZA VACCINE  06/21/2017  . TETANUS/TDAP  01/05/2026  . DEXA SCAN  Completed  . PNA vac Low Risk Adult  Completed      Plan:  I have personally reviewed and addressed the Medicare Annual Wellness questionnaire and have noted the following in the patient's chart:  A. Medical and social history B. Use of alcohol, tobacco or illicit drugs  C. Current medications and supplements D. Functional ability and status E.  Nutritional status F.  Physical activity G. Advance directives H. List of other physicians I.  Hospitalizations, surgeries, and ER visits in previous 12 months J.  Vitals K. Screenings to  include hearing, vision, cognitive, depression L. Referrals and appointments - none  In addition, I have reviewed and discussed with patient certain preventive protocols, quality metrics, and best practice recommendations. A written personalized care plan for preventive services as well as general preventive health recommendations were provided to patient.  See attached scanned questionnaire for additional information.   Signed,   Rich Reining, RN Nurse Health Advisor   Quick Notes   Health Maintenance: Shingles vaccine prescription sent to pharmacy     Abnormal Screen: MMSE 12/30. Did not pass clock drawing     Patient Concerns: Painful bump on right side of temple. Planning on seeing dermatologist      Nurse Concerns: Progressing alzheimers

## 2017-06-21 NOTE — Patient Instructions (Addendum)
[name] , Thank you for taking time to come for your Medicare Wellness Visit. I appreciate your ongoing commitment to your health goals. Please review the following plan we discussed and let me know if I can assist you in the future.   Screening recommendations/referrals: Colonoscopy excluded, pt over age 81 Mammogram excluded, pt over age 78 Bone Density up to date Recommended yearly ophthalmology/optometry visit for glaucoma screening and checkup Recommended yearly dental visit for hygiene and checkup  Vaccinations: Influenza vaccine due 2018 fall season Pneumococcal vaccine up to date Tdap vaccine up to date. Due 01/05/26 Shingles vaccine due, prescription will be sent to your pharmacy  Advanced directives: In Chart  New conditions/risks identified: None  Next appointment: None upcoming scheduled   Preventive Care 65 Years and Older, Female Preventive care refers to lifestyle choices and visits with your health care provider that can promote health and wellness. What does preventive care include?  A yearly physical exam. This is also called an annual well check.  Dental exams once or twice a year.  Routine eye exams. Ask your health care provider how often you should have your eyes checked.  Personal lifestyle choices, including:  Daily care of your teeth and gums.  Regular physical activity.  Eating a healthy diet.  Avoiding tobacco and drug use.  Limiting alcohol use.  Practicing safe sex.  Taking low-dose aspirin every day.  Taking vitamin and mineral supplements as recommended by your health care provider. What happens during an annual well check? The services and screenings done by your health care provider during your annual well check will depend on your age, overall health, lifestyle risk factors, and family history of disease. Counseling  Your health care provider may ask you questions about your:  Alcohol use.  Tobacco use.  Drug  use.  Emotional well-being.  Home and relationship well-being.  Sexual activity.  Eating habits.  History of falls.  Memory and ability to understand (cognition).  Work and work Statistician.  Reproductive health. Screening  You may have the following tests or measurements:  Height, weight, and BMI.  Blood pressure.  Lipid and cholesterol levels. These may be checked every 5 years, or more frequently if you are over 36 years old.  Skin check.  Lung cancer screening. You may have this screening every year starting at age 23 if you have a 30-pack-year history of smoking and currently smoke or have quit within the past 15 years.  Fecal occult blood test (FOBT) of the stool. You may have this test every year starting at age 26.  Flexible sigmoidoscopy or colonoscopy. You may have a sigmoidoscopy every 5 years or a colonoscopy every 10 years starting at age 29.  Hepatitis C blood test.  Hepatitis B blood test.  Sexually transmitted disease (STD) testing.  Diabetes screening. This is done by checking your blood sugar (glucose) after you have not eaten for a while (fasting). You may have this done every 1-3 years.  Bone density scan. This is done to screen for osteoporosis. You may have this done starting at age 37.  Mammogram. This may be done every 1-2 years. Talk to your health care provider about how often you should have regular mammograms. Talk with your health care provider about your test results, treatment options, and if necessary, the need for more tests. Vaccines  Your health care provider may recommend certain vaccines, such as:  Influenza vaccine. This is recommended every year.  Tetanus, diphtheria, and acellular pertussis (Tdap,  Td) vaccine. You may need a Td booster every 10 years.  Zoster vaccine. You may need this after age 50.  Pneumococcal 13-valent conjugate (PCV13) vaccine. One dose is recommended after age 66.  Pneumococcal polysaccharide  (PPSV23) vaccine. One dose is recommended after age 37. Talk to your health care provider about which screenings and vaccines you need and how often you need them. This information is not intended to replace advice given to you by your health care provider. Make sure you discuss any questions you have with your health care provider. Document Released: 12/04/2015 Document Revised: 07/27/2016 Document Reviewed: 09/08/2015 Elsevier Interactive Patient Education  2017 [place] Prevention in the Home Falls can cause injuries. They can happen to people of all ages. There are many things you can do to make your home safe and to help prevent falls. What can I do on the outside of my home?  Regularly fix the edges of walkways and driveways and fix any cracks.  Remove anything that might make you trip as you walk through a door, such as a raised step or threshold.  Trim any bushes or trees on the path to your home.  Use bright outdoor lighting.  Clear any walking paths of anything that might make someone trip, such as rocks or tools.  Regularly check to see if handrails are loose or broken. Make sure that both sides of any steps have handrails.  Any raised decks and porches should have guardrails on the edges.  Have any leaves, snow, or ice cleared regularly.  Use sand or salt on walking paths during winter.  Clean up any spills in your garage right away. This includes oil or grease spills. What can I do in the bathroom?  Use night lights.  Install grab bars by the toilet and in the tub and shower. Do not use towel bars as grab bars.  Use non-skid mats or decals in the tub or shower.  If you need to sit down in the shower, use a plastic, non-slip stool.  Keep the floor dry. Clean up any water that spills on the floor as soon as it happens.  Remove soap buildup in the tub or shower regularly.  Attach bath mats securely with double-sided non-slip rug tape.  Do not have  throw rugs and other things on the floor that can make you trip. What can I do in the bedroom?  Use night lights.  Make sure that you have a light by your bed that is easy to reach.  Do not use any sheets or blankets that are too big for your bed. They should not hang down onto the floor.  Have a firm chair that has side arms. You can use this for support while you get dressed.  Do not have throw rugs and other things on the floor that can make you trip. What can I do in the kitchen?  Clean up any spills right away.  Avoid walking on wet floors.  Keep items that you use a lot in easy-to-reach places.  If you need to reach something above you, use a strong step stool that has a grab bar.  Keep electrical cords out of the way.  Do not use floor polish or wax that makes floors slippery. If you must use wax, use non-skid floor wax.  Do not have throw rugs and other things on the floor that can make you trip. What can I do with my stairs?  Do not  leave any items on the stairs.  Make sure that there are handrails on both sides of the stairs and use them. Fix handrails that are broken or loose. Make sure that handrails are as long as the stairways.  Check any carpeting to make sure that it is firmly attached to the stairs. Fix any carpet that is loose or worn.  Avoid having throw rugs at the top or bottom of the stairs. If you do have throw rugs, attach them to the floor with carpet tape.  Make sure that you have a light switch at the top of the stairs and the bottom of the stairs. If you do not have them, ask someone to add them for you. What else can I do to help prevent falls?  Wear shoes that:  Do not have high heels.  Have rubber bottoms.  Are comfortable and fit you well.  Are closed at the toe. Do not wear sandals.  If you use a stepladder:  Make sure that it is fully opened. Do not climb a closed stepladder.  Make sure that both sides of the stepladder are  locked into place.  Ask someone to hold it for you, if possible.  Clearly mark and make sure that you can see:  Any grab bars or handrails.  First and last steps.  Where the edge of each step is.  Use tools that help you move around (mobility aids) if they are needed. These include:  Canes.  Walkers.  Scooters.  Crutches.  Turn on the lights when you go into a dark area. Replace any light bulbs as soon as they burn out.  Set up your furniture so you have a clear path. Avoid moving your furniture around.  If any of your floors are uneven, fix them.  If there are any pets around you, be aware of where they are.  Review your medicines with your doctor. Some medicines can make you feel dizzy. This can increase your chance of falling. Ask your doctor what other things that you can do to help prevent falls. This information is not intended to replace advice given to you by your health care provider. Make sure you discuss any questions you have with your health care provider. Document Released: 09/03/2009 Document Revised: 04/14/2016 Document Reviewed: 12/12/2014 Elsevier Interactive Patient Education  2017 [employer].

## 2017-06-27 DIAGNOSIS — Z85828 Personal history of other malignant neoplasm of skin: Secondary | ICD-10-CM | POA: Diagnosis not present

## 2017-06-27 DIAGNOSIS — L821 Other seborrheic keratosis: Secondary | ICD-10-CM | POA: Diagnosis not present

## 2017-06-27 DIAGNOSIS — L570 Actinic keratosis: Secondary | ICD-10-CM | POA: Diagnosis not present

## 2017-07-03 ENCOUNTER — Encounter: Payer: Self-pay | Admitting: Internal Medicine

## 2017-07-04 DIAGNOSIS — I10 Essential (primary) hypertension: Secondary | ICD-10-CM | POA: Diagnosis not present

## 2017-07-04 DIAGNOSIS — I480 Paroxysmal atrial fibrillation: Secondary | ICD-10-CM | POA: Diagnosis not present

## 2017-07-04 DIAGNOSIS — I872 Venous insufficiency (chronic) (peripheral): Secondary | ICD-10-CM | POA: Diagnosis not present

## 2017-07-04 DIAGNOSIS — Z7901 Long term (current) use of anticoagulants: Secondary | ICD-10-CM | POA: Diagnosis not present

## 2017-07-08 ENCOUNTER — Encounter: Payer: Self-pay | Admitting: Internal Medicine

## 2017-07-08 DIAGNOSIS — R590 Localized enlarged lymph nodes: Secondary | ICD-10-CM | POA: Diagnosis not present

## 2017-07-08 DIAGNOSIS — I10 Essential (primary) hypertension: Secondary | ICD-10-CM | POA: Diagnosis not present

## 2017-07-10 ENCOUNTER — Encounter: Payer: Self-pay | Admitting: Internal Medicine

## 2017-07-12 ENCOUNTER — Encounter: Payer: Self-pay | Admitting: Internal Medicine

## 2017-07-12 ENCOUNTER — Non-Acute Institutional Stay: Payer: Medicare Other | Admitting: Internal Medicine

## 2017-07-12 VITALS — BP 138/70 | HR 62 | Temp 97.8°F | Wt 117.0 lb

## 2017-07-12 DIAGNOSIS — E042 Nontoxic multinodular goiter: Secondary | ICD-10-CM | POA: Diagnosis not present

## 2017-07-12 DIAGNOSIS — G301 Alzheimer's disease with late onset: Secondary | ICD-10-CM | POA: Diagnosis not present

## 2017-07-12 DIAGNOSIS — F0281 Dementia in other diseases classified elsewhere with behavioral disturbance: Secondary | ICD-10-CM | POA: Diagnosis not present

## 2017-07-12 DIAGNOSIS — R590 Localized enlarged lymph nodes: Secondary | ICD-10-CM | POA: Diagnosis not present

## 2017-07-12 DIAGNOSIS — F02818 Dementia in other diseases classified elsewhere, unspecified severity, with other behavioral disturbance: Secondary | ICD-10-CM

## 2017-07-12 NOTE — Progress Notes (Signed)
Location:  [facility]   Place of Service:  Clinic (12)  Provider: Teodor Prater L. Mariea Clonts, D.O., C.M.D.  Code Status: DNR Goals of Care:  Advanced Directives 06/21/2017  Does Patient Have a Medical Advance Directive? Yes  Type of Paramedic of [place];Living will  Does patient want to make changes to medical advance directive? No - Patient declined  Copy of [place] in Chart? Yes  Would patient like information on creating a medical advance directive? -     Chief Complaint  Patient presents with  . Acute Visit    swollen gland    HPI: Patient is a 83 y.o. female seen today for an acute visit for left neck swollen gland.  This has been swollen for one week.  A couple of weeks ago, her left ear was hurting, but that has since resolved.  She's had no cough, congestion, headache.  They already went to [facility] urgent [facility] and it was felt to be a 2 cm node.  She actually had a dental visit this am which was unremarkable here at [facility].  She does note pain in the area at times.  She's had prior thyroid nodules that were monitored for 3-4 years and felt to be benign after they did not change for that long per [place].    She is moving to [facility] later this week.  Past Medical History:  Diagnosis Date  . Actinic keratoses   . Allergy   . Alzheimer disease 05/21/2015  . Anxiety   . Arrhythmia    atrial fib  . Atrial fibrillation ([place]) 12/2012  . Basal cell carcinoma of skin   . Cataracts, both eyes 2005, 2013   Cataract extraction/IOL implant  . Current use of long term anticoagulation 12/2012   Eliquis  . Depression   . Diverticulosis 2008  . Fracture of scaphoid bone of hand 08/05/2004  . History of UTI   . Hyperlipidemia   . Hypertension   . Hyponatremia    SIADH  . Low sodium levels   . Mild cognitive impairment   . Osteopenia   . Rosacea   . Squamous cell carcinoma   . Thyroid nodule     Past Surgical History:  Procedure  Laterality Date  . ABDOMINAL HYSTERECTOMY  1970   TAH-BSO  . cataracts Bilateral 2/05 and 3/05  . CESAREAN SECTION     questionable    Allergies  Allergen Reactions  . Solifenacin Other (See Comments)  . Ciprofloxacin Other (See Comments)    Reaction unknown  . Latex Other (See Comments)    Reaction unknown  . Macrobid [Nitrofurantoin Macrocrystal] Nausea And Vomiting  . Macrodantin [Nitrofurantoin] Nausea Only    Upset stomach  . Other Other (See Comments)    Popcorn- Diverticulosis  . Strawberry Extract Other (See Comments)    Diverticulosis   . Tramadol Nausea Only    Allergies as of 07/12/2017      Reactions   Solifenacin Other (See Comments)   Ciprofloxacin Other (See Comments)   Reaction unknown   Latex Other (See Comments)   Reaction unknown   Macrobid [nitrofurantoin Macrocrystal] Nausea And Vomiting   Macrodantin [nitrofurantoin] Nausea Only   Upset stomach   Other Other (See Comments)   Popcorn- Diverticulosis   Strawberry Extract Other (See Comments)   Diverticulosis    Tramadol Nausea Only      Medication List       Accurate as of 07/12/17  2:14 PM.  Always use your most recent med list.          amoxicillin-clavulanate 875-125 MG tablet Commonly known as:  AUGMENTIN Take 1 tablet by mouth 2 (two) times daily.   apixaban 2.5 MG Tabs tablet Commonly known as:  ELIQUIS Take 1 tablet (2.5 mg total) by mouth 2 (two) times daily.   CALCIUM 600+D PO Take by mouth daily.   cholecalciferol 1000 units tablet Commonly known as:  VITAMIN D Take 2,000 Units by mouth daily.   metoprolol succinate 25 MG 24 hr tablet Commonly known as:  TOPROL-XL Take 12.5 mg by mouth daily.   mirtazapine 7.5 MG tablet Commonly known as:  REMERON Take 1 tablet (7.5 mg total) by mouth at bedtime.   multivitamin tablet Take 1 tablet by mouth daily.   PROBIOTIC PO Take 1 capsule by mouth every morning.   sodium chloride 1 g tablet Take 1 g by mouth 2 (two) times  daily with a meal.   trimethoprim 100 MG tablet Commonly known as:  TRIMPEX Take 100 mg by mouth daily.       Review of Systems:  Review of Systems  Constitutional: Positive for weight loss. Negative for chills, fever and malaise/fatigue.  HENT: Positive for hearing loss and sore throat. Negative for congestion, ear discharge, ear pain and sinus pain.   Eyes: Negative for blurred vision.       Glasses  Respiratory: Negative for cough and shortness of breath.   Cardiovascular: Negative for chest pain, palpitations and leg swelling.  Gastrointestinal: Negative for abdominal pain.  Genitourinary: Negative for dysuria.  Musculoskeletal: Negative for falls and joint pain.       Unsteady gait  Skin: Negative for itching and rash.  Neurological: Negative for dizziness, loss of consciousness and weakness.  Psychiatric/Behavioral: Positive for memory loss.    Health Maintenance  Topic Date Due  . INFLUENZA VACCINE  06/21/2017  . TETANUS/TDAP  01/05/2026  . DEXA SCAN  Completed  . PNA vac Low Risk Adult  Completed    Physical Exam: Vitals:   07/12/17 1335  BP: 138/70  Pulse: 62  Temp: 97.8 F (36.6 C)  TempSrc: Oral  SpO2: 99%  Weight: 117 lb (53.1 kg)   Body mass index is 20.73 kg/m. Physical Exam  Constitutional: She appears well-developed. No distress.  HENT:  Head: Normocephalic and atraumatic.  Right Ear: External ear normal.  Left Ear: External ear normal.  Nose: Nose normal.  Mouth/Throat: Oropharynx is clear and moist. No oropharyngeal exudate.  Eyes: Pupils are equal, round, and reactive to light. Conjunctivae and EOM are normal.  Neck: Neck supple. No JVD present. No thyromegaly present.  Cardiovascular: Normal rate, regular rhythm and normal heart sounds.   Pulmonary/Chest: Effort normal and breath sounds normal.  Abdominal: Soft. Bowel sounds are normal.  Lymphadenopathy:    She has cervical adenopathy.  Neurological: She is alert.  Very poor historian   Skin: Skin is warm and dry.  Psychiatric: She has a normal mood and affect.    Labs reviewed: Basic Metabolic Panel:  Recent Labs  04/14/17 1224  NA 131*  K 4.9  CL 96*  CO2 26  GLUCOSE 89  BUN 20  CREATININE 0.85  CALCIUM 9.3  TSH 1.10   Liver Function Tests:  Recent Labs  04/14/17 1224  AST 18  ALT 11  ALKPHOS 53  BILITOT 0.6  PROT 6.5  ALBUMIN 3.9   No results for input(s): LIPASE, AMYLASE in the last 8760 hours.  No results for input(s): AMMONIA in the last 8760 hours. CBC:  Recent Labs  04/14/17 1224  WBC 6.6  NEUTROABS 4,224  HGB 13.8  HCT 38.1  MCV 93.6  PLT 205   Lipid Panel: No results for input(s): CHOL, HDL, LDLCALC, TRIG, CHOLHDL, LDLDIRECT in the last 8760 hours. No results found for: HGBA1C  Procedures since last visit: No results found.  Assessment/Plan 1. Left cervical lymphadenopathy -cont to watch lymph node -check cbc with diff when admitted to [facility] -if not better in 1 month, will need soft tissue US of neck and possibly biopsy of node, but suspect more upper respiratory that will resolve due to tenderness and mobility of node  2. Late onset Alzheimer's disease with behavioral disturbance -for [facility] admission next week -pt has hallucinations and delusions, also visual agnosia and worsening gait disturbance  3. Multiple thyroid nodules -was felt to be benign and tsh normal -f/u if node not resolving  Labs/tests ordered:  Cbc with diff at [facility] admission Next appt: next Tuesday in memory care  [place]. Sharai Overbay, D.O. [facility] [address], [place] [address] Cell Phone (Mon-Fri 8am-5pm):  [phone] On Call:  [phone] & follow prompts after 5pm & weekends Office Phone:  [phone] Office Fax:  [phone]

## 2017-07-13 DIAGNOSIS — R481 Agnosia: Secondary | ICD-10-CM | POA: Diagnosis not present

## 2017-07-13 DIAGNOSIS — G301 Alzheimer's disease with late onset: Secondary | ICD-10-CM | POA: Diagnosis not present

## 2017-07-13 DIAGNOSIS — R278 Other lack of coordination: Secondary | ICD-10-CM | POA: Diagnosis not present

## 2017-07-14 ENCOUNTER — Telehealth: Payer: Self-pay | Admitting: *Deleted

## 2017-07-14 NOTE — Telephone Encounter (Signed)
Asking for verbal order to continue current medications, pt being admitted to memory care at [facility].  Gave verbal ok.

## 2017-07-16 DIAGNOSIS — R278 Other lack of coordination: Secondary | ICD-10-CM | POA: Diagnosis not present

## 2017-07-16 DIAGNOSIS — R481 Agnosia: Secondary | ICD-10-CM | POA: Diagnosis not present

## 2017-07-16 DIAGNOSIS — G301 Alzheimer's disease with late onset: Secondary | ICD-10-CM | POA: Diagnosis not present

## 2017-07-17 ENCOUNTER — Encounter: Payer: Self-pay | Admitting: Internal Medicine

## 2017-07-17 DIAGNOSIS — D649 Anemia, unspecified: Secondary | ICD-10-CM | POA: Diagnosis not present

## 2017-07-17 DIAGNOSIS — G301 Alzheimer's disease with late onset: Secondary | ICD-10-CM | POA: Diagnosis not present

## 2017-07-17 DIAGNOSIS — R481 Agnosia: Secondary | ICD-10-CM | POA: Diagnosis not present

## 2017-07-17 DIAGNOSIS — R278 Other lack of coordination: Secondary | ICD-10-CM | POA: Diagnosis not present

## 2017-07-17 LAB — CBC AND DIFFERENTIAL
HCT: 38 (ref 36–46)
Hemoglobin: 13 (ref 12.0–16.0)
Platelets: 190 (ref 150–399)
WBC: 6.1

## 2017-07-18 ENCOUNTER — Non-Acute Institutional Stay: Payer: Medicare Other | Admitting: Internal Medicine

## 2017-07-18 DIAGNOSIS — R590 Localized enlarged lymph nodes: Secondary | ICD-10-CM | POA: Diagnosis not present

## 2017-07-18 DIAGNOSIS — I482 Chronic atrial fibrillation, unspecified: Secondary | ICD-10-CM

## 2017-07-18 DIAGNOSIS — R278 Other lack of coordination: Secondary | ICD-10-CM | POA: Diagnosis not present

## 2017-07-18 DIAGNOSIS — F321 Major depressive disorder, single episode, moderate: Secondary | ICD-10-CM | POA: Diagnosis not present

## 2017-07-18 DIAGNOSIS — G301 Alzheimer's disease with late onset: Secondary | ICD-10-CM

## 2017-07-18 DIAGNOSIS — F0281 Dementia in other diseases classified elsewhere with behavioral disturbance: Secondary | ICD-10-CM | POA: Diagnosis not present

## 2017-07-18 DIAGNOSIS — R441 Visual hallucinations: Secondary | ICD-10-CM | POA: Diagnosis not present

## 2017-07-18 DIAGNOSIS — E042 Nontoxic multinodular goiter: Secondary | ICD-10-CM | POA: Diagnosis not present

## 2017-07-18 DIAGNOSIS — E871 Hypo-osmolality and hyponatremia: Secondary | ICD-10-CM

## 2017-07-18 DIAGNOSIS — N3941 Urge incontinence: Secondary | ICD-10-CM

## 2017-07-18 DIAGNOSIS — F02818 Dementia in other diseases classified elsewhere, unspecified severity, with other behavioral disturbance: Secondary | ICD-10-CM

## 2017-07-18 DIAGNOSIS — R483 Visual agnosia: Secondary | ICD-10-CM

## 2017-07-18 DIAGNOSIS — R481 Agnosia: Secondary | ICD-10-CM | POA: Diagnosis not present

## 2017-07-18 NOTE — Progress Notes (Signed)
Provider:  Rexene Edison. Mariea Clonts, D.O., C.M.D. Location:  [place] Room Number: [place] of Service:  ALF (13)  PCP: [name], DO Patient Care Team: [name], DO as PCP - General (Geriatric Medicine) [name], MD as Consulting Physician (Cardiology) Community, Well [name], MD as Consulting Physician (Gastroenterology) [name], [name], MD as Consulting Physician (Dermatology) [name], MD as Consulting Physician (Cardiology) [name], MD as Consulting Physician (Ophthalmology) Zimmer, [name], NP as Nurse Practitioner (Internal Medicine) [name], MD as Consulting Physician (Gynecology)  Extended Emergency Contact Information Primary Emergency Contact: [facility] Address: [address]          [place], [place] [address] [name] of [place] Phone: [phone] Work Phone: [phone] Mobile Phone: [phone] Relation: Spouse  Code Status: DNR Goals of Care: Advanced Directive information Advanced Directives 06/21/2017  Does Patient Have a Medical Advance Directive? Yes  Type of Paramedic of [place];Living will  Does patient want to make changes to medical advance directive? No - Patient declined  Copy of [place] in Chart? Yes  Would patient like information on creating a medical advance directive? -   Chief Complaint  Patient presents with  . new admit to memory care    memory care admission due to progressive dementia    HPI: Patient is a 83 y.o. female seen today for admission to memory care due to progression of her dementia.  She is said to have alzheimer's.  She has visual agnosia and also has hallucinations and delusions.  She believes she and her husband have three dogs when they have just one, Josie.  She also does not realize her husband is himself in the late afternoons and evening  after a nap.    I recently saw her due to left sided lymphadenopathy.  She does not c/o pain in that area now.  She has since had antibiotics with augmentin from her dentist though I was told he didn't find anything.    Her depression had been doing much better on remeron.  There are currently no reports of nightmares like she was noted to have at home.    She remains on eliquis for atrial fibrillation anticoagulation and is taking toprol xl for rate control.    For her bones, she takes ca with D and additional D.    She has chronic incontinence and was not changing her pads regularly at home.  She was getting recurrent UTIs.  She is on trimethoprim therapy through urology for these.    Past Medical History:  Diagnosis Date  . Actinic keratoses   . Allergy   . Alzheimer disease 05/21/2015  . Anxiety   . Arrhythmia    atrial fib  . Atrial fibrillation ([place]) 12/2012  . Basal cell carcinoma of skin   . Cataracts, both eyes 2005, 2013   Cataract extraction/IOL implant  . Current use of long term anticoagulation 12/2012   Eliquis  . Depression   . Diverticulosis 2008  . Fracture of scaphoid bone of hand 08/05/2004  . History of UTI   . Hyperlipidemia   . Hypertension   . Hyponatremia    SIADH  . Low sodium levels   . Mild cognitive impairment   . Osteopenia   . Rosacea   . Squamous cell carcinoma   . Thyroid nodule    Past Surgical History:  Procedure Laterality Date  .  ABDOMINAL HYSTERECTOMY  1970   TAH-BSO  . cataracts Bilateral 2/05 and 3/05  . CESAREAN SECTION     questionable    Social History   Social History  . Marital status: Married    Spouse name: N/A  . Number of children: 3  . Years of education: N/A   Occupational History  . Retired    Social History Main Topics  . Smoking status: Former Research scientist (life sciences)  . Smokeless tobacco: Never Used  . Alcohol use 2.4 - 3.0 oz/week    4 - 5 Glasses of wine per week  . Drug use: No  . Sexual activity: Not Asked    Other Topics Concern  . None   Social History Narrative   Patient is Married Research scientist (physical sciences)), homemaker. Lives in a single level home, Independent Living section at [facility] since 2013. Lives with spouse, pet dog (C[place]by)   [place] smoking prior to 1980, No  Alcohol history   Patient has  no Advanced planning documents.      Diet?  Well balanced      Do you drink/eat things with caffeine? One 8 oz cup of coffee /day, usually 1/2 decaf      Marital status?         married                          What year were you married? 1997      Do you live in a house, apartment, assisted living, condo, trailer, etc.? [place], [place]      Is it one or more stories?  no      How many persons live in your home? two      Do you have any pets in your home? (please list) yes, one dog, Josie, [place]      Current or past profession: Audiological scientist, [employer]      Do you exercise?       no                               Type & how often?      Do you have a living will? Yes      Do you have a DNR form?       no                           If not, do you want to discuss one?  yes      Do you have signed POA/HPOA for forms?  yes       reports that she has quit smoking. She has never used smokeless tobacco. She reports that she drinks about 2.4 - 3.0 oz of alcohol per week . She reports that she does not use drugs.  Functional Status Survey:  requires assistance with bathing, some also with dressing, grooming, and toileting, feeds herself  Family History  Problem Relation Age of Onset  . Breast cancer Mother   . Crohn's disease Mother   . Stroke Father 26  . Alzheimer's disease Sister   . Colon cancer Sister   . Colon polyps Sister   . Breast cancer Daughter        x 2  . Graves' disease Daughter   . Alcohol abuse Son   . Stroke Paternal Grandfather   . Graves' disease Daughter   .  Breast cancer Daughter   . Breast cancer Daughter   .  Kidney disease Neg Hx   . Liver disease Neg Hx   . Esophageal cancer Neg Hx   . Pancreatic cancer Neg Hx     Health Maintenance  Topic Date Due  . INFLUENZA VACCINE  06/21/2017  . TETANUS/TDAP  01/05/2026  . DEXA SCAN  Completed  . PNA vac Low Risk Adult  Completed    Allergies  Allergen Reactions  . Solifenacin Other (See Comments)  . Ciprofloxacin Other (See Comments)    Reaction unknown  . Latex Other (See Comments)    Reaction unknown  . Macrobid [Nitrofurantoin Macrocrystal] Nausea And Vomiting  . Macrodantin [Nitrofurantoin] Nausea Only    Upset stomach  . Other Other (See Comments)    Popcorn- Diverticulosis  . Strawberry Extract Other (See Comments)    Diverticulosis   . Tramadol Nausea Only    Allergies as of 07/18/2017      Reactions   Solifenacin Other (See Comments)   Ciprofloxacin Other (See Comments)   Reaction unknown   Latex Other (See Comments)   Reaction unknown   Macrobid [nitrofurantoin Macrocrystal] Nausea And Vomiting   Macrodantin [nitrofurantoin] Nausea Only   Upset stomach   Other Other (See Comments)   Popcorn- Diverticulosis   Strawberry Extract Other (See Comments)   Diverticulosis    Tramadol Nausea Only      Medication List       Accurate as of 07/18/17 11:59 PM. Always use your most recent med list.          amoxicillin-clavulanate 875-125 MG tablet Commonly known as:  AUGMENTIN Take 1 tablet by mouth 2 (two) times daily.   apixaban 2.5 MG Tabs tablet Commonly known as:  ELIQUIS Take 1 tablet (2.5 mg total) by mouth 2 (two) times daily.   CALCIUM 600+D PO Take by mouth daily.   cholecalciferol 1000 units tablet Commonly known as:  VITAMIN D Take 2,000 Units by mouth daily.   metoprolol succinate 25 MG 24 hr tablet Commonly known as:  TOPROL-XL Take 12.5 mg by mouth daily.   mirtazapine 7.5 MG tablet Commonly known as:  REMERON Take 1 tablet (7.5 mg total) by mouth at bedtime.   multivitamin tablet Take 1  tablet by mouth daily.   PROBIOTIC PO Take 1 capsule by mouth every morning.   sodium chloride 1 g tablet Take 1 g by mouth 2 (two) times daily with a meal.   trimethoprim 100 MG tablet Commonly known as:  TRIMPEX Take 100 mg by mouth daily.       Review of Systems  Constitutional: Negative for chills, fever and malaise/fatigue.  HENT: Positive for hearing loss. Negative for congestion.        Lymphadenopathy left submandibular  Eyes: Positive for blurred vision.       Visual agnosia, glasses  Respiratory: Negative for cough and shortness of breath.   Cardiovascular: Negative for chest pain, palpitations and leg swelling.  Gastrointestinal: Negative for abdominal pain, blood in stool, constipation and melena.  Genitourinary: Negative for dysuria.  Musculoskeletal: Negative for falls.  Skin: Negative for itching and rash.  Neurological: Negative for weakness and headaches.  Endo/Heme/Allergies: Bruises/bleeds easily.  Psychiatric/Behavioral: Positive for depression, hallucinations and memory loss. Negative for suicidal ideas. The patient does not have insomnia.     Vitals:   07/18/17 1930  BP: 130/80  Pulse: (!) 53  Resp: 16  Temp: 98.2 F (36.8 C)  SpO2: 97%  Weight: 116 lb 3.2 oz (52.7 kg)  Height: 5\' 3"  (1.6 m)   Body mass index is 20.58 kg/m. Physical Exam  Constitutional: No distress.  HENT:  Head: Normocephalic and atraumatic.  Right Ear: External ear normal.  Left Ear: External ear normal.  Nose: Nose normal.  Mouth/Throat: Oropharynx is clear and moist. No oropharyngeal exudate.  Eyes: Pupils are equal, round, and reactive to light. EOM are normal.  Neck: Neck supple. No JVD present.  Left submandibular adenopathy  Cardiovascular: Intact distal pulses.   irreg irreg  Pulmonary/Chest: Effort normal and breath sounds normal. No respiratory distress. She has no wheezes. She has no rales.  Abdominal: Soft. Bowel sounds are normal. She exhibits no  distension and no mass. There is no tenderness. There is no rebound and no guarding.  Musculoskeletal: Normal range of motion.  Unsteady gait, small steps, needs support of others to walk steadily  Neurological: She is alert.  Skin: Skin is warm and dry. Capillary refill takes less than 2 seconds.  Psychiatric: She has a normal mood and affect.    Labs reviewed:CBC  8/27 with wbc 6.1, h/h 13/38.4 and platelets 161  Basic Metabolic Panel:  Recent Labs  04/14/17 1224  NA 131*  K 4.9  CL 96*  CO2 26  GLUCOSE 89  BUN 20  CREATININE 0.85  CALCIUM 9.3   Liver Function Tests:  Recent Labs  04/14/17 1224  AST 18  ALT 11  ALKPHOS 53  BILITOT 0.6  PROT 6.5  ALBUMIN 3.9   No results for input(s): LIPASE, AMYLASE in the last 8760 hours. No results for input(s): AMMONIA in the last 8760 hours. CBC:  Recent Labs  04/14/17 1224  WBC 6.6  NEUTROABS 4,224  HGB 13.8  HCT 38.1  MCV 93.6  PLT 205   Cardiac Enzymes: No results for input(s): CKTOTAL, CKMB, CKMBINDEX, TROPONINI in the last 8760 hours. BNP: Invalid input(s): POCBNP No results found for: HGBA1C Lab Results  Component Value Date   TSH 1.10 04/14/2017   No results found for: VITAMINB12 No results found for: FOLATE No results found for: IRON, TIBC, FERRITIN  Imaging and Procedures obtained prior to SNF admission: No results found.  Assessment/Plan 1. Left cervical lymphadenopathy -seems to be submandibular -completing augmentin for possible dental concern which would make logical sense -pt's husband was advised that if this persisted after 4 wks, we'd need to do a neck ultrasound to evaluate this and her thyroid nodules  2. Late onset Alzheimer's disease with behavioral disturbance -progressive and at late stage now with need for adl assistance, incontinence and progressing gait disturbance  3. Multiple thyroid nodules -in the past, not on thyroid medication and normal TSH in May  4. Depression,  major, single episode, moderate (HCC) Ongoing, adjusting to being separated from her husband -unfortunately, she did not always know who he was at home and needs the care provided in memory care -cont remeron which helped to stabilize her weight and improved her mood and sleep  5. Visual hallucinations -about multiple dogs, other men in her home, did not do well with seroquel or lexapro in the past  6. 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 Chronic atrial fibrillation (HCC) -ongoing, cont eliquis and toprol xl  9. Hyponatremia -has had low sodium, but it's been chronic  Family/ staff Communication: discussed with memory care nursing, pt's husband, Trip  Labs/tests ordered:  Had cbc 8/27  Janiyah Beery L. Malaia Buchta, D.O. [facility] [address], [place] [address] Cell Phone (Mon-Fri 8am-5pm):  [phone] On Call:  [phone] & follow prompts after 5pm & weekends Office Phone:  [phone] Office Fax:  [phone]

## 2017-07-23 ENCOUNTER — Encounter: Payer: Self-pay | Admitting: Internal Medicine

## 2017-07-25 ENCOUNTER — Encounter: Payer: Self-pay | Admitting: Internal Medicine

## 2017-07-25 ENCOUNTER — Telehealth: Payer: Self-pay | Admitting: *Deleted

## 2017-07-25 DIAGNOSIS — R481 Agnosia: Secondary | ICD-10-CM | POA: Diagnosis not present

## 2017-07-25 DIAGNOSIS — R278 Other lack of coordination: Secondary | ICD-10-CM | POA: Diagnosis not present

## 2017-07-25 DIAGNOSIS — G301 Alzheimer's disease with late onset: Secondary | ICD-10-CM | POA: Diagnosis not present

## 2017-07-25 NOTE — Telephone Encounter (Signed)
Received fax from [employer] for [place]. Patient applying for benefits and need Attending Physician's Statement. Placed in Dr. Magdalene Molly folder for review. To be faxed back to Fax: [phone]

## 2017-07-26 DIAGNOSIS — G301 Alzheimer's disease with late onset: Secondary | ICD-10-CM | POA: Diagnosis not present

## 2017-07-26 DIAGNOSIS — R481 Agnosia: Secondary | ICD-10-CM | POA: Diagnosis not present

## 2017-07-26 DIAGNOSIS — R278 Other lack of coordination: Secondary | ICD-10-CM | POA: Diagnosis not present

## 2017-07-27 ENCOUNTER — Telehealth: Payer: Self-pay | Admitting: *Deleted

## 2017-07-27 DIAGNOSIS — G301 Alzheimer's disease with late onset: Secondary | ICD-10-CM | POA: Diagnosis not present

## 2017-07-27 DIAGNOSIS — R481 Agnosia: Secondary | ICD-10-CM | POA: Diagnosis not present

## 2017-07-27 DIAGNOSIS — R278 Other lack of coordination: Secondary | ICD-10-CM | POA: Diagnosis not present

## 2017-07-27 DIAGNOSIS — Z029 Encounter for administrative examinations, unspecified: Secondary | ICD-10-CM

## 2017-07-27 NOTE — Telephone Encounter (Signed)
Call and spoke with nurse manager at [facility] and she was aware of the situation with Mr. Wyeth. I faxed her a copy of Dr. Magdalene Molly last note and per Dr. Mariea Clonts:   Left cervical lymphadenopathy -seems to be submandibular -completing augmentin for possible dental concern which would make logical sense -pt's husband was advised that if this persisted after 4 wks, we'd need to do a neck ultrasound to evaluate this and her thyroid nodules  They will discuss with Mr. Elling.

## 2017-07-27 NOTE — Telephone Encounter (Signed)
Husband calling asking for his wife to be seen at [facility] memory care at [facility]. Per husband he asked that she be seen this week and no one returned his call. Pt is still having neck pain and wanted pt seen asap. Pt is not in memory care now she's with him. I advised husband to speak with the nurse at the desk. Per husband he will.

## 2017-07-28 DIAGNOSIS — G301 Alzheimer's disease with late onset: Secondary | ICD-10-CM | POA: Diagnosis not present

## 2017-07-28 DIAGNOSIS — R278 Other lack of coordination: Secondary | ICD-10-CM | POA: Diagnosis not present

## 2017-07-28 DIAGNOSIS — R481 Agnosia: Secondary | ICD-10-CM | POA: Diagnosis not present

## 2017-07-31 DIAGNOSIS — G301 Alzheimer's disease with late onset: Secondary | ICD-10-CM | POA: Diagnosis not present

## 2017-07-31 DIAGNOSIS — R481 Agnosia: Secondary | ICD-10-CM | POA: Diagnosis not present

## 2017-07-31 DIAGNOSIS — R278 Other lack of coordination: Secondary | ICD-10-CM | POA: Diagnosis not present

## 2017-08-01 ENCOUNTER — Non-Acute Institutional Stay: Payer: Medicare Other | Admitting: Internal Medicine

## 2017-08-01 ENCOUNTER — Encounter: Payer: Self-pay | Admitting: Internal Medicine

## 2017-08-01 DIAGNOSIS — R6884 Jaw pain: Secondary | ICD-10-CM | POA: Diagnosis not present

## 2017-08-01 DIAGNOSIS — E042 Nontoxic multinodular goiter: Secondary | ICD-10-CM | POA: Diagnosis not present

## 2017-08-01 DIAGNOSIS — R481 Agnosia: Secondary | ICD-10-CM | POA: Diagnosis not present

## 2017-08-01 DIAGNOSIS — R590 Localized enlarged lymph nodes: Secondary | ICD-10-CM

## 2017-08-01 DIAGNOSIS — R278 Other lack of coordination: Secondary | ICD-10-CM | POA: Diagnosis not present

## 2017-08-01 DIAGNOSIS — G301 Alzheimer's disease with late onset: Secondary | ICD-10-CM | POA: Diagnosis not present

## 2017-08-01 NOTE — Progress Notes (Signed)
Location:  [place] Room Number: [place] of Service:  ALF (13)ALF memory care Provider:  Francis Doenges L. Mariea Clonts, D.O., C.M.D.  [name], DO  Patient Care Team: [name], DO as PCP - General (Geriatric Medicine) [name], MD as Consulting Physician (Cardiology) Community, Well [name], MD as Consulting Physician (Gastroenterology) [name], [name], MD as Consulting Physician (Dermatology) [name], MD as Consulting Physician (Cardiology) [name], MD as Consulting Physician (Ophthalmology) Zimmer, [name], NP as Nurse Practitioner (Internal Medicine) [name], MD as Consulting Physician (Gynecology)  Extended Emergency Contact Information Primary Emergency Contact: [facility] Address: [address]          [place], [place] [address] [name] of [place] Phone: [phone] Work Phone: [phone] Mobile Phone: [phone] Relation: Spouse  Code Status:  DNR Goals of care: Advanced Directive information Advanced Directives 06/21/2017  Does Patient Have a Medical Advance Directive? Yes  Type of Paramedic of [place];Living will  Does patient want to make changes to medical advance directive? No - Patient declined  Copy of [place] in Chart? Yes  Would patient like information on creating a medical advance directive? -   Chief Complaint  Patient presents with  . Acute Visit    swollen lymph node, painful at times    HPI:  Pt is a 83 y.o. female seen today for an acute visit for persistent swollen lymph node, painful at times.  Her husband requested a f/u visit.  It had not been 4 weeks, but left side of Sanam's neck continues to be painful at times and remains swollen.  He also mentions it's been 6 years since her thyroid was reimaged and she had nodules on it that had been felt to be  benign after not changing for several years.  She's been afebrile w/o sore throat.  She reports some jaw pain at times which can affect either side of her jaw.  It was not tender today.  Turns out the abx she got were from [facility] urgent care not the dentist that saw her here--she completed augmentin therapy for 10 days.    Past Medical History:  Diagnosis Date  . Actinic keratoses   . Allergy   . Alzheimer disease 05/21/2015  . Anxiety   . Arrhythmia    atrial fib  . Atrial fibrillation ([place]) 12/2012  . Basal cell carcinoma of skin   . Cataracts, both eyes 2005, 2013   Cataract extraction/IOL implant  . Current use of long term anticoagulation 12/2012   Eliquis  . Depression   . Diverticulosis 2008  . Fracture of scaphoid bone of hand 08/05/2004  . History of UTI   . Hyperlipidemia   . Hypertension   . Hyponatremia    SIADH  . Low sodium levels   . Mild cognitive impairment   . Osteopenia   . Rosacea   . Squamous cell carcinoma   . Thyroid nodule    Past Surgical History:  Procedure Laterality Date  . ABDOMINAL HYSTERECTOMY  1970   TAH-BSO  . cataracts Bilateral 2/05 and 3/05  . CESAREAN SECTION     questionable    Allergies  Allergen Reactions  . Solifenacin Other (See Comments)  . Ciprofloxacin Other (See Comments)    Reaction unknown  . Latex Other (See Comments)    Reaction unknown  . Macrobid [Nitrofurantoin Macrocrystal] Nausea And Vomiting  . Macrodantin [Nitrofurantoin] Nausea  Only    Upset stomach  . Other Other (See Comments)    Popcorn- Diverticulosis  . Strawberry Extract Other (See Comments)    Diverticulosis   . Tramadol Nausea Only    Allergies as of 08/01/2017      Reactions   Solifenacin Other (See Comments)   Ciprofloxacin Other (See Comments)   Reaction unknown   Latex Other (See Comments)   Reaction unknown   Macrobid [nitrofurantoin Macrocrystal] Nausea And Vomiting   Macrodantin [nitrofurantoin] Nausea Only   Upset stomach   Other  Other (See Comments)   Popcorn- Diverticulosis   Strawberry Extract Other (See Comments)   Diverticulosis    Tramadol Nausea Only      Medication List       Accurate as of 08/01/17  4:51 PM. Always use your most recent med list.          apixaban 2.5 MG Tabs tablet Commonly known as:  ELIQUIS Take 1 tablet (2.5 mg total) by mouth 2 (two) times daily.   CALCIUM 600+D PO Take by mouth daily.   cholecalciferol 1000 units tablet Commonly known as:  VITAMIN D Take 2,000 Units by mouth daily.   metoprolol succinate 25 MG 24 hr tablet Commonly known as:  TOPROL-XL Take 12.5 mg by mouth daily.   mirtazapine 7.5 MG tablet Commonly known as:  REMERON Take 1 tablet (7.5 mg total) by mouth at bedtime.   multivitamin tablet Take 1 tablet by mouth daily.   PROBIOTIC PO Take 1 capsule by mouth every morning.   sodium chloride 1 g tablet Take 1 g by mouth 2 (two) times daily with a meal.   trimethoprim 100 MG tablet Commonly known as:  TRIMPEX Take 100 mg by mouth daily.       Review of Systems  Constitutional: Negative for chills, fever and malaise/fatigue.  HENT:       Jaw pain, left neck tenderness  Eyes: Negative for blurred vision.  Respiratory: Negative for shortness of breath.   Cardiovascular: Negative for chest pain and palpitations.  Neurological: Negative for weakness and headaches.  Psychiatric/Behavioral: Positive for memory loss.    Immunization History  Administered Date(s) Administered  . H1N1 12/04/2008  . Influenza-Unspecified 09/15/2003, 11/01/2010, 09/12/2011, 07/22/2016  . Pneumococcal Conjugate-13 04/14/2017  . Pneumococcal Polysaccharide-23 12/19/2008  . Td 09/15/2003, 01/06/2016   Pertinent  Health Maintenance Due  Topic Date Due  . INFLUENZA VACCINE  06/21/2017  . DEXA SCAN  Completed  . PNA vac Low Risk Adult  Completed   Fall Risk  07/12/2017 06/21/2017 05/04/2017 03/08/2017 02/02/2017  Falls in the past year? No No No No No    Functional Status Survey:    Vitals:   08/01/17 1649  BP: (!) 129/55  Pulse: (!) 56  Resp: 16  SpO2: 99%  Weight: 115 lb 9.6 oz (52.4 kg)   Body mass index is 20.48 kg/m. Physical Exam  Constitutional:  Thin white female  Neck:  Left about 1.5cm lymph node persists in submandibular region  Pulmonary/Chest: Effort normal.  Lymphadenopathy:    She has cervical adenopathy.  Neurological: She is alert.  Skin: Skin is warm and dry.    Labs reviewed:  Recent Labs  04/14/17 1224  NA 131*  K 4.9  CL 96*  CO2 26  GLUCOSE 89  BUN 20  CREATININE 0.85  CALCIUM 9.3    Recent Labs  04/14/17 1224  AST 18  ALT 11  ALKPHOS 53  BILITOT 0.6  PROT  6.5  ALBUMIN 3.9    Recent Labs  04/14/17 1224  WBC 6.6  NEUTROABS 4,224  HGB 13.8  HCT 38.1  MCV 93.6  PLT 205   Lab Results  Component Value Date   TSH 1.10 04/14/2017   Assessment/Plan 1. Left cervical lymphadenopathy Neck ultrasound ordered to evaluate persistent lymph node in the left submandibular region, jaw pain, soreness of the node at times and h/o thyroid nodules No current signs of infection or pain  2. Pain in bone of jaw -not at this time, but comes and goes, no visual changes  3. Multiple thyroid nodules -not evaluated for several years b/c they had been stable -last eval was before moving here per her husband -check neck US  Family/ staff Communication: discussed with DON and pt's husband, Trip  Labs/tests ordered:  Neck US, portable  Krystian Ferrentino L. Dabria Wadas, D.O. [facility] [address], [place] [address] Cell Phone (Mon-Fri 8am-5pm):  [phone] On Call:  [phone] & follow prompts after 5pm & weekends Office Phone:  [phone] Office Fax:  [phone]

## 2017-08-02 DIAGNOSIS — R278 Other lack of coordination: Secondary | ICD-10-CM | POA: Diagnosis not present

## 2017-08-02 DIAGNOSIS — G301 Alzheimer's disease with late onset: Secondary | ICD-10-CM | POA: Diagnosis not present

## 2017-08-02 DIAGNOSIS — R481 Agnosia: Secondary | ICD-10-CM | POA: Diagnosis not present

## 2017-08-02 NOTE — Telephone Encounter (Signed)
Patient was seen yesterday in memory care, pt's husband was also in with visit.

## 2017-08-03 DIAGNOSIS — M79671 Pain in right foot: Secondary | ICD-10-CM | POA: Diagnosis not present

## 2017-08-04 ENCOUNTER — Ambulatory Visit: Payer: Medicare Other

## 2017-08-04 DIAGNOSIS — R481 Agnosia: Secondary | ICD-10-CM | POA: Diagnosis not present

## 2017-08-04 DIAGNOSIS — G301 Alzheimer's disease with late onset: Secondary | ICD-10-CM | POA: Diagnosis not present

## 2017-08-04 DIAGNOSIS — R278 Other lack of coordination: Secondary | ICD-10-CM | POA: Diagnosis not present

## 2017-08-07 DIAGNOSIS — R278 Other lack of coordination: Secondary | ICD-10-CM | POA: Diagnosis not present

## 2017-08-07 DIAGNOSIS — G301 Alzheimer's disease with late onset: Secondary | ICD-10-CM | POA: Diagnosis not present

## 2017-08-07 DIAGNOSIS — R481 Agnosia: Secondary | ICD-10-CM | POA: Diagnosis not present

## 2017-08-09 DIAGNOSIS — G301 Alzheimer's disease with late onset: Secondary | ICD-10-CM | POA: Diagnosis not present

## 2017-08-09 DIAGNOSIS — R481 Agnosia: Secondary | ICD-10-CM | POA: Diagnosis not present

## 2017-08-09 DIAGNOSIS — R278 Other lack of coordination: Secondary | ICD-10-CM | POA: Diagnosis not present

## 2017-08-13 DIAGNOSIS — G301 Alzheimer's disease with late onset: Secondary | ICD-10-CM | POA: Diagnosis not present

## 2017-08-13 DIAGNOSIS — R481 Agnosia: Secondary | ICD-10-CM | POA: Diagnosis not present

## 2017-08-13 DIAGNOSIS — R278 Other lack of coordination: Secondary | ICD-10-CM | POA: Diagnosis not present

## 2017-08-16 ENCOUNTER — Ambulatory Visit
Admission: RE | Admit: 2017-08-16 | Discharge: 2017-08-16 | Disposition: A | Discharge status: Home / Self Care | Payer: Medicare Other | Source: Ambulatory Visit | Attending: Internal Medicine | Admitting: Internal Medicine

## 2017-08-16 DIAGNOSIS — Z1231 Encounter for screening mammogram for malignant neoplasm of breast: Secondary | ICD-10-CM | POA: Diagnosis not present

## 2017-08-17 ENCOUNTER — Encounter: Payer: Self-pay | Admitting: Internal Medicine

## 2017-08-17 DIAGNOSIS — R481 Agnosia: Secondary | ICD-10-CM | POA: Diagnosis not present

## 2017-08-17 DIAGNOSIS — G301 Alzheimer's disease with late onset: Secondary | ICD-10-CM | POA: Diagnosis not present

## 2017-08-17 DIAGNOSIS — R278 Other lack of coordination: Secondary | ICD-10-CM | POA: Diagnosis not present

## 2017-08-17 NOTE — Addendum Note (Signed)
Addended by: [name] on: 08/17/2017 05:25 PM   Modules accepted: Level of Service

## 2017-08-18 ENCOUNTER — Encounter: Payer: Self-pay | Admitting: *Deleted

## 2017-08-18 DIAGNOSIS — R481 Agnosia: Secondary | ICD-10-CM | POA: Diagnosis not present

## 2017-08-18 DIAGNOSIS — R278 Other lack of coordination: Secondary | ICD-10-CM | POA: Diagnosis not present

## 2017-08-18 DIAGNOSIS — G301 Alzheimer's disease with late onset: Secondary | ICD-10-CM | POA: Diagnosis not present

## 2017-08-24 DIAGNOSIS — G301 Alzheimer's disease with late onset: Secondary | ICD-10-CM | POA: Diagnosis not present

## 2017-08-24 DIAGNOSIS — R481 Agnosia: Secondary | ICD-10-CM | POA: Diagnosis not present

## 2017-08-24 DIAGNOSIS — R278 Other lack of coordination: Secondary | ICD-10-CM | POA: Diagnosis not present

## 2017-08-25 DIAGNOSIS — R278 Other lack of coordination: Secondary | ICD-10-CM | POA: Diagnosis not present

## 2017-08-25 DIAGNOSIS — G301 Alzheimer's disease with late onset: Secondary | ICD-10-CM | POA: Diagnosis not present

## 2017-08-25 DIAGNOSIS — R481 Agnosia: Secondary | ICD-10-CM | POA: Diagnosis not present

## 2017-08-28 DIAGNOSIS — Z85828 Personal history of other malignant neoplasm of skin: Secondary | ICD-10-CM | POA: Diagnosis not present

## 2017-08-28 DIAGNOSIS — L821 Other seborrheic keratosis: Secondary | ICD-10-CM | POA: Diagnosis not present

## 2017-08-29 DIAGNOSIS — R481 Agnosia: Secondary | ICD-10-CM | POA: Diagnosis not present

## 2017-08-29 DIAGNOSIS — R278 Other lack of coordination: Secondary | ICD-10-CM | POA: Diagnosis not present

## 2017-08-29 DIAGNOSIS — G301 Alzheimer's disease with late onset: Secondary | ICD-10-CM | POA: Diagnosis not present

## 2017-08-30 DIAGNOSIS — R481 Agnosia: Secondary | ICD-10-CM | POA: Diagnosis not present

## 2017-08-30 DIAGNOSIS — G301 Alzheimer's disease with late onset: Secondary | ICD-10-CM | POA: Diagnosis not present

## 2017-08-30 DIAGNOSIS — R278 Other lack of coordination: Secondary | ICD-10-CM | POA: Diagnosis not present

## 2017-09-01 DIAGNOSIS — G301 Alzheimer's disease with late onset: Secondary | ICD-10-CM | POA: Diagnosis not present

## 2017-09-01 DIAGNOSIS — R278 Other lack of coordination: Secondary | ICD-10-CM | POA: Diagnosis not present

## 2017-09-01 DIAGNOSIS — R481 Agnosia: Secondary | ICD-10-CM | POA: Diagnosis not present

## 2017-09-03 DIAGNOSIS — R481 Agnosia: Secondary | ICD-10-CM | POA: Diagnosis not present

## 2017-09-03 DIAGNOSIS — R278 Other lack of coordination: Secondary | ICD-10-CM | POA: Diagnosis not present

## 2017-09-03 DIAGNOSIS — G301 Alzheimer's disease with late onset: Secondary | ICD-10-CM | POA: Diagnosis not present

## 2017-09-14 DIAGNOSIS — Z23 Encounter for immunization: Secondary | ICD-10-CM | POA: Diagnosis not present

## 2017-09-28 ENCOUNTER — Encounter: Payer: Self-pay | Admitting: Adult Health

## 2017-09-28 ENCOUNTER — Non-Acute Institutional Stay: Payer: Medicare Other | Admitting: Adult Health

## 2017-09-28 DIAGNOSIS — E871 Hypo-osmolality and hyponatremia: Secondary | ICD-10-CM | POA: Diagnosis not present

## 2017-09-28 DIAGNOSIS — I482 Chronic atrial fibrillation, unspecified: Secondary | ICD-10-CM

## 2017-09-28 DIAGNOSIS — F321 Major depressive disorder, single episode, moderate: Secondary | ICD-10-CM

## 2017-09-28 DIAGNOSIS — G301 Alzheimer's disease with late onset: Secondary | ICD-10-CM

## 2017-09-28 DIAGNOSIS — I10 Essential (primary) hypertension: Secondary | ICD-10-CM | POA: Diagnosis not present

## 2017-09-28 DIAGNOSIS — F02818 Dementia in other diseases classified elsewhere, unspecified severity, with other behavioral disturbance: Secondary | ICD-10-CM

## 2017-09-28 DIAGNOSIS — F0281 Dementia in other diseases classified elsewhere with behavioral disturbance: Secondary | ICD-10-CM

## 2017-09-28 NOTE — Assessment & Plan Note (Signed)
Continue sodium 1 gram BID. BMP annually or if symptomatic.

## 2017-09-28 NOTE — Assessment & Plan Note (Signed)
Seems like the Remeron may have helped. She has also gained 3 lbs since arriving to [facility].

## 2017-09-28 NOTE — Assessment & Plan Note (Signed)
Continue Eliquis for CVA risk reduction. Rate controlled.

## 2017-09-28 NOTE — Assessment & Plan Note (Signed)
Controlled. Continue Toprol XL 12.5 mg qd

## 2017-09-28 NOTE — Assessment & Plan Note (Signed)
Adjusting well in the memory care unit. Continue supportive care.  No currently on medication for memory.

## 2017-09-28 NOTE — Progress Notes (Signed)
Location:  Occupational psychologist of Service:  ALF (13) Provider:   [name], ANP [place] [phone]  [name], DO  Patient Care Team: [name], DO as PCP - General (Geriatric Medicine) [name], MD as Consulting Physician (Cardiology) Community, Well [name], MD as Consulting Physician (Gastroenterology) [name], [name], MD as Consulting Physician (Dermatology) [name], MD as Consulting Physician (Cardiology) [name], MD as Consulting Physician (Ophthalmology) Zimmer, [name], NP as Nurse Practitioner (Internal Medicine) [name], MD as Consulting Physician (Gynecology)  Extended Emergency Contact Information Primary Emergency Contact: [facility] Address: [address]          [place], [place] [address] [name] of [place] Phone: [phone] Work Phone: [phone] Mobile Phone: [phone] Relation: Spouse  Code Status:  DNR Goals of care: Advanced Directive information Advanced Directives 09/28/2017  Does Patient Have a Medical Advance Directive? Yes  Type of Paramedic of [place];Living will;Out of facility DNR (pink MOST or yellow form)  Does patient want to make changes to medical advance directive? -  Copy of [place] in Chart? Yes  Would patient like information on creating a medical advance directive? -  Pre-existing out of facility DNR order (yellow form or pink MOST form) Yellow form placed in chart (order not valid for inpatient use)     Chief Complaint  Patient presents with  . Medical Management of Chronic Issues    HPI:  Pt is a 83 y.o. female seen today for medical management of chronic diseases. She moved to memory care  In September and seems to have adjusted well to the unit. There are no complaints regarding her care. VS are stable. Her husband visits  regularly and brings the dog. She is ambulatory with a walker.  There was concern for an adenopathy of the neck. An ultrasound was ordered in Sept which was unremarkable.   MMSE 7/30  Afib: rate controlled on lopressor and eliquis for CVA risk reduction  Depression/Anxiety: has occasional periods of anxiety but does not need prn ativan Currently on Remeron 15 mg. She has a hx of Hallucinations per the notes but there were no reports of this when I asked the nurse  Wt Readings from Last 3 Encounters:  09/28/17 119 lb 6.4 oz (54.2 kg)  08/01/17 115 lb 9.6 oz (52.4 kg)  07/18/17 116 lb 3.2 oz (52.7 kg)  weights have increased by 3 lbs in the past few months.   VS reviewed and BP controlled  Hx of hyponatremia on sodium tablets. BMP monitored annually. NA 131 in May of 2018   Past Medical History:  Diagnosis Date  . Actinic keratoses   . Allergy   . Alzheimer disease 05/21/2015  . Anxiety   . Arrhythmia    atrial fib  . Atrial fibrillation ([place]) 12/2012  . Basal cell carcinoma of skin   . Cataracts, both eyes 2005, 2013   Cataract extraction/IOL implant  . Current use of long term anticoagulation 12/2012   Eliquis  . Depression   . Diverticulosis 2008  . Fracture of scaphoid bone of hand 08/05/2004  . History of UTI   . Hyperlipidemia   . Hypertension   . Hyponatremia    SIADH  . Low sodium levels   . Mild cognitive impairment   . Osteopenia   . Rosacea   . Squamous cell carcinoma   . Thyroid nodule  Past Surgical History:  Procedure Laterality Date  . ABDOMINAL HYSTERECTOMY  1970   TAH-BSO  . cataracts Bilateral 2/05 and 3/05  . CESAREAN SECTION     questionable    Allergies  Allergen Reactions  . Solifenacin Other (See Comments)  . Ciprofloxacin Other (See Comments)    Reaction unknown  . Latex Other (See Comments)    Reaction unknown  . Macrobid [Nitrofurantoin Macrocrystal] Nausea And Vomiting  . Macrodantin [Nitrofurantoin] Nausea Only    Upset  stomach  . Other Other (See Comments)    Popcorn- Diverticulosis  . Strawberry Extract Other (See Comments)    Diverticulosis   . Tramadol Nausea Only    Outpatient Encounter Medications as of 09/28/2017  Medication Sig  . apixaban (ELIQUIS) 2.5 MG TABS tablet Take 1 tablet (2.5 mg total) by mouth 2 (two) times daily.  . Calcium Carbonate-Vitamin D (CALCIUM 600+D PO) Take by mouth daily.  . cholecalciferol (VITAMIN D) 1000 UNITS tablet Take 2,000 Units by mouth daily.  . metoprolol succinate (TOPROL-XL) 25 MG 24 hr tablet Take 12.5 mg by mouth daily.  . mirtazapine (REMERON) 15 MG tablet Take 15 mg at bedtime by mouth.  . Multiple Vitamin (MULTIVITAMIN) tablet Take 1 tablet by mouth daily.  . Probiotic Product (PROBIOTIC PO) Take 1 capsule by mouth every morning.  . sodium chloride 1 G tablet Take 1 g by mouth 2 (two) times daily with a meal.  . trimethoprim (TRIMPEX) 100 MG tablet Take 100 mg by mouth daily.  . [DISCONTINUED] mirtazapine (REMERON) 7.5 MG tablet Take 1 tablet (7.5 mg total) by mouth at bedtime.   No facility-administered encounter medications on file as of 09/28/2017.     Review of Systems  Constitutional: Negative for activity change, appetite change, chills, diaphoresis, fatigue, fever and unexpected weight change.  HENT: Negative for congestion.   Respiratory: Negative for cough, shortness of breath and wheezing.   Cardiovascular: Negative for chest pain, palpitations and leg swelling.  Gastrointestinal: Negative for abdominal distention, abdominal pain, constipation and diarrhea.  Genitourinary: Negative for difficulty urinating and dysuria.  Musculoskeletal: Negative for arthralgias, back pain, gait problem, joint swelling and myalgias.  Neurological: Negative for dizziness, tremors, seizures, syncope, facial asymmetry, speech difficulty, weakness, light-headedness, numbness and headaches.  Psychiatric/Behavioral: Positive for confusion. Negative for agitation and  behavioral problems. The patient is nervous/anxious.     Immunization History  Administered Date(s) Administered  . H1N1 12/04/2008  . Influenza-Unspecified 09/15/2003, 11/01/2010, 09/12/2011, 07/22/2016  . Pneumococcal Conjugate-13 04/14/2017  . Pneumococcal Polysaccharide-23 12/19/2008  . Td 09/15/2003, 01/06/2016   Pertinent  Health Maintenance Due  Topic Date Due  . INFLUENZA VACCINE  06/21/2017  . DEXA SCAN  Completed  . PNA vac Low Risk Adult  Completed   Fall Risk  07/12/2017 06/21/2017 05/04/2017 03/08/2017 02/02/2017  Falls in the past year? No No No No No   Functional Status Survey:    Vitals:   09/28/17 1004  BP: 111/61  Pulse: 71  Resp: 20  Temp: (!) 97.1 F (36.2 C)  SpO2: 96%  Weight: 119 lb 6.4 oz (54.2 kg)   Body mass index is 21.15 kg/m. Physical Exam  Constitutional: No distress.  HENT:  Head: Normocephalic and atraumatic.  Neck: No JVD present.  Cardiovascular: Normal rate.  No murmur heard. Irregular. Trace edema to BLE  Pulmonary/Chest: Effort normal and breath sounds normal. No respiratory distress. She has no wheezes.  Abdominal: Soft. Bowel sounds are normal.  Neurological: She is alert.  Oriented to self only. Pleasant and able to f/c.  Skin: Skin is warm and dry. She is not diaphoretic.  Psychiatric: She has a normal mood and affect.    Labs reviewed: Recent Labs    04/14/17 1224  NA 131*  K 4.9  CL 96*  CO2 26  GLUCOSE 89  BUN 20  CREATININE 0.85  CALCIUM 9.3   Recent Labs    04/14/17 1224  AST 18  ALT 11  ALKPHOS 53  BILITOT 0.6  PROT 6.5  ALBUMIN 3.9   Recent Labs    04/14/17 1224 07/17/17  WBC 6.6 6.1  NEUTROABS 4,224  --   HGB 13.8 13.0  HCT 38.1 38  MCV 93.6  --   PLT 205 190   Lab Results  Component Value Date   TSH 1.10 04/14/2017   No results found for: HGBA1C No results found for: CHOL, HDL, LDLCALC, LDLDIRECT, TRIG, CHOLHDL  Significant Diagnostic Results in last 30 days:  No results  found.  Assessment/Plan  Hypertension Controlled. Continue Toprol XL 12.5 mg qd  Atrial fibrillation Continue Eliquis for CVA risk reduction. Rate controlled.  Late onset Alzheimer's disease with behavioral disturbance Adjusting well in the memory care unit. Continue supportive care.  No currently on medication for memory.  Hyponatremia Continue sodium 1 gram BID. BMP annually or if symptomatic.  Depression, major, single episode, moderate (HCC) Seems like the Remeron may have helped. She has also gained 3 lbs since arriving to [facility].   Family/ staff Communication: discussed with resident and staff  Labs/tests ordered:  NA

## 2017-10-11 DIAGNOSIS — M79642 Pain in left hand: Secondary | ICD-10-CM | POA: Diagnosis not present

## 2017-10-11 DIAGNOSIS — M25532 Pain in left wrist: Secondary | ICD-10-CM | POA: Diagnosis not present

## 2017-10-11 DIAGNOSIS — S62102A Fracture of unspecified carpal bone, left wrist, initial encounter for closed fracture: Secondary | ICD-10-CM | POA: Diagnosis not present

## 2017-10-25 DIAGNOSIS — S62102A Fracture of unspecified carpal bone, left wrist, initial encounter for closed fracture: Secondary | ICD-10-CM | POA: Diagnosis not present

## 2017-11-08 DIAGNOSIS — M25532 Pain in left wrist: Secondary | ICD-10-CM | POA: Diagnosis not present

## 2017-11-08 DIAGNOSIS — S62102A Fracture of unspecified carpal bone, left wrist, initial encounter for closed fracture: Secondary | ICD-10-CM | POA: Diagnosis not present

## 2017-11-08 DIAGNOSIS — S52592D Other fractures of lower end of left radius, subsequent encounter for closed fracture with routine healing: Secondary | ICD-10-CM | POA: Diagnosis not present

## 2017-11-30 DIAGNOSIS — S59212S Salter-Harris Type I physeal fracture of lower end of radius, left arm, sequela: Secondary | ICD-10-CM | POA: Diagnosis not present

## 2017-11-30 DIAGNOSIS — M24832 Other specific joint derangements of left wrist, not elsewhere classified: Secondary | ICD-10-CM | POA: Diagnosis not present

## 2017-11-30 DIAGNOSIS — W01198S Fall on same level from slipping, tripping and stumbling with subsequent striking against other object, sequela: Secondary | ICD-10-CM | POA: Diagnosis not present

## 2017-11-30 DIAGNOSIS — M25532 Pain in left wrist: Secondary | ICD-10-CM | POA: Diagnosis not present

## 2017-12-01 DIAGNOSIS — W01198S Fall on same level from slipping, tripping and stumbling with subsequent striking against other object, sequela: Secondary | ICD-10-CM | POA: Diagnosis not present

## 2017-12-01 DIAGNOSIS — M25532 Pain in left wrist: Secondary | ICD-10-CM | POA: Diagnosis not present

## 2017-12-01 DIAGNOSIS — M24832 Other specific joint derangements of left wrist, not elsewhere classified: Secondary | ICD-10-CM | POA: Diagnosis not present

## 2017-12-01 DIAGNOSIS — S59212S Salter-Harris Type I physeal fracture of lower end of radius, left arm, sequela: Secondary | ICD-10-CM | POA: Diagnosis not present

## 2017-12-01 DIAGNOSIS — S62102A Fracture of unspecified carpal bone, left wrist, initial encounter for closed fracture: Secondary | ICD-10-CM | POA: Diagnosis not present

## 2017-12-05 DIAGNOSIS — M25532 Pain in left wrist: Secondary | ICD-10-CM | POA: Diagnosis not present

## 2017-12-05 DIAGNOSIS — S59212S Salter-Harris Type I physeal fracture of lower end of radius, left arm, sequela: Secondary | ICD-10-CM | POA: Diagnosis not present

## 2017-12-05 DIAGNOSIS — W01198S Fall on same level from slipping, tripping and stumbling with subsequent striking against other object, sequela: Secondary | ICD-10-CM | POA: Diagnosis not present

## 2017-12-05 DIAGNOSIS — M24832 Other specific joint derangements of left wrist, not elsewhere classified: Secondary | ICD-10-CM | POA: Diagnosis not present

## 2017-12-07 DIAGNOSIS — S59212S Salter-Harris Type I physeal fracture of lower end of radius, left arm, sequela: Secondary | ICD-10-CM | POA: Diagnosis not present

## 2017-12-07 DIAGNOSIS — M25532 Pain in left wrist: Secondary | ICD-10-CM | POA: Diagnosis not present

## 2017-12-07 DIAGNOSIS — W01198S Fall on same level from slipping, tripping and stumbling with subsequent striking against other object, sequela: Secondary | ICD-10-CM | POA: Diagnosis not present

## 2017-12-07 DIAGNOSIS — M24832 Other specific joint derangements of left wrist, not elsewhere classified: Secondary | ICD-10-CM | POA: Diagnosis not present

## 2017-12-11 DIAGNOSIS — M24832 Other specific joint derangements of left wrist, not elsewhere classified: Secondary | ICD-10-CM | POA: Diagnosis not present

## 2017-12-11 DIAGNOSIS — L821 Other seborrheic keratosis: Secondary | ICD-10-CM | POA: Diagnosis not present

## 2017-12-11 DIAGNOSIS — L300 Nummular dermatitis: Secondary | ICD-10-CM | POA: Diagnosis not present

## 2017-12-11 DIAGNOSIS — C44722 Squamous cell carcinoma of skin of right lower limb, including hip: Secondary | ICD-10-CM | POA: Diagnosis not present

## 2017-12-11 DIAGNOSIS — M25532 Pain in left wrist: Secondary | ICD-10-CM | POA: Diagnosis not present

## 2017-12-11 DIAGNOSIS — S59212S Salter-Harris Type I physeal fracture of lower end of radius, left arm, sequela: Secondary | ICD-10-CM | POA: Diagnosis not present

## 2017-12-11 DIAGNOSIS — W01198S Fall on same level from slipping, tripping and stumbling with subsequent striking against other object, sequela: Secondary | ICD-10-CM | POA: Diagnosis not present

## 2017-12-11 DIAGNOSIS — Z85828 Personal history of other malignant neoplasm of skin: Secondary | ICD-10-CM | POA: Diagnosis not present

## 2017-12-11 DIAGNOSIS — D485 Neoplasm of uncertain behavior of skin: Secondary | ICD-10-CM | POA: Diagnosis not present

## 2017-12-11 DIAGNOSIS — L570 Actinic keratosis: Secondary | ICD-10-CM | POA: Diagnosis not present

## 2017-12-13 DIAGNOSIS — S59212S Salter-Harris Type I physeal fracture of lower end of radius, left arm, sequela: Secondary | ICD-10-CM | POA: Diagnosis not present

## 2017-12-13 DIAGNOSIS — W01198S Fall on same level from slipping, tripping and stumbling with subsequent striking against other object, sequela: Secondary | ICD-10-CM | POA: Diagnosis not present

## 2017-12-13 DIAGNOSIS — M24832 Other specific joint derangements of left wrist, not elsewhere classified: Secondary | ICD-10-CM | POA: Diagnosis not present

## 2017-12-13 DIAGNOSIS — M25532 Pain in left wrist: Secondary | ICD-10-CM | POA: Diagnosis not present

## 2017-12-15 DIAGNOSIS — W01198S Fall on same level from slipping, tripping and stumbling with subsequent striking against other object, sequela: Secondary | ICD-10-CM | POA: Diagnosis not present

## 2017-12-15 DIAGNOSIS — S59212S Salter-Harris Type I physeal fracture of lower end of radius, left arm, sequela: Secondary | ICD-10-CM | POA: Diagnosis not present

## 2017-12-15 DIAGNOSIS — M24832 Other specific joint derangements of left wrist, not elsewhere classified: Secondary | ICD-10-CM | POA: Diagnosis not present

## 2017-12-15 DIAGNOSIS — M25532 Pain in left wrist: Secondary | ICD-10-CM | POA: Diagnosis not present

## 2017-12-17 DIAGNOSIS — H5711 Ocular pain, right eye: Secondary | ICD-10-CM | POA: Diagnosis not present

## 2017-12-17 DIAGNOSIS — H1031 Unspecified acute conjunctivitis, right eye: Secondary | ICD-10-CM | POA: Diagnosis not present

## 2017-12-17 DIAGNOSIS — T1502XA Foreign body in cornea, left eye, initial encounter: Secondary | ICD-10-CM | POA: Diagnosis not present

## 2017-12-17 DIAGNOSIS — S0501XA Injury of conjunctiva and corneal abrasion without foreign body, right eye, initial encounter: Secondary | ICD-10-CM | POA: Diagnosis not present

## 2017-12-18 DIAGNOSIS — M24832 Other specific joint derangements of left wrist, not elsewhere classified: Secondary | ICD-10-CM | POA: Diagnosis not present

## 2017-12-18 DIAGNOSIS — W01198S Fall on same level from slipping, tripping and stumbling with subsequent striking against other object, sequela: Secondary | ICD-10-CM | POA: Diagnosis not present

## 2017-12-18 DIAGNOSIS — S59212S Salter-Harris Type I physeal fracture of lower end of radius, left arm, sequela: Secondary | ICD-10-CM | POA: Diagnosis not present

## 2017-12-18 DIAGNOSIS — M25532 Pain in left wrist: Secondary | ICD-10-CM | POA: Diagnosis not present

## 2017-12-19 DIAGNOSIS — S59212S Salter-Harris Type I physeal fracture of lower end of radius, left arm, sequela: Secondary | ICD-10-CM | POA: Diagnosis not present

## 2017-12-19 DIAGNOSIS — M25532 Pain in left wrist: Secondary | ICD-10-CM | POA: Diagnosis not present

## 2017-12-19 DIAGNOSIS — M24832 Other specific joint derangements of left wrist, not elsewhere classified: Secondary | ICD-10-CM | POA: Diagnosis not present

## 2017-12-19 DIAGNOSIS — W01198S Fall on same level from slipping, tripping and stumbling with subsequent striking against other object, sequela: Secondary | ICD-10-CM | POA: Diagnosis not present

## 2017-12-20 DIAGNOSIS — H52203 Unspecified astigmatism, bilateral: Secondary | ICD-10-CM | POA: Diagnosis not present

## 2017-12-20 DIAGNOSIS — Z961 Presence of intraocular lens: Secondary | ICD-10-CM | POA: Diagnosis not present

## 2017-12-20 DIAGNOSIS — H04123 Dry eye syndrome of bilateral lacrimal glands: Secondary | ICD-10-CM | POA: Diagnosis not present

## 2017-12-21 DIAGNOSIS — S59212S Salter-Harris Type I physeal fracture of lower end of radius, left arm, sequela: Secondary | ICD-10-CM | POA: Diagnosis not present

## 2017-12-21 DIAGNOSIS — M24832 Other specific joint derangements of left wrist, not elsewhere classified: Secondary | ICD-10-CM | POA: Diagnosis not present

## 2017-12-21 DIAGNOSIS — M25532 Pain in left wrist: Secondary | ICD-10-CM | POA: Diagnosis not present

## 2017-12-21 DIAGNOSIS — W01198S Fall on same level from slipping, tripping and stumbling with subsequent striking against other object, sequela: Secondary | ICD-10-CM | POA: Diagnosis not present

## 2017-12-22 ENCOUNTER — Non-Acute Institutional Stay: Payer: Medicare Other | Admitting: Adult Health

## 2017-12-22 DIAGNOSIS — R269 Unspecified abnormalities of gait and mobility: Secondary | ICD-10-CM | POA: Diagnosis not present

## 2017-12-22 DIAGNOSIS — M24832 Other specific joint derangements of left wrist, not elsewhere classified: Secondary | ICD-10-CM | POA: Diagnosis not present

## 2017-12-22 DIAGNOSIS — I10 Essential (primary) hypertension: Secondary | ICD-10-CM

## 2017-12-22 DIAGNOSIS — R488 Other symbolic dysfunctions: Secondary | ICD-10-CM | POA: Diagnosis not present

## 2017-12-22 DIAGNOSIS — F0281 Dementia in other diseases classified elsewhere with behavioral disturbance: Secondary | ICD-10-CM

## 2017-12-22 DIAGNOSIS — W01198S Fall on same level from slipping, tripping and stumbling with subsequent striking against other object, sequela: Secondary | ICD-10-CM | POA: Diagnosis not present

## 2017-12-22 DIAGNOSIS — I482 Chronic atrial fibrillation, unspecified: Secondary | ICD-10-CM

## 2017-12-22 DIAGNOSIS — G301 Alzheimer's disease with late onset: Secondary | ICD-10-CM

## 2017-12-22 DIAGNOSIS — M25532 Pain in left wrist: Secondary | ICD-10-CM | POA: Diagnosis not present

## 2017-12-22 DIAGNOSIS — S59212S Salter-Harris Type I physeal fracture of lower end of radius, left arm, sequela: Secondary | ICD-10-CM | POA: Diagnosis not present

## 2017-12-22 DIAGNOSIS — Z87440 Personal history of urinary (tract) infections: Secondary | ICD-10-CM | POA: Diagnosis not present

## 2017-12-22 DIAGNOSIS — E871 Hypo-osmolality and hyponatremia: Secondary | ICD-10-CM | POA: Diagnosis not present

## 2017-12-22 NOTE — Progress Notes (Signed)
Location:  Occupational psychologist of Service:  ALF (13) Provider:   [name], ANP [place] [phone]  [name], DO  Patient Care Team: [name], DO as PCP - General (Geriatric Medicine) [name], MD as Consulting Physician (Cardiology) Community, Well [name], MD as Consulting Physician (Gastroenterology) [name], [name], MD as Consulting Physician (Dermatology) [name], MD as Consulting Physician (Cardiology) [name], MD as Consulting Physician (Ophthalmology) Zimmer, [name], NP as Nurse Practitioner (Internal Medicine) [name], MD as Consulting Physician (Gynecology)  Extended Emergency Contact Information Primary Emergency Contact: [facility] Address: [address]          [place], [place] [address] [name] of [place] Phone: [phone] Work Phone: [phone] Mobile Phone: [phone] Relation: Spouse  Code Status:  DNR Goals of care: Advanced Directive information Advanced Directives 09/28/2017  Does Patient Have a Medical Advance Directive? Yes  Type of Paramedic of [place];Living will;Out of facility DNR (pink MOST or yellow form)  Does patient want to make changes to medical advance directive? -  Copy of [place] in Chart? Yes  Would patient like information on creating a medical advance directive? -  Pre-existing out of facility DNR order (yellow form or pink MOST form) Yellow form placed in chart (order not valid for inpatient use)     Chief Complaint  Patient presents with  . Medical Management of Chronic Issues    HPI:  Pt is a 84 y.o. female seen today for medical management of chronic diseases. She currently resides in memory care. Her husband visits regularly and she attends activities  Left distal radius fx sustained 10/10/17 after a fall: OT working  with resident, continues to wear left wrist splint. She denies any pain.  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 She remains ambulatory, pleasant, and able to follow commands. Has not tolerated aricept in the past  Afib: rate controlled on lopressor and eliquis for CVA risk reduction  Gait abnormality: uses a walker for short distances and a wheelchair for long distances  Hx of recurrent UTI and takes trimethoprim for prevention  Hx of hyponatremia and takes salt tabs. No issues with edema or increased confusion.  Past Medical History:  Diagnosis Date  . Actinic keratoses   . Allergy   . Alzheimer disease 05/21/2015  . Anxiety   . Arrhythmia    atrial fib  . Atrial fibrillation ([place]) 12/2012  . Basal cell carcinoma of skin   . Cataracts, both eyes 2005, 2013   Cataract extraction/IOL implant  . Current use of long term anticoagulation 12/2012   Eliquis  . Depression   . Diverticulosis 2008  . Fracture of scaphoid bone of hand 08/05/2004  . History of UTI   . Hyperlipidemia   . Hypertension   . Hyponatremia    SIADH  . Low sodium levels   . Mild cognitive impairment   . Osteopenia   . Rosacea   . Squamous cell carcinoma   . Thyroid nodule    Past Surgical History:  Procedure Laterality Date  . ABDOMINAL HYSTERECTOMY  1970   TAH-BSO  . cataracts Bilateral 2/05 and 3/05  . CESAREAN SECTION     questionable    Allergies  Allergen Reactions  . Solifenacin Other (See Comments)  . Ciprofloxacin Other (See Comments)  Reaction unknown  . Latex Other (See Comments)    Reaction unknown  . Macrobid [Nitrofurantoin Macrocrystal] Nausea And Vomiting  . Macrodantin [Nitrofurantoin] Nausea Only    Upset stomach  . Other Other (See Comments)    Popcorn- Diverticulosis  . Strawberry Extract Other (See Comments)    Diverticulosis   . Tramadol Nausea Only    Outpatient  Encounter Medications as of 12/22/2017  Medication Sig  . acetaminophen (TYLENOL) 325 MG tablet Take 325 mg by mouth every 6 (six) hours as needed.  Marland Kitchen apixaban (ELIQUIS) 2.5 MG TABS tablet Take 1 tablet (2.5 mg total) by mouth 2 (two) times daily.  . Calcium Carbonate-Vitamin D (CALCIUM 600+D PO) Take by mouth daily.  . cholecalciferol (VITAMIN D) 1000 UNITS tablet Take 2,000 Units by mouth daily.  . metoprolol succinate (TOPROL-XL) 25 MG 24 hr tablet Take 12.5 mg by mouth daily.  . mirtazapine (REMERON) 15 MG tablet Take 15 mg at bedtime by mouth.  . Multiple Vitamin (MULTIVITAMIN) tablet Take 1 tablet by mouth daily.  . polyethylene glycol (MIRALAX / GLYCOLAX) packet Take 17 g by mouth every other day.  . Probiotic Product (PROBIOTIC PO) Take 1 capsule by mouth every morning.  . sodium chloride 1 G tablet Take 1 g by mouth 2 (two) times daily with a meal.  . trimethoprim (TRIMPEX) 100 MG tablet Take 100 mg by mouth daily.   No facility-administered encounter medications on file as of 12/22/2017.     Review of Systems  Constitutional: Negative for chills, diaphoresis, fever, malaise/fatigue and weight loss.  Respiratory: Negative for cough, hemoptysis, sputum production and wheezing.   Cardiovascular: Negative for chest pain, leg swelling and PND.  Gastrointestinal: Negative for abdominal pain, constipation, diarrhea, nausea and vomiting.  Genitourinary: Negative for dysuria and urgency.  Musculoskeletal: Positive for falls and joint pain. Negative for back pain, myalgias and neck pain.  Skin: Negative for itching and rash.  Neurological: Negative for focal weakness, loss of consciousness and weakness.  Psychiatric/Behavioral: Positive for memory loss. Negative for depression. The patient does not have insomnia.      Immunization History  Administered Date(s) Administered  . H1N1 12/04/2008  . Influenza-Unspecified 09/15/2003, 11/01/2010, 09/12/2011, 07/22/2016, 09/11/2017  .  Pneumococcal Conjugate-13 04/14/2017  . Pneumococcal Polysaccharide-23 12/19/2008  . Td 09/15/2003, 01/06/2016   Pertinent  Health Maintenance Due  Topic Date Due  . INFLUENZA VACCINE  Completed  . DEXA SCAN  Completed  . PNA vac Low Risk Adult  Completed   Fall Risk  07/12/2017 06/21/2017 05/04/2017 03/08/2017 02/02/2017  Falls in the past year? No No No No No   Functional Status Survey:    Vitals:   12/29/17 0852  Weight: 117 lb 6.4 oz (53.3 kg)   Body mass index is 20.8 kg/m. Physical Exam  Constitutional: No distress.  HENT:  Head: Normocephalic and atraumatic.  Neck: No JVD present. No thyromegaly present.  Cardiovascular: Normal rate.  No murmur heard. irregular  Pulmonary/Chest: Effort normal and breath sounds normal. No respiratory distress. She has no wheezes.  Abdominal: Soft. Bowel sounds are normal.  Musculoskeletal: She exhibits no edema, tenderness or deformity.  Wearing brace to left wrist with no bruising, edema or tenderness  Lymphadenopathy:    She has no cervical adenopathy.  Neurological: She is alert.  Oriented to self only, able to f/c  Skin: Skin is warm and dry. She is not diaphoretic.  Psychiatric: She has a normal mood and affect.   Labs reviewed: Recent Labs  04/14/17 1224 12/25/17 0600  NA 131* 136*  K 4.9 4.7  CL 96*  --   CO2 26  --   GLUCOSE 89  --   BUN 20 21  CREATININE 0.85 0.9  CALCIUM 9.3  --    Recent Labs    04/14/17 1224  AST 18  ALT 11  ALKPHOS 53  BILITOT 0.6  PROT 6.5  ALBUMIN 3.9   Recent Labs    04/14/17 1224 07/17/17 12/25/17 0600  WBC 6.6 6.1 5.9  NEUTROABS 4,224  --   --   HGB 13.8 13.0 13.5  HCT 38.1 38 42  MCV 93.6  --   --   PLT 205 190 220   Lab Results  Component Value Date   TSH 1.64 12/25/2017   No results found for: HGBA1C No results found for: CHOL, HDL, LDLCALC, LDLDIRECT, TRIG, CHOLHDL  Significant Diagnostic Results in last 30 days:  No results found.  Assessment/Plan  Atrial  fibrillation Rate controlled with toprol xl, continues on eliquis for CVA risk reduction  Hypertension Review of BPs in matrix records at [facility] shows controlled numbers. Goal <150/90 due to age and fall risk  Late onset Alzheimer's disease with behavioral disturbance Progressive decline in cognition and function Seems to have adjusted well to the memory care unit  Hyponatremia Check BMP. She currently takes sodium 1 gram BID.  History of recurrent UTIs No new events, takes trimethoprim daily  Gait abnormality Due to progressive weakness and dementia. Uses a walker for short distances and a wheelchair for long distances.   Family/ staff Communication: discussed with resident and staff  Labs: CBC BMP TSH

## 2017-12-25 DIAGNOSIS — F0281 Dementia in other diseases classified elsewhere with behavioral disturbance: Secondary | ICD-10-CM | POA: Diagnosis not present

## 2017-12-25 DIAGNOSIS — D649 Anemia, unspecified: Secondary | ICD-10-CM | POA: Diagnosis not present

## 2017-12-25 DIAGNOSIS — E871 Hypo-osmolality and hyponatremia: Secondary | ICD-10-CM | POA: Diagnosis not present

## 2017-12-25 DIAGNOSIS — E039 Hypothyroidism, unspecified: Secondary | ICD-10-CM | POA: Diagnosis not present

## 2017-12-25 DIAGNOSIS — M25532 Pain in left wrist: Secondary | ICD-10-CM | POA: Diagnosis not present

## 2017-12-25 DIAGNOSIS — I10 Essential (primary) hypertension: Secondary | ICD-10-CM | POA: Diagnosis not present

## 2017-12-25 DIAGNOSIS — G301 Alzheimer's disease with late onset: Secondary | ICD-10-CM | POA: Diagnosis not present

## 2017-12-25 DIAGNOSIS — R488 Other symbolic dysfunctions: Secondary | ICD-10-CM | POA: Diagnosis not present

## 2017-12-25 DIAGNOSIS — M24832 Other specific joint derangements of left wrist, not elsewhere classified: Secondary | ICD-10-CM | POA: Diagnosis not present

## 2017-12-25 DIAGNOSIS — S59212S Salter-Harris Type I physeal fracture of lower end of radius, left arm, sequela: Secondary | ICD-10-CM | POA: Diagnosis not present

## 2017-12-25 LAB — TSH: TSH: 1.64 (ref 0.41–5.90)

## 2017-12-25 LAB — CBC AND DIFFERENTIAL
HCT: 42 (ref 36–46)
Hemoglobin: 13.5 (ref 12.0–16.0)
Platelets: 220 (ref 150–399)
WBC: 5.9

## 2017-12-25 LAB — BASIC METABOLIC PANEL
BUN: 21 (ref 4–21)
Creatinine: 0.9 (ref 0.5–1.1)
Glucose: 66
Potassium: 4.7 (ref 3.4–5.3)
Sodium: 136 — AB (ref 137–147)

## 2017-12-26 DIAGNOSIS — S59212S Salter-Harris Type I physeal fracture of lower end of radius, left arm, sequela: Secondary | ICD-10-CM | POA: Diagnosis not present

## 2017-12-26 DIAGNOSIS — I10 Essential (primary) hypertension: Secondary | ICD-10-CM | POA: Diagnosis not present

## 2017-12-26 DIAGNOSIS — E039 Hypothyroidism, unspecified: Secondary | ICD-10-CM | POA: Diagnosis not present

## 2017-12-26 DIAGNOSIS — M25532 Pain in left wrist: Secondary | ICD-10-CM | POA: Diagnosis not present

## 2017-12-26 DIAGNOSIS — F0281 Dementia in other diseases classified elsewhere with behavioral disturbance: Secondary | ICD-10-CM | POA: Diagnosis not present

## 2017-12-26 DIAGNOSIS — D649 Anemia, unspecified: Secondary | ICD-10-CM | POA: Diagnosis not present

## 2017-12-26 DIAGNOSIS — R488 Other symbolic dysfunctions: Secondary | ICD-10-CM | POA: Diagnosis not present

## 2017-12-26 DIAGNOSIS — G301 Alzheimer's disease with late onset: Secondary | ICD-10-CM | POA: Diagnosis not present

## 2017-12-26 DIAGNOSIS — M24832 Other specific joint derangements of left wrist, not elsewhere classified: Secondary | ICD-10-CM | POA: Diagnosis not present

## 2017-12-27 ENCOUNTER — Encounter: Payer: Self-pay | Admitting: Internal Medicine

## 2017-12-28 DIAGNOSIS — R488 Other symbolic dysfunctions: Secondary | ICD-10-CM | POA: Diagnosis not present

## 2017-12-28 DIAGNOSIS — M25532 Pain in left wrist: Secondary | ICD-10-CM | POA: Diagnosis not present

## 2017-12-28 DIAGNOSIS — F0281 Dementia in other diseases classified elsewhere with behavioral disturbance: Secondary | ICD-10-CM | POA: Diagnosis not present

## 2017-12-28 DIAGNOSIS — M24832 Other specific joint derangements of left wrist, not elsewhere classified: Secondary | ICD-10-CM | POA: Diagnosis not present

## 2017-12-28 DIAGNOSIS — S59212S Salter-Harris Type I physeal fracture of lower end of radius, left arm, sequela: Secondary | ICD-10-CM | POA: Diagnosis not present

## 2017-12-28 DIAGNOSIS — G301 Alzheimer's disease with late onset: Secondary | ICD-10-CM | POA: Diagnosis not present

## 2017-12-29 ENCOUNTER — Encounter: Payer: Self-pay | Admitting: Adult Health

## 2017-12-29 DIAGNOSIS — R269 Unspecified abnormalities of gait and mobility: Secondary | ICD-10-CM | POA: Insufficient documentation

## 2017-12-29 DIAGNOSIS — S59212S Salter-Harris Type I physeal fracture of lower end of radius, left arm, sequela: Secondary | ICD-10-CM | POA: Diagnosis not present

## 2017-12-29 DIAGNOSIS — M25532 Pain in left wrist: Secondary | ICD-10-CM | POA: Diagnosis not present

## 2017-12-29 DIAGNOSIS — G301 Alzheimer's disease with late onset: Secondary | ICD-10-CM | POA: Diagnosis not present

## 2017-12-29 DIAGNOSIS — F0281 Dementia in other diseases classified elsewhere with behavioral disturbance: Secondary | ICD-10-CM | POA: Diagnosis not present

## 2017-12-29 DIAGNOSIS — M24832 Other specific joint derangements of left wrist, not elsewhere classified: Secondary | ICD-10-CM | POA: Diagnosis not present

## 2017-12-29 DIAGNOSIS — R488 Other symbolic dysfunctions: Secondary | ICD-10-CM | POA: Diagnosis not present

## 2017-12-29 NOTE — Assessment & Plan Note (Signed)
Progressive decline in cognition and function Seems to have adjusted well to the memory care unit

## 2017-12-29 NOTE — Assessment & Plan Note (Signed)
Due to progressive weakness and dementia. Uses a walker for short distances and a wheelchair for long distances.

## 2017-12-29 NOTE — Assessment & Plan Note (Signed)
No new events, takes trimethoprim daily

## 2017-12-29 NOTE — Assessment & Plan Note (Signed)
Rate controlled with toprol xl, continues on eliquis for CVA risk reduction

## 2017-12-29 NOTE — Assessment & Plan Note (Signed)
Review of BPs in matrix records at [facility] shows controlled numbers. Goal <150/90 due to age and fall risk

## 2017-12-29 NOTE — Assessment & Plan Note (Signed)
Check BMP. She currently takes sodium 1 gram BID.

## 2018-01-01 DIAGNOSIS — M25532 Pain in left wrist: Secondary | ICD-10-CM | POA: Diagnosis not present

## 2018-01-01 DIAGNOSIS — F0281 Dementia in other diseases classified elsewhere with behavioral disturbance: Secondary | ICD-10-CM | POA: Diagnosis not present

## 2018-01-01 DIAGNOSIS — M24832 Other specific joint derangements of left wrist, not elsewhere classified: Secondary | ICD-10-CM | POA: Diagnosis not present

## 2018-01-01 DIAGNOSIS — S59212S Salter-Harris Type I physeal fracture of lower end of radius, left arm, sequela: Secondary | ICD-10-CM | POA: Diagnosis not present

## 2018-01-01 DIAGNOSIS — G301 Alzheimer's disease with late onset: Secondary | ICD-10-CM | POA: Diagnosis not present

## 2018-01-01 DIAGNOSIS — S62102A Fracture of unspecified carpal bone, left wrist, initial encounter for closed fracture: Secondary | ICD-10-CM | POA: Diagnosis not present

## 2018-01-01 DIAGNOSIS — R488 Other symbolic dysfunctions: Secondary | ICD-10-CM | POA: Diagnosis not present

## 2018-01-02 DIAGNOSIS — M24832 Other specific joint derangements of left wrist, not elsewhere classified: Secondary | ICD-10-CM | POA: Diagnosis not present

## 2018-01-02 DIAGNOSIS — S59212S Salter-Harris Type I physeal fracture of lower end of radius, left arm, sequela: Secondary | ICD-10-CM | POA: Diagnosis not present

## 2018-01-02 DIAGNOSIS — G301 Alzheimer's disease with late onset: Secondary | ICD-10-CM | POA: Diagnosis not present

## 2018-01-02 DIAGNOSIS — F0281 Dementia in other diseases classified elsewhere with behavioral disturbance: Secondary | ICD-10-CM | POA: Diagnosis not present

## 2018-01-02 DIAGNOSIS — R488 Other symbolic dysfunctions: Secondary | ICD-10-CM | POA: Diagnosis not present

## 2018-01-02 DIAGNOSIS — M25532 Pain in left wrist: Secondary | ICD-10-CM | POA: Diagnosis not present

## 2018-01-04 DIAGNOSIS — F0281 Dementia in other diseases classified elsewhere with behavioral disturbance: Secondary | ICD-10-CM | POA: Diagnosis not present

## 2018-01-04 DIAGNOSIS — G301 Alzheimer's disease with late onset: Secondary | ICD-10-CM | POA: Diagnosis not present

## 2018-01-04 DIAGNOSIS — M25532 Pain in left wrist: Secondary | ICD-10-CM | POA: Diagnosis not present

## 2018-01-04 DIAGNOSIS — S59212S Salter-Harris Type I physeal fracture of lower end of radius, left arm, sequela: Secondary | ICD-10-CM | POA: Diagnosis not present

## 2018-01-04 DIAGNOSIS — R488 Other symbolic dysfunctions: Secondary | ICD-10-CM | POA: Diagnosis not present

## 2018-01-04 DIAGNOSIS — M24832 Other specific joint derangements of left wrist, not elsewhere classified: Secondary | ICD-10-CM | POA: Diagnosis not present

## 2018-01-05 DIAGNOSIS — S59212S Salter-Harris Type I physeal fracture of lower end of radius, left arm, sequela: Secondary | ICD-10-CM | POA: Diagnosis not present

## 2018-01-05 DIAGNOSIS — G301 Alzheimer's disease with late onset: Secondary | ICD-10-CM | POA: Diagnosis not present

## 2018-01-05 DIAGNOSIS — M24832 Other specific joint derangements of left wrist, not elsewhere classified: Secondary | ICD-10-CM | POA: Diagnosis not present

## 2018-01-05 DIAGNOSIS — R488 Other symbolic dysfunctions: Secondary | ICD-10-CM | POA: Diagnosis not present

## 2018-01-05 DIAGNOSIS — F0281 Dementia in other diseases classified elsewhere with behavioral disturbance: Secondary | ICD-10-CM | POA: Diagnosis not present

## 2018-01-05 DIAGNOSIS — M25532 Pain in left wrist: Secondary | ICD-10-CM | POA: Diagnosis not present

## 2018-01-08 DIAGNOSIS — S59212S Salter-Harris Type I physeal fracture of lower end of radius, left arm, sequela: Secondary | ICD-10-CM | POA: Diagnosis not present

## 2018-01-08 DIAGNOSIS — R488 Other symbolic dysfunctions: Secondary | ICD-10-CM | POA: Diagnosis not present

## 2018-01-08 DIAGNOSIS — G301 Alzheimer's disease with late onset: Secondary | ICD-10-CM | POA: Diagnosis not present

## 2018-01-08 DIAGNOSIS — M24832 Other specific joint derangements of left wrist, not elsewhere classified: Secondary | ICD-10-CM | POA: Diagnosis not present

## 2018-01-08 DIAGNOSIS — M25532 Pain in left wrist: Secondary | ICD-10-CM | POA: Diagnosis not present

## 2018-01-08 DIAGNOSIS — F0281 Dementia in other diseases classified elsewhere with behavioral disturbance: Secondary | ICD-10-CM | POA: Diagnosis not present

## 2018-01-09 DIAGNOSIS — M24832 Other specific joint derangements of left wrist, not elsewhere classified: Secondary | ICD-10-CM | POA: Diagnosis not present

## 2018-01-09 DIAGNOSIS — M25532 Pain in left wrist: Secondary | ICD-10-CM | POA: Diagnosis not present

## 2018-01-09 DIAGNOSIS — F0281 Dementia in other diseases classified elsewhere with behavioral disturbance: Secondary | ICD-10-CM | POA: Diagnosis not present

## 2018-01-09 DIAGNOSIS — R488 Other symbolic dysfunctions: Secondary | ICD-10-CM | POA: Diagnosis not present

## 2018-01-09 DIAGNOSIS — G301 Alzheimer's disease with late onset: Secondary | ICD-10-CM | POA: Diagnosis not present

## 2018-01-09 DIAGNOSIS — S59212S Salter-Harris Type I physeal fracture of lower end of radius, left arm, sequela: Secondary | ICD-10-CM | POA: Diagnosis not present

## 2018-01-10 DIAGNOSIS — S59212S Salter-Harris Type I physeal fracture of lower end of radius, left arm, sequela: Secondary | ICD-10-CM | POA: Diagnosis not present

## 2018-01-10 DIAGNOSIS — M24832 Other specific joint derangements of left wrist, not elsewhere classified: Secondary | ICD-10-CM | POA: Diagnosis not present

## 2018-01-10 DIAGNOSIS — M25532 Pain in left wrist: Secondary | ICD-10-CM | POA: Diagnosis not present

## 2018-01-10 DIAGNOSIS — R488 Other symbolic dysfunctions: Secondary | ICD-10-CM | POA: Diagnosis not present

## 2018-01-10 DIAGNOSIS — G301 Alzheimer's disease with late onset: Secondary | ICD-10-CM | POA: Diagnosis not present

## 2018-01-10 DIAGNOSIS — F0281 Dementia in other diseases classified elsewhere with behavioral disturbance: Secondary | ICD-10-CM | POA: Diagnosis not present

## 2018-01-11 DIAGNOSIS — M25532 Pain in left wrist: Secondary | ICD-10-CM | POA: Diagnosis not present

## 2018-01-11 DIAGNOSIS — R488 Other symbolic dysfunctions: Secondary | ICD-10-CM | POA: Diagnosis not present

## 2018-01-11 DIAGNOSIS — M24832 Other specific joint derangements of left wrist, not elsewhere classified: Secondary | ICD-10-CM | POA: Diagnosis not present

## 2018-01-11 DIAGNOSIS — S59212S Salter-Harris Type I physeal fracture of lower end of radius, left arm, sequela: Secondary | ICD-10-CM | POA: Diagnosis not present

## 2018-01-11 DIAGNOSIS — F0281 Dementia in other diseases classified elsewhere with behavioral disturbance: Secondary | ICD-10-CM | POA: Diagnosis not present

## 2018-01-11 DIAGNOSIS — G301 Alzheimer's disease with late onset: Secondary | ICD-10-CM | POA: Diagnosis not present

## 2018-01-15 DIAGNOSIS — G301 Alzheimer's disease with late onset: Secondary | ICD-10-CM | POA: Diagnosis not present

## 2018-01-15 DIAGNOSIS — R488 Other symbolic dysfunctions: Secondary | ICD-10-CM | POA: Diagnosis not present

## 2018-01-15 DIAGNOSIS — M25532 Pain in left wrist: Secondary | ICD-10-CM | POA: Diagnosis not present

## 2018-01-15 DIAGNOSIS — F0281 Dementia in other diseases classified elsewhere with behavioral disturbance: Secondary | ICD-10-CM | POA: Diagnosis not present

## 2018-01-15 DIAGNOSIS — M24832 Other specific joint derangements of left wrist, not elsewhere classified: Secondary | ICD-10-CM | POA: Diagnosis not present

## 2018-01-15 DIAGNOSIS — S59212S Salter-Harris Type I physeal fracture of lower end of radius, left arm, sequela: Secondary | ICD-10-CM | POA: Diagnosis not present

## 2018-01-16 DIAGNOSIS — M25532 Pain in left wrist: Secondary | ICD-10-CM | POA: Diagnosis not present

## 2018-01-16 DIAGNOSIS — R488 Other symbolic dysfunctions: Secondary | ICD-10-CM | POA: Diagnosis not present

## 2018-01-16 DIAGNOSIS — M24832 Other specific joint derangements of left wrist, not elsewhere classified: Secondary | ICD-10-CM | POA: Diagnosis not present

## 2018-01-16 DIAGNOSIS — S59212S Salter-Harris Type I physeal fracture of lower end of radius, left arm, sequela: Secondary | ICD-10-CM | POA: Diagnosis not present

## 2018-01-16 DIAGNOSIS — G301 Alzheimer's disease with late onset: Secondary | ICD-10-CM | POA: Diagnosis not present

## 2018-01-16 DIAGNOSIS — F0281 Dementia in other diseases classified elsewhere with behavioral disturbance: Secondary | ICD-10-CM | POA: Diagnosis not present

## 2018-01-18 DIAGNOSIS — G301 Alzheimer's disease with late onset: Secondary | ICD-10-CM | POA: Diagnosis not present

## 2018-01-18 DIAGNOSIS — S59212S Salter-Harris Type I physeal fracture of lower end of radius, left arm, sequela: Secondary | ICD-10-CM | POA: Diagnosis not present

## 2018-01-18 DIAGNOSIS — R488 Other symbolic dysfunctions: Secondary | ICD-10-CM | POA: Diagnosis not present

## 2018-01-18 DIAGNOSIS — M25532 Pain in left wrist: Secondary | ICD-10-CM | POA: Diagnosis not present

## 2018-01-18 DIAGNOSIS — M24832 Other specific joint derangements of left wrist, not elsewhere classified: Secondary | ICD-10-CM | POA: Diagnosis not present

## 2018-01-18 DIAGNOSIS — F0281 Dementia in other diseases classified elsewhere with behavioral disturbance: Secondary | ICD-10-CM | POA: Diagnosis not present

## 2018-01-23 DIAGNOSIS — S59212S Salter-Harris Type I physeal fracture of lower end of radius, left arm, sequela: Secondary | ICD-10-CM | POA: Diagnosis not present

## 2018-01-23 DIAGNOSIS — W01198S Fall on same level from slipping, tripping and stumbling with subsequent striking against other object, sequela: Secondary | ICD-10-CM | POA: Diagnosis not present

## 2018-01-23 DIAGNOSIS — M25532 Pain in left wrist: Secondary | ICD-10-CM | POA: Diagnosis not present

## 2018-01-23 DIAGNOSIS — M24832 Other specific joint derangements of left wrist, not elsewhere classified: Secondary | ICD-10-CM | POA: Diagnosis not present

## 2018-01-24 DIAGNOSIS — M25532 Pain in left wrist: Secondary | ICD-10-CM | POA: Diagnosis not present

## 2018-01-24 DIAGNOSIS — M24832 Other specific joint derangements of left wrist, not elsewhere classified: Secondary | ICD-10-CM | POA: Diagnosis not present

## 2018-01-24 DIAGNOSIS — S59212S Salter-Harris Type I physeal fracture of lower end of radius, left arm, sequela: Secondary | ICD-10-CM | POA: Diagnosis not present

## 2018-01-24 DIAGNOSIS — W01198S Fall on same level from slipping, tripping and stumbling with subsequent striking against other object, sequela: Secondary | ICD-10-CM | POA: Diagnosis not present

## 2018-01-26 DIAGNOSIS — S59212S Salter-Harris Type I physeal fracture of lower end of radius, left arm, sequela: Secondary | ICD-10-CM | POA: Diagnosis not present

## 2018-01-26 DIAGNOSIS — M25532 Pain in left wrist: Secondary | ICD-10-CM | POA: Diagnosis not present

## 2018-01-26 DIAGNOSIS — W01198S Fall on same level from slipping, tripping and stumbling with subsequent striking against other object, sequela: Secondary | ICD-10-CM | POA: Diagnosis not present

## 2018-01-26 DIAGNOSIS — M24832 Other specific joint derangements of left wrist, not elsewhere classified: Secondary | ICD-10-CM | POA: Diagnosis not present

## 2018-01-28 ENCOUNTER — Encounter: Payer: Self-pay | Admitting: Internal Medicine

## 2018-01-29 DIAGNOSIS — S59212S Salter-Harris Type I physeal fracture of lower end of radius, left arm, sequela: Secondary | ICD-10-CM | POA: Diagnosis not present

## 2018-01-29 DIAGNOSIS — M25532 Pain in left wrist: Secondary | ICD-10-CM | POA: Diagnosis not present

## 2018-01-29 DIAGNOSIS — M24832 Other specific joint derangements of left wrist, not elsewhere classified: Secondary | ICD-10-CM | POA: Diagnosis not present

## 2018-01-29 DIAGNOSIS — W01198S Fall on same level from slipping, tripping and stumbling with subsequent striking against other object, sequela: Secondary | ICD-10-CM | POA: Diagnosis not present

## 2018-01-31 DIAGNOSIS — W01198S Fall on same level from slipping, tripping and stumbling with subsequent striking against other object, sequela: Secondary | ICD-10-CM | POA: Diagnosis not present

## 2018-01-31 DIAGNOSIS — S59212S Salter-Harris Type I physeal fracture of lower end of radius, left arm, sequela: Secondary | ICD-10-CM | POA: Diagnosis not present

## 2018-01-31 DIAGNOSIS — M24832 Other specific joint derangements of left wrist, not elsewhere classified: Secondary | ICD-10-CM | POA: Diagnosis not present

## 2018-01-31 DIAGNOSIS — M25532 Pain in left wrist: Secondary | ICD-10-CM | POA: Diagnosis not present

## 2018-02-05 DIAGNOSIS — D649 Anemia, unspecified: Secondary | ICD-10-CM | POA: Diagnosis not present

## 2018-02-05 DIAGNOSIS — R1111 Vomiting without nausea: Secondary | ICD-10-CM | POA: Diagnosis not present

## 2018-02-05 DIAGNOSIS — E039 Hypothyroidism, unspecified: Secondary | ICD-10-CM | POA: Diagnosis not present

## 2018-02-06 ENCOUNTER — Encounter: Payer: Self-pay | Admitting: Internal Medicine

## 2018-02-06 ENCOUNTER — Non-Acute Institutional Stay: Payer: Medicare Other | Admitting: Internal Medicine

## 2018-02-06 DIAGNOSIS — M25532 Pain in left wrist: Secondary | ICD-10-CM | POA: Diagnosis not present

## 2018-02-06 DIAGNOSIS — W01198S Fall on same level from slipping, tripping and stumbling with subsequent striking against other object, sequela: Secondary | ICD-10-CM | POA: Diagnosis not present

## 2018-02-06 DIAGNOSIS — R112 Nausea with vomiting, unspecified: Secondary | ICD-10-CM | POA: Diagnosis not present

## 2018-02-06 DIAGNOSIS — R630 Anorexia: Secondary | ICD-10-CM

## 2018-02-06 DIAGNOSIS — S59212S Salter-Harris Type I physeal fracture of lower end of radius, left arm, sequela: Secondary | ICD-10-CM | POA: Diagnosis not present

## 2018-02-06 DIAGNOSIS — M24832 Other specific joint derangements of left wrist, not elsewhere classified: Secondary | ICD-10-CM | POA: Diagnosis not present

## 2018-02-06 DIAGNOSIS — Z79899 Other long term (current) drug therapy: Secondary | ICD-10-CM | POA: Diagnosis not present

## 2018-02-06 DIAGNOSIS — D649 Anemia, unspecified: Secondary | ICD-10-CM | POA: Diagnosis not present

## 2018-02-06 DIAGNOSIS — D72829 Elevated white blood cell count, unspecified: Secondary | ICD-10-CM | POA: Diagnosis not present

## 2018-02-06 DIAGNOSIS — R1084 Generalized abdominal pain: Secondary | ICD-10-CM

## 2018-02-06 DIAGNOSIS — N3000 Acute cystitis without hematuria: Secondary | ICD-10-CM

## 2018-02-06 DIAGNOSIS — R319 Hematuria, unspecified: Secondary | ICD-10-CM | POA: Diagnosis not present

## 2018-02-06 DIAGNOSIS — D72828 Other elevated white blood cell count: Secondary | ICD-10-CM | POA: Diagnosis not present

## 2018-02-06 DIAGNOSIS — N390 Urinary tract infection, site not specified: Secondary | ICD-10-CM | POA: Diagnosis not present

## 2018-02-06 LAB — CBC AND DIFFERENTIAL
HCT: 43 (ref 36–46)
Hemoglobin: 14.3 (ref 12.0–16.0)
Platelets: 220 (ref 150–399)
WBC: 11.1

## 2018-02-06 NOTE — Progress Notes (Signed)
Patient ID: Cathy Dickerson, female   DOB: 1927/09/23, 84 y.o.   MRN: [id number]  Location:  [facility] Room Number: 542 Memory care Place of Service:  ALF [phone]) Provider:   [name], DO  Patient Care Team: [name], DO as PCP - General (Geriatric Medicine) [name], MD as Consulting Physician (Cardiology) [facility], [facility] [name], MD as Consulting Physician (Gastroenterology) [name], [name], MD as Consulting Physician (Dermatology) [name], MD as Consulting Physician (Cardiology) [name], MD as Consulting Physician (Ophthalmology) Zimmer, [name], NP as Nurse Practitioner (Internal Medicine) [name], MD as Consulting Physician (Gynecology)  Extended Emergency Contact Information Primary Emergency Contact: Arciniega,[name] Address: [address]          [place], [place] [address] [name] of [place] Phone: [phone] Work Phone: [phone] Mobile Phone: [phone] Relation: Spouse  Code Status:  DNR Goals of care: Advanced Directive information Advanced Directives 02/06/2018  Does Patient Have a Medical Advance Directive? Yes  Type of Advance Directive Out of facility DNR (pink MOST or yellow form);[place];Living will  Does patient want to make changes to medical advance directive? No - Patient declined  Copy of [place] in Chart? Yes  Would patient like information on creating a medical advance directive? -  Pre-existing out of facility DNR order (yellow form or pink MOST form) Yellow form placed in chart (order not valid for inpatient use)     Chief Complaint  Patient presents with  . Acute Visit    abdominal pain    HPI:  Pt is a 84 y.o. female seen today for an acute visit for abdominal pain, elevated wbc count, decreased po intake, lethargy for past 48 hrs.  She underwent labs and a KUB  02/05/18 which showed no obstruction or explanation for pain.  Labs showed wbc 12.8  Yesterday but down to 11.1 today.  Amylase and lipase normal.  TSH 2/6 wnl.  She also had appeared hemoconcentrated with h/h 15/6/45.,6 yesterday but improved with pushed po fluids.  BUN/cr stable.  Ca had also been mildly elevated with first lab.  Intake was doing considerably better and resident had told staff she felt better earlier.  She was up and about, but when I saw her, she appeared flushed in the cheeks but otherwise pale and had diffuse abdominal tenderness.  UA had suggested infection.  Ucx still pending.  She's afebrile.  No cough or respiratory symptoms.  She admitted to tenderness of her entire abdomen and did not want me to keep pressing on it.     Past Medical History:  Diagnosis Date  . Actinic keratoses   . Allergy   . Alzheimer disease 05/21/2015  . Anxiety   . Arrhythmia    atrial fib  . Atrial fibrillation ([place]) 12/2012  . Basal cell carcinoma of skin   . Cataracts, both eyes 2005, 2013   Cataract extraction/IOL implant  . Current use of long term anticoagulation 12/2012   Eliquis  . Depression   . Diverticulosis 2008  . Fracture of scaphoid bone of hand 08/05/2004  . History of UTI   . Hyperlipidemia   . Hypertension   . Hyponatremia    SIADH  . Low sodium levels   . Mild cognitive impairment   . Osteopenia   . Rosacea   . Squamous cell carcinoma   . Thyroid nodule    Past Surgical History:  Procedure Laterality Date  . ABDOMINAL HYSTERECTOMY  1970   TAH-BSO  . cataracts Bilateral 2/05 and 3/05  . CESAREAN SECTION     questionable    Allergies  Allergen Reactions  . Solifenacin Other (See Comments)  . Ciprofloxacin Other (See Comments)    Reaction unknown  . Latex Other (See Comments)    Reaction unknown  . Macrobid [Nitrofurantoin Macrocrystal] Nausea And Vomiting  . Macrodantin [Nitrofurantoin] Nausea Only    Upset stomach  . Other Other (See Comments)     Popcorn- Diverticulosis  . Strawberry Extract Other (See Comments)    Diverticulosis   . Tramadol Nausea Only    Outpatient Encounter Medications as of 02/06/2018  Medication Sig  . acetaminophen (TYLENOL) 325 MG tablet Take 325 mg by mouth every 6 (six) hours as needed.  Marland Kitchen apixaban (ELIQUIS) 2.5 MG TABS tablet Take 1 tablet (2.5 mg total) by mouth 2 (two) times daily.  . Calcium Carbonate-Vitamin D (CALCIUM 600+D PO) Take by mouth daily.  . cholecalciferol (VITAMIN D) 1000 UNITS tablet Take 2,000 Units by mouth daily.  . magnesium hydroxide (MILK OF MAGNESIA) 400 MG/5ML suspension Take 20 mLs by mouth daily as needed for mild constipation.  . metoprolol succinate (TOPROL-XL) 25 MG 24 hr tablet Take 12.5 mg by mouth daily.  . mirtazapine (REMERON) 15 MG tablet Take 15 mg at bedtime by mouth.  . Multiple Vitamin (MULTIVITAMIN) tablet Take 1 tablet by mouth daily.  . polyethylene glycol (MIRALAX / GLYCOLAX) packet Take 17 g by mouth every other day.  . saccharomyces boulardii (FLORASTOR) 250 MG capsule Take 250 mg by mouth daily.  . sodium chloride 1 G tablet Take 1 g by mouth 2 (two) times daily with a meal.  . sulfamethoxazole-trimethoprim (BACTRIM DS,SEPTRA DS) 800-160 MG tablet Take 1 tablet by mouth daily.  Marland Kitchen trimethoprim (TRIMPEX) 100 MG tablet Take 100 mg by mouth daily.  . [DISCONTINUED] Probiotic Product (PROBIOTIC PO) Take 1 capsule by mouth every morning.   No facility-administered encounter medications on file as of 02/06/2018.     Review of Systems  Constitutional: Positive for activity change, appetite change and fatigue. Negative for fever.  HENT: Negative for congestion.   Respiratory: Negative for chest tightness and shortness of breath.   Cardiovascular: Negative for chest pain, palpitations and leg swelling.  Gastrointestinal: Positive for abdominal pain and constipation. Negative for abdominal distention, anal bleeding and blood in stool.       Bowels have moved which  seemed to improve symptoms some (abd pain)  Genitourinary: Positive for frequency and urgency. Negative for dysuria and hematuria.  Musculoskeletal: Positive for gait problem.  Neurological: Positive for weakness. Negative for dizziness.  Hematological: Bruises/bleeds easily.  Psychiatric/Behavioral: Positive for confusion. Negative for behavioral problems.    Immunization History  Administered Date(s) Administered  . H1N1 12/04/2008  . Influenza-Unspecified 09/15/2003, 11/01/2010, 09/12/2011, 07/22/2016, 09/11/2017  . Pneumococcal Conjugate-13 04/14/2017  . Pneumococcal Polysaccharide-23 12/19/2008  . Td 09/15/2003, 01/06/2016  . Zoster Recombinat (Shingrix) 08/21/2017, 11/21/2017   Pertinent  Health Maintenance Due  Topic Date Due  . INFLUENZA VACCINE  Completed  . DEXA SCAN  Completed  . PNA vac Low Risk Adult  Completed   Fall Risk  07/12/2017 06/21/2017 05/04/2017 03/08/2017 02/02/2017  Falls in the past year? No No No No No   Functional Status Survey:    Vitals:   02/06/18 1440  BP: (!) 126/55  Pulse: (!) 58  Resp: 16  Temp: (!) 97.5 F (36.4  C)  TempSrc: Oral  SpO2: 98%  Weight: 119 lb (54 kg)   Body mass index is 21.08 kg/m. Physical Exam  Constitutional:  Frail white female resting in bed  Cardiovascular: Normal rate, regular rhythm, normal heart sounds and intact distal pulses.  Pulmonary/Chest: Effort normal and breath sounds normal. No respiratory distress.  Abdominal: Soft. Bowel sounds are normal. She exhibits no distension and no mass. There is tenderness. There is no rebound and no guarding.  Musculoskeletal: Normal range of motion.  Neurological: She is alert.  Oriented to person only, keeps asking if she can stay here for the night; covered with 3 blankets which is usual for her  Skin: Skin is warm and dry.  flushed cheeks, pallor otherwise  Psychiatric: She has a normal mood and affect.    Labs reviewed: Recent Labs    04/14/17 1224  12/25/17 0600  NA 131* 136*  K 4.9 4.7  CL 96*  --   CO2 26  --   GLUCOSE 89  --   BUN 20 21  CREATININE 0.85 0.9  CALCIUM 9.3  --    Recent Labs    04/14/17 1224  AST 18  ALT 11  ALKPHOS 53  BILITOT 0.6  PROT 6.5  ALBUMIN 3.9   Recent Labs    04/14/17 1224 07/17/17 12/25/17 0600 02/06/18 0700  WBC 6.6 6.1 5.9 11.1  NEUTROABS 4,224  --   --   --   HGB 13.8 13.0 13.5 14.3  HCT 38.1 38 42 43  MCV 93.6  --   --   --   PLT 205 190 220 220   Lab Results  Component Value Date   TSH 1.64 12/25/2017   No results found for: HGBA1C No results found for: CHOL, HDL, LDLCALC, LDLDIRECT, TRIG, CHOLHDL  Significant Diagnostic Results in last 30 days:  Labs and kub reviewed in hpi, only some abstracted as above  Assessment/Plan 1. Acute cystitis without hematuria -seems to be cause of symptoms below -push po fluids -start bactrim DS for 7 days pending c+s  2. Generalized abdominal pain -ongoing, but apparently has not been persistent -tx UTI  3. Poor appetite -worse due to infection, encourage po especially fluids to help -would try not to use IV due to distress it will cause Kolina--suspect she will try to pull it out -monitor intake and symptoms for improvement and staff to let me know if she's not getting better  Family/ staff Communication: discussed with memory care nurse and nurse manager  Labs/tests ordered:  Await urine culture for susceptibility  Hiroshi Krummel L. Jo Booze, D.O. [facility] [address], [place] [address] Cell Phone (Mon-Fri 8am-5pm):  [phone] On Call:  [phone] & follow prompts after 5pm & weekends Office Phone:  [phone] Office Fax:  [phone]

## 2018-02-08 DIAGNOSIS — S59212S Salter-Harris Type I physeal fracture of lower end of radius, left arm, sequela: Secondary | ICD-10-CM | POA: Diagnosis not present

## 2018-02-08 DIAGNOSIS — M24832 Other specific joint derangements of left wrist, not elsewhere classified: Secondary | ICD-10-CM | POA: Diagnosis not present

## 2018-02-08 DIAGNOSIS — M25532 Pain in left wrist: Secondary | ICD-10-CM | POA: Diagnosis not present

## 2018-02-08 DIAGNOSIS — W01198S Fall on same level from slipping, tripping and stumbling with subsequent striking against other object, sequela: Secondary | ICD-10-CM | POA: Diagnosis not present

## 2018-02-12 DIAGNOSIS — S59212S Salter-Harris Type I physeal fracture of lower end of radius, left arm, sequela: Secondary | ICD-10-CM | POA: Diagnosis not present

## 2018-02-12 DIAGNOSIS — M25532 Pain in left wrist: Secondary | ICD-10-CM | POA: Diagnosis not present

## 2018-02-12 DIAGNOSIS — W01198S Fall on same level from slipping, tripping and stumbling with subsequent striking against other object, sequela: Secondary | ICD-10-CM | POA: Diagnosis not present

## 2018-02-12 DIAGNOSIS — M24832 Other specific joint derangements of left wrist, not elsewhere classified: Secondary | ICD-10-CM | POA: Diagnosis not present

## 2018-03-06 ENCOUNTER — Non-Acute Institutional Stay: Payer: Medicare Other | Admitting: Internal Medicine

## 2018-03-06 ENCOUNTER — Encounter: Payer: Self-pay | Admitting: Internal Medicine

## 2018-03-06 DIAGNOSIS — G301 Alzheimer's disease with late onset: Secondary | ICD-10-CM

## 2018-03-06 DIAGNOSIS — I482 Chronic atrial fibrillation, unspecified: Secondary | ICD-10-CM

## 2018-03-06 DIAGNOSIS — F0281 Dementia in other diseases classified elsewhere with behavioral disturbance: Secondary | ICD-10-CM

## 2018-03-06 DIAGNOSIS — N3000 Acute cystitis without hematuria: Secondary | ICD-10-CM | POA: Diagnosis not present

## 2018-03-06 DIAGNOSIS — F321 Major depressive disorder, single episode, moderate: Secondary | ICD-10-CM | POA: Diagnosis not present

## 2018-03-06 DIAGNOSIS — R630 Anorexia: Secondary | ICD-10-CM

## 2018-03-06 DIAGNOSIS — R269 Unspecified abnormalities of gait and mobility: Secondary | ICD-10-CM | POA: Diagnosis not present

## 2018-03-06 NOTE — Progress Notes (Signed)
Patient ID: Cathy Dickerson, female   DOB: [birth year], 83 y.o.   MRN: [id number]  Location:  [facility] Room Number: 030 Place of Service:  ALF [phone]) Provider:  Karen Kays, RN, DNP Student/ [name], DO  Patient Care Team: [name], DO as PCP - General (Geriatric Medicine) [name], MD as Consulting Physician (Cardiology) Community, [facility] [name], MD as Consulting Physician (Gastroenterology) [name], [name], MD as Consulting Physician (Dermatology) [name], MD as Consulting Physician (Cardiology) [name], MD as Consulting Physician (Ophthalmology) Zimmer, [name], NP as Nurse Practitioner (Internal Medicine) [name], MD as Consulting Physician (Gynecology)  Extended Emergency Contact Information Primary Emergency Contact: Heyde,[name] Address: [address]          [place], [place] [address] [name] of [place] Phone: [phone] Work Phone: [phone] Mobile Phone: [phone] Relation: Spouse  Code Status:  DNR Goals of care: Advanced Directive information Advanced Directives 03/06/2018  Does Patient Have a Medical Advance Directive? Yes  Type of Paramedic of [place];Living will;Out of facility DNR (pink MOST or yellow form)  Does patient want to make changes to medical advance directive? No - Patient declined  Copy of [place] in Chart? Yes  Would patient like information on creating a medical advance directive? -  Pre-existing out of facility DNR order (yellow form or pink MOST form) Yellow form placed in chart (order not valid for inpatient use)     Chief Complaint  Patient presents with  . Medical Management of Chronic Issues    Routine Visit    HPI:  Pt is a 84 y.o. female seen today for medical management of chronic diseases.   Cathy Dickerson was visited today in memory  care where she resides. She is pleasant and happy to have this visit. She is without complaint personally today. She was recently seen on 02/06/18 for acute cystitis without hematuria. Was started on bactrim for 7 days and encouraged to push fluids. She has completed this and reports feeling well and denies s/s of UTI. She is maintained on trimethoprim for prevention of UTIs.  Dementia: Her last MMSE being 7/30 as per previous notes. She continues to have progression of her memory with some good days and some bad. She believes her husband has passed away, but he lives in [facility]. She is unable to recall lunch which was an hour or so ago. She is unable to retain who I am and why I am there. She thinks we should go outside as it is so beautiful today. She enjoys when the dogs come to visit her also-she keeps treats in her room for them.   Afib: Rate controlled on Toprol and Eliquis for reduction in CVA risk. She does not have signs of bleeding today. She is denies feeling her heart race or SOB today on exam.   Depression: She is doing better since moving to [facility]. Her weight is still stable at 117lbs, she was 116 back in Aug of 2018. She is maintained on Remeron to help with mood and sleeping.  Per nursing: she is not sleeping well, she is not the best of eaters, but she is eating. She is not incontinent and is able to ambulate herself to the restroom with an all wheel seated walker. She has demonstrated and on going confusion. She has no skin issues.   Past Medical History:  Diagnosis Date  .  Actinic keratoses   . Allergy   . Alzheimer disease 05/21/2015  . Anxiety   . Arrhythmia    atrial fib  . Atrial fibrillation ([place]) 12/2012  . Basal cell carcinoma of skin   . Cataracts, both eyes 2005, 2013   Cataract extraction/IOL implant  . Current use of long term anticoagulation 12/2012   Eliquis  . Depression   . Diverticulosis 2008  . Fracture of scaphoid bone of hand 08/05/2004    . History of UTI   . Hyperlipidemia   . Hypertension   . Hyponatremia    SIADH  . Low sodium levels   . Mild cognitive impairment   . Osteopenia   . Rosacea   . Squamous cell carcinoma   . Thyroid nodule    Past Surgical History:  Procedure Laterality Date  . ABDOMINAL HYSTERECTOMY  1970   TAH-BSO  . cataracts Bilateral 2/05 and 3/05  . CESAREAN SECTION     questionable    Allergies  Allergen Reactions  . Solifenacin Other (See Comments)  . Ciprofloxacin Other (See Comments)    Reaction unknown  . Latex Other (See Comments)    Reaction unknown  . Macrobid [Nitrofurantoin Macrocrystal] Nausea And Vomiting  . Macrodantin [Nitrofurantoin] Nausea Only    Upset stomach  . Other Other (See Comments)    Popcorn- Diverticulosis  . Strawberry Extract Other (See Comments)    Diverticulosis   . Tramadol Nausea Only    Outpatient Encounter Medications as of 03/06/2018  Medication Sig  . acetaminophen (TYLENOL) 325 MG tablet Take 325 mg by mouth every 6 (six) hours as needed.  Marland Kitchen apixaban (ELIQUIS) 2.5 MG TABS tablet Take 1 tablet (2.5 mg total) by mouth 2 (two) times daily.  . Calcium Carbonate-Vitamin D (CALCIUM 600+D PO) Take by mouth daily.  . carboxymethylcellulose (REFRESH PLUS) 0.5 % SOLN Place 1 drop into both eyes every 3 (three) hours.  . cholecalciferol (VITAMIN D) 1000 UNITS tablet Take 2,000 Units by mouth daily.  . magnesium hydroxide (MILK OF MAGNESIA) 400 MG/5ML suspension Take 20 mLs by mouth daily as needed for mild constipation.  . metoprolol succinate (TOPROL-XL) 25 MG 24 hr tablet Take 12.5 mg by mouth daily.  . mirtazapine (REMERON) 15 MG tablet Take 15 mg at bedtime by mouth.  . Multiple Vitamin (MULTIVITAMIN) tablet Take 1 tablet by mouth daily.  . polyethylene glycol (MIRALAX / GLYCOLAX) packet Take 17 g by mouth every other day.  . saccharomyces boulardii (FLORASTOR) 250 MG capsule Take 250 mg by mouth daily.  . sodium chloride 1 G tablet Take 1 g by  mouth 2 (two) times daily with a meal.  . trimethoprim (TRIMPEX) 100 MG tablet Take 100 mg by mouth daily.  . [DISCONTINUED] sulfamethoxazole-trimethoprim (BACTRIM DS,SEPTRA DS) 800-160 MG tablet Take 1 tablet by mouth daily.   No facility-administered encounter medications on file as of 03/06/2018.     Review of Systems  Unable to perform ROS: Dementia (performed with nurse help)  Constitutional: Positive for appetite change. Negative for activity change and fatigue.  Eyes: Negative.   Respiratory: Negative for cough, chest tightness and shortness of breath.   Cardiovascular: Negative for chest pain, palpitations and leg swelling.  Gastrointestinal: Negative for abdominal pain, blood in stool, constipation and diarrhea.  Genitourinary: Negative for difficulty urinating, dyspareunia, dysuria, flank pain and hematuria.  Musculoskeletal: Positive for gait problem.       Uses a rolling seated walker  Neurological: Negative for dizziness and headaches.  Psychiatric/Behavioral: Positive for confusion, decreased concentration and sleep disturbance. Negative for agitation.    Immunization History  Administered Date(s) Administered  . H1N1 12/04/2008  . Influenza-Unspecified 09/15/2003, 11/01/2010, 09/12/2011, 07/22/2016, 09/11/2017  . Pneumococcal Conjugate-13 04/14/2017  . Pneumococcal Polysaccharide-23 12/19/2008  . Td 09/15/2003, 01/06/2016  . Zoster Recombinat (Shingrix) 08/21/2017, 11/21/2017   Pertinent  Health Maintenance Due  Topic Date Due  . INFLUENZA VACCINE  06/21/2018  . DEXA SCAN  Completed  . PNA vac Low Risk Adult  Completed   Fall Risk  07/12/2017 06/21/2017 05/04/2017 03/08/2017 02/02/2017  Falls in the past year? No No No No No   Functional Status Survey:    Vitals:   03/06/18 1338  BP: (!) 151/65  Pulse: (!) 59  Resp: 16  Temp: 97.6 F (36.4 C)  TempSrc: Oral  SpO2: 97%  Weight: 117 lb (53.1 kg)  Height: 5\' 3"  (1.6 m)   Body mass index is 20.73  kg/m. Physical Exam  Constitutional: No distress.  Thin frail female  HENT:  Head: Normocephalic.  Right Ear: External ear normal.  Left Ear: External ear normal.  Eyes: Conjunctivae are normal.  Cardiovascular: Intact distal pulses. An irregularly irregular rhythm present.  Pulmonary/Chest: Effort normal and breath sounds normal. No respiratory distress.  Abdominal: Soft. Bowel sounds are normal. She exhibits no distension. There is no tenderness. There is no guarding.  Musculoskeletal: Normal range of motion. She exhibits no edema or deformity.  Neurological: She is alert.  Oriented to self and room as [place].   Skin: Skin is warm and dry. Capillary refill takes less than 2 seconds.  Psychiatric: She has a normal mood and affect.  Vitals reviewed.   Labs reviewed: Recent Labs    04/14/17 1224 12/25/17 0600  NA 131* 136*  K 4.9 4.7  CL 96*  --   CO2 26  --   GLUCOSE 89  --   BUN 20 21  CREATININE 0.85 0.9  CALCIUM 9.3  --    Recent Labs    04/14/17 1224  AST 18  ALT 11  ALKPHOS 53  BILITOT 0.6  PROT 6.5  ALBUMIN 3.9   Recent Labs    04/14/17 1224 07/17/17 12/25/17 0600 02/06/18 0700  WBC 6.6 6.1 5.9 11.1  NEUTROABS 4,224  --   --   --   HGB 13.8 13.0 13.5 14.3  HCT 38.1 38 42 43  MCV 93.6  --   --   --   PLT 205 190 220 220   Lab Results  Component Value Date   TSH 1.64 12/25/2017   No results found for: HGBA1C No results found for: CHOL, HDL, LDLCALC, LDLDIRECT, TRIG, CHOLHDL  Significant Diagnostic Results in last 30 days:  No results found.  Assessment/Plan  1. Acute cystitis without hematuria Resolved. No signs or symptoms of cystitis. Completed Bactrim, remains on Trimpex for recurrent UTI prevention. She is reminded to wipe front to back, but when she uses the restroom alone this is hard to monitor.  2. Poor appetite She continues to have a poor appetite reporting the food is not that great and she rather not have that  food, but she is unable to tell me what food she would like or not like. She is encouraged to drink fluids, as this might be a cause of her UTI recurrence.   3. Chronic atrial fibrillation (HCC) She is rate controlled today at 59bpm. She is on toprol and eliquis will continue these  to reduce risk of CVA.   4. Late onset Alzheimer's disease with behavioral disturbance She has significant progression of her AD. She has declined rather quickly over the last year. She has adjusted to memory care. Still does not recognize husband when he visits her in the afternoons, stating today he had passed away.   5. Gait abnormality She faithfully uses her roller walker with seat and does well with this. She does exhibit a short step slight shuffle with gait today. But overall appears to move with intention and states the walker makes her feel safer.   6. Depression, major, single episode, moderate ([place]) She had a hard time when she moved to memory care and was showing signs of depression, so paired with poor appetite she was started and continues to be on remeron.   Family/ staff Communication: spoke with nurse  Labs/tests ordered:  None at this time.

## 2018-03-12 DIAGNOSIS — L570 Actinic keratosis: Secondary | ICD-10-CM | POA: Diagnosis not present

## 2018-03-12 DIAGNOSIS — Z85828 Personal history of other malignant neoplasm of skin: Secondary | ICD-10-CM | POA: Diagnosis not present

## 2018-03-12 NOTE — Addendum Note (Signed)
Addended by: Hollace Kinnier L on: 03/12/2018 10:07 AM   Modules accepted: Level of Service

## 2018-03-28 ENCOUNTER — Encounter: Payer: Self-pay | Admitting: Internal Medicine

## 2018-03-30 ENCOUNTER — Encounter: Payer: Self-pay | Admitting: Internal Medicine

## 2018-03-30 NOTE — Progress Notes (Signed)
Letter requested by pt's husband indicating long-term care need in memory care.

## 2018-04-26 ENCOUNTER — Non-Acute Institutional Stay: Payer: Medicare Other | Admitting: Adult Health

## 2018-04-26 DIAGNOSIS — F02818 Dementia in other diseases classified elsewhere, unspecified severity, with other behavioral disturbance: Secondary | ICD-10-CM

## 2018-04-26 DIAGNOSIS — I482 Chronic atrial fibrillation, unspecified: Secondary | ICD-10-CM

## 2018-04-26 DIAGNOSIS — K5901 Slow transit constipation: Secondary | ICD-10-CM

## 2018-04-26 DIAGNOSIS — G301 Alzheimer's disease with late onset: Secondary | ICD-10-CM | POA: Diagnosis not present

## 2018-04-26 DIAGNOSIS — Z87440 Personal history of urinary (tract) infections: Secondary | ICD-10-CM | POA: Diagnosis not present

## 2018-04-26 DIAGNOSIS — F0281 Dementia in other diseases classified elsewhere with behavioral disturbance: Secondary | ICD-10-CM | POA: Diagnosis not present

## 2018-04-26 DIAGNOSIS — I10 Essential (primary) hypertension: Secondary | ICD-10-CM

## 2018-04-26 DIAGNOSIS — E871 Hypo-osmolality and hyponatremia: Secondary | ICD-10-CM

## 2018-04-27 DIAGNOSIS — K5900 Constipation, unspecified: Secondary | ICD-10-CM | POA: Insufficient documentation

## 2018-04-27 NOTE — Assessment & Plan Note (Signed)
Controlled, continue Toprol

## 2018-04-27 NOTE — Assessment & Plan Note (Signed)
Treated for an UTI in March which resolved. Continue on trimethoprim for UTI prevention

## 2018-04-27 NOTE — Assessment & Plan Note (Signed)
Continue miralax 17 grams qd 

## 2018-04-27 NOTE — Progress Notes (Signed)
Location:  Occupational psychologist of Service:  ALF (13) Provider:   [name], ANP [place] [phone]   [name], DO  Patient Care Team: [name], DO as PCP - General (Geriatric Medicine) [name], MD as Consulting Physician (Cardiology) Community, [facility] [name], MD as Consulting Physician (Gastroenterology) [name], [name], MD as Consulting Physician (Dermatology) [name], MD as Consulting Physician (Cardiology) [name], MD as Consulting Physician (Ophthalmology) Zimmer, [name], NP as Nurse Practitioner (Internal Medicine) [name], MD as Consulting Physician (Gynecology)  Extended Emergency Contact Information Primary Emergency Contact: [facility] Address: [address]          [place], [place] [address] [name] of [place] Phone: [phone] Work Phone: [phone] Mobile Phone: [phone] Relation: Spouse  Code Status:  DNR Goals of care: Advanced Directive information Advanced Directives 03/06/2018  Does Patient Have a Medical Advance Directive? Yes  Type of Paramedic of [place];Living will;Out of facility DNR (pink MOST or yellow form)  Does patient want to make changes to medical advance directive? No - Patient declined  Copy of [place] in Chart? Yes  Would patient like information on creating a medical advance directive? -  Pre-existing out of facility DNR order (yellow form or pink MOST form) Yellow form placed in chart (order not valid for inpatient use)     Chief Complaint  Patient presents with  . Medical Management of Chronic Issues    HPI:  Pt is a 84 y.o. female seen today for medical management of chronic diseases.   She resides in the memory care setting. Her husband visits regularly and she participates in activities as well. There are no complaints  regarding her care.  She refuses assessment during my visit stating "I am trying to sleep,come back later"  "I am going to see my friend Tripp later and I need to rest".   Dementia: remains ambulatory but needs assistance with ADLs No agitation, or reports of hallucinations  HTN Controlled  No recent symptoms of UTI  Afib: no palpitatons, sob, weight gain, cp etc Takes eliquis for CVA risk reduction  Constipation: regular BMs on miralax  Past Medical History:  Diagnosis Date  . Actinic keratoses   . Allergy   . Alzheimer disease 05/21/2015  . Anxiety   . Arrhythmia    atrial fib  . Atrial fibrillation ([place]) 12/2012  . Basal cell carcinoma of skin   . Cataracts, both eyes 2005, 2013   Cataract extraction/IOL implant  . Current use of long term anticoagulation 12/2012   Eliquis  . Depression   . Diverticulosis 2008  . Fracture of scaphoid bone of hand 08/05/2004  . History of UTI   . Hyperlipidemia   . Hypertension   . Hyponatremia    SIADH  . Low sodium levels   . Mild cognitive impairment   . Osteopenia   . Rosacea   . Squamous cell carcinoma   . Thyroid nodule    Past Surgical History:  Procedure Laterality Date  . ABDOMINAL HYSTERECTOMY  1970   TAH-BSO  . cataracts Bilateral 2/05 and 3/05  . CESAREAN SECTION     questionable    Allergies  Allergen Reactions  . Solifenacin Other (See Comments)  . Ciprofloxacin Other (See Comments)    Reaction unknown  . Latex Other (See Comments)    Reaction unknown  . Macrobid [Nitrofurantoin Macrocrystal]  Nausea And Vomiting  . Macrodantin [Nitrofurantoin] Nausea Only    Upset stomach  . Other Other (See Comments)    Popcorn- Diverticulosis  . Strawberry Extract Other (See Comments)    Diverticulosis   . Tramadol Nausea Only    Outpatient Encounter Medications as of 04/26/2018  Medication Sig  . acetaminophen (TYLENOL) 325 MG tablet Take 325 mg by mouth every 6 (six) hours as needed.  Marland Kitchen apixaban (ELIQUIS) 2.5 MG  TABS tablet Take 1 tablet (2.5 mg total) by mouth 2 (two) times daily.  . Calcium Carbonate-Vitamin D (CALCIUM 600+D PO) Take by mouth daily.  . carboxymethylcellulose (REFRESH PLUS) 0.5 % SOLN Place 1 drop into both eyes every 3 (three) hours.  . cholecalciferol (VITAMIN D) 1000 UNITS tablet Take 2,000 Units by mouth daily.  . magnesium hydroxide (MILK OF MAGNESIA) 400 MG/5ML suspension Take 20 mLs by mouth daily as needed for mild constipation.  . Menthol, Topical Analgesic, (BIOFREEZE EX) Apply 1 application topically 3 (three) times a week. Left wrist  . metoprolol succinate (TOPROL-XL) 25 MG 24 hr tablet Take 12.5 mg by mouth daily.  . mirtazapine (REMERON) 15 MG tablet Take 15 mg at bedtime by mouth.  . Multiple Vitamin (MULTIVITAMIN) tablet Take 1 tablet by mouth daily.  . polyethylene glycol (MIRALAX / GLYCOLAX) packet Take 17 g by mouth daily.   Marland Kitchen saccharomyces boulardii (FLORASTOR) 250 MG capsule Take 250 mg by mouth daily.  . sodium chloride 1 G tablet Take 1 g by mouth 2 (two) times daily with a meal.  . trimethoprim (TRIMPEX) 100 MG tablet Take 100 mg by mouth daily.   No facility-administered encounter medications on file as of 04/26/2018.     Review of Systems  Unable to perform ROS: Dementia    Immunization History  Administered Date(s) Administered  . H1N1 12/04/2008  . Influenza-Unspecified 09/15/2003, 11/01/2010, 09/12/2011, 07/22/2016, 09/11/2017  . Pneumococcal Conjugate-13 04/14/2017  . Pneumococcal Polysaccharide-23 12/19/2008  . Td 09/15/2003, 01/06/2016  . Zoster Recombinat (Shingrix) 08/21/2017, 11/21/2017   Pertinent  Health Maintenance Due  Topic Date Due  . INFLUENZA VACCINE  06/21/2018  . DEXA SCAN  Completed  . PNA vac Low Risk Adult  Completed   Fall Risk  07/12/2017 06/21/2017 05/04/2017 03/08/2017 02/02/2017  Falls in the past year? No No No No No   Functional Status Survey:    Vitals:   04/26/18 1457  Weight: 118 lb 12.8 oz (53.9 kg)   Body  mass index is 21.04 kg/m.  Wt Readings from Last 3 Encounters:  04/26/18 118 lb 12.8 oz (53.9 kg)  03/06/18 117 lb (53.1 kg)  02/06/18 119 lb (54 kg)   Physical Exam  Constitutional: No distress.  Neurological: She is alert.  Oriented to self only  Skin: She is not diaphoretic.  Psychiatric: She has a normal mood and affect.    Labs reviewed: Recent Labs    12/25/17 0600  NA 136*  K 4.7  BUN 21  CREATININE 0.9   No results for input(s): AST, ALT, ALKPHOS, BILITOT, PROT, ALBUMIN in the last 8760 hours. Recent Labs    07/17/17 12/25/17 0600 02/06/18 0700  WBC 6.1 5.9 11.1  HGB 13.0 13.5 14.3  HCT 38 42 43  PLT 190 220 220   Lab Results  Component Value Date   TSH 1.64 12/25/2017   No results found for: HGBA1C No results found for: CHOL, HDL, LDLCALC, LDLDIRECT, TRIG, CHOLHDL  Significant Diagnostic Results in last 30 days:  No  results found.  Assessment/Plan  Late onset Alzheimer's disease with behavioral disturbance Doing well in the skilled care setting. She is not on medication for memory due to previous intolerance. She is quite advanced but remains ambulatory and pleasant.   History of recurrent UTIs Treated for an UTI in March which resolved. Continue on trimethoprim for UTI prevention  Hyponatremia Continue sodium tablets and monitor BMP at regular intervals.   Hypertension Controlled, continue Toprol  Atrial fibrillation Rate controlled. Continue Eliquis for CVA risk reduction. If she begins to have more falls would consider discontinuing.  Constipation Continue miralax 17 grams qd    Family/ staff Communication: staff/resident Labs/tests ordered:  NA

## 2018-04-27 NOTE — Assessment & Plan Note (Signed)
Doing well in the skilled care setting. She is not on medication for memory due to previous intolerance. She is quite advanced but remains ambulatory and pleasant.

## 2018-04-27 NOTE — Assessment & Plan Note (Signed)
Rate controlled. Continue Eliquis for CVA risk reduction. If she begins to have more falls would consider discontinuing.

## 2018-04-27 NOTE — Assessment & Plan Note (Signed)
Continue sodium tablets and monitor BMP at regular intervals.

## 2018-05-15 DIAGNOSIS — L820 Inflamed seborrheic keratosis: Secondary | ICD-10-CM | POA: Diagnosis not present

## 2018-05-15 DIAGNOSIS — L570 Actinic keratosis: Secondary | ICD-10-CM | POA: Diagnosis not present

## 2018-05-15 DIAGNOSIS — Z85828 Personal history of other malignant neoplasm of skin: Secondary | ICD-10-CM | POA: Diagnosis not present

## 2018-05-15 DIAGNOSIS — D485 Neoplasm of uncertain behavior of skin: Secondary | ICD-10-CM | POA: Diagnosis not present

## 2018-05-15 DIAGNOSIS — L821 Other seborrheic keratosis: Secondary | ICD-10-CM | POA: Diagnosis not present

## 2018-06-29 ENCOUNTER — Non-Acute Institutional Stay: Payer: Medicare Other | Admitting: Adult Health

## 2018-06-29 DIAGNOSIS — G301 Alzheimer's disease with late onset: Secondary | ICD-10-CM | POA: Diagnosis not present

## 2018-06-29 DIAGNOSIS — I482 Chronic atrial fibrillation, unspecified: Secondary | ICD-10-CM

## 2018-06-29 DIAGNOSIS — I10 Essential (primary) hypertension: Secondary | ICD-10-CM

## 2018-06-29 DIAGNOSIS — E871 Hypo-osmolality and hyponatremia: Secondary | ICD-10-CM

## 2018-06-29 DIAGNOSIS — K5901 Slow transit constipation: Secondary | ICD-10-CM

## 2018-06-29 DIAGNOSIS — F321 Major depressive disorder, single episode, moderate: Secondary | ICD-10-CM | POA: Diagnosis not present

## 2018-06-29 DIAGNOSIS — Z87440 Personal history of urinary (tract) infections: Secondary | ICD-10-CM

## 2018-06-29 DIAGNOSIS — F0281 Dementia in other diseases classified elsewhere with behavioral disturbance: Secondary | ICD-10-CM

## 2018-06-29 NOTE — Progress Notes (Signed)
Location:  Occupational psychologist of Service:  SNF (31) Provider:   [name], ANP [place] [phone]   [name], DO  Patient Care Team: [name], DO as PCP - General (Geriatric Medicine) [name], MD as Consulting Physician (Cardiology) [facility], [facility] [name], MD as Consulting Physician (Gastroenterology) [name], [name], MD as Consulting Physician (Dermatology) [name], MD as Consulting Physician (Cardiology) [name], MD as Consulting Physician (Ophthalmology) Zimmer, [name], NP as Nurse Practitioner (Internal Medicine) [name], MD as Consulting Physician (Gynecology)  Extended Emergency Contact Information Primary Emergency Contact: [facility] Address: [address]          [place], [place] [address] [name] of [place] Phone: [phone] Work Phone: [phone] Mobile Phone: [phone] Relation: Spouse  Code Status:  DNR Goals of care: Advanced Directive information Advanced Directives 03/06/2018  Does Patient Have a Medical Advance Directive? Yes  Type of Paramedic of [place];Living will;Out of facility DNR (pink MOST or yellow form)  Does patient want to make changes to medical advance directive? No - Patient declined  Copy of [place] in Chart? Yes  Would patient like information on creating a medical advance directive? -  Pre-existing out of facility DNR order (yellow form or pink MOST form) Yellow form placed in chart (order not valid for inpatient use)     Chief Complaint  Patient presents with  . Medical Management of Chronic Issues    HPI:  Pt is a 84 y.o. female seen today for medical management of chronic diseases.   She resides in memory care due to advanced dementia. Her husband visits regularly and she participates in activities at [facility]. There are  no complaints regarding her care.   AD: Staff deny issues with behaviors, hallucinations etc Ambulatory with a walker. Needs cuing and assistance with ADLs CT of the head in 2017 IMPRESSION: Stable changes of atrophy and moderate small vessel ischemic change. No acute intracranial abnormality.  Hyponatremia: NA 136 12/25/17, currently on sodium tablets No issues with edema  HTN: systolic BPs ranged 025-427 systolic No edema, sob , cp, etc  Afib: rate controlled, on eliquis for CVA risk reduction  Depression: no reports of crying or sadness. Good appetite with weight trending up, no sleep issues reported.  Currently on remeron.  Hx of recurrent UTI on trimethoprim. One UTI treated earlier this year.   Past Medical History:  Diagnosis Date  . Actinic keratoses   . Allergy   . Alzheimer disease 05/21/2015  . Anxiety   . Arrhythmia    atrial fib  . Atrial fibrillation ([place]) 12/2012  . Basal cell carcinoma of skin   . Cataracts, both eyes 2005, 2013   Cataract extraction/IOL implant  . Current use of long term anticoagulation 12/2012   Eliquis  . Depression   . Diverticulosis 2008  . Fracture of scaphoid bone of hand 08/05/2004  . History of UTI   . Hyperlipidemia   . Hypertension   . Hyponatremia    SIADH  . Low sodium levels   . Mild cognitive impairment   . Osteopenia   . Rosacea   . Squamous cell carcinoma   . Thyroid nodule    Past Surgical History:  Procedure Laterality Date  . ABDOMINAL HYSTERECTOMY  1970   TAH-BSO  . cataracts Bilateral 2/05 and 3/05  . CESAREAN SECTION     questionable  Allergies  Allergen Reactions  . Solifenacin Other (See Comments)  . Ciprofloxacin Other (See Comments)    Reaction unknown  . Latex Other (See Comments)    Reaction unknown  . Macrobid [Nitrofurantoin Macrocrystal] Nausea And Vomiting  . Macrodantin [Nitrofurantoin] Nausea Only    Upset stomach  . Other Other (See Comments)    Popcorn- Diverticulosis  .  Strawberry Extract Other (See Comments)    Diverticulosis   . Tramadol Nausea Only    Outpatient Encounter Medications as of 06/29/2018  Medication Sig  . acetaminophen (TYLENOL) 325 MG tablet Take 325 mg by mouth every 6 (six) hours as needed.  Marland Kitchen apixaban (ELIQUIS) 2.5 MG TABS tablet Take 1 tablet (2.5 mg total) by mouth 2 (two) times daily.  . Calcium Carbonate-Vitamin D (CALCIUM 600+D PO) Take by mouth daily.  . carboxymethylcellulose (REFRESH PLUS) 0.5 % SOLN Place 1 drop into both eyes every 3 (three) hours.  . cholecalciferol (VITAMIN D) 1000 UNITS tablet Take 2,000 Units by mouth daily.  . magnesium hydroxide (MILK OF MAGNESIA) 400 MG/5ML suspension Take 20 mLs by mouth daily as needed for mild constipation.  . Menthol, Topical Analgesic, (BIOFREEZE EX) Apply 1 application topically 3 (three) times a week. Left wrist  . metoprolol succinate (TOPROL-XL) 25 MG 24 hr tablet Take 12.5 mg by mouth daily.  . mirtazapine (REMERON) 15 MG tablet Take 15 mg at bedtime by mouth.  . Multiple Vitamin (MULTIVITAMIN) tablet Take 1 tablet by mouth daily.  . polyethylene glycol (MIRALAX / GLYCOLAX) packet Take 17 g by mouth daily.   Marland Kitchen saccharomyces boulardii (FLORASTOR) 250 MG capsule Take 250 mg by mouth daily.  . sodium chloride 1 G tablet Take 1 g by mouth 2 (two) times daily with a meal.  . trimethoprim (TRIMPEX) 100 MG tablet Take 100 mg by mouth daily.   No facility-administered encounter medications on file as of 06/29/2018.     Review of Systems  Unable to perform ROS: Dementia    Immunization History  Administered Date(s) Administered  . H1N1 12/04/2008  . Influenza-Unspecified 09/15/2003, 11/01/2010, 09/12/2011, 07/22/2016, 09/11/2017  . Pneumococcal Conjugate-13 04/14/2017  . Pneumococcal Polysaccharide-23 12/19/2008  . Td 09/15/2003, 01/06/2016  . Zoster Recombinat (Shingrix) 08/21/2017, 11/21/2017   Pertinent  Health Maintenance Due  Topic Date Due  . INFLUENZA VACCINE   06/21/2018  . DEXA SCAN  Completed  . PNA vac Low Risk Adult  Completed   Fall Risk  07/12/2017 06/21/2017 05/04/2017 03/08/2017 02/02/2017  Falls in the past year? No No No No No   Functional Status Survey:    Vitals:   07/01/18 0727  Weight: 121 lb 9.6 oz (55.2 kg)   Body mass index is 21.54 kg/m.  Wt Readings from Last 3 Encounters:  07/01/18 121 lb 9.6 oz (55.2 kg)  04/26/18 118 lb 12.8 oz (53.9 kg)  03/06/18 117 lb (53.1 kg)   Physical Exam  Constitutional: No distress.  HENT:  Head: Normocephalic and atraumatic.  Mouth/Throat: No oropharyngeal exudate.  Neck: No JVD present. No tracheal deviation present. No thyromegaly present.  Cardiovascular: Normal rate.  No murmur heard. irreg  Pulmonary/Chest: Effort normal and breath sounds normal. No respiratory distress. She has no wheezes.  Abdominal: Soft. Bowel sounds are normal.  Lymphadenopathy:    She has no cervical adenopathy.  Neurological: She is alert.  Oriented to self only. Pleasant and able to f/c.   Skin: Skin is warm and dry. She is not diaphoretic.  Psychiatric: She has a  normal mood and affect.  Nursing note and vitals reviewed.   Labs reviewed: Recent Labs    12/25/17 0600  NA 136*  K 4.7  BUN 21  CREATININE 0.9   No results for input(s): AST, ALT, ALKPHOS, BILITOT, PROT, ALBUMIN in the last 8760 hours. Recent Labs    07/17/17 12/25/17 0600 02/06/18 0700  WBC 6.1 5.9 11.1  HGB 13.0 13.5 14.3  HCT 38 42 43  PLT 190 220 220   Lab Results  Component Value Date   TSH 1.64 12/25/2017   No results found for: HGBA1C No results found for: CHOL, HDL, LDLCALC, LDLDIRECT, TRIG, CHOLHDL  Significant Diagnostic Results in last 30 days:  No results found.  Assessment/Plan 1. Hyponatremia Continue sodium tablets 1 gram twice daily Check BMP  2. Late onset Alzheimer's disease with behavioral disturbance Doing well in the memory care setting. Not currently on memory meds due to hx of s/e.   Continue supportive care.   3. Essential hypertension Goal BP <150/90.  Would avoid aggressive BP management due to her fall risk.  Staff need to check the BP more regularly and with a manual cuff. I will have the staff check her BP daily for one week and report to me.   4. Chronic atrial fibrillation (HCC) Rate controlled with metoprolol. Continue Eliquis 2.5 mg bid.   5. Slow transit constipation Continue miralax 17 grams qd.   6. Depression, major, single episode, moderate (HCC) Controlled, continue Remeron 15 mg qhs.   7. History of recurrent UTIs Continue trimethoprim 100 mg qd    Family/ staff Communication: staff/resident  Labs/tests ordered:  BMP

## 2018-07-01 ENCOUNTER — Encounter: Payer: Self-pay | Admitting: Adult Health

## 2018-07-02 LAB — BASIC METABOLIC PANEL
BUN: 18 (ref 4–21)
Creatinine: 0.9 (ref 0.5–1.1)
Glucose: 88
Potassium: 4.6 (ref 3.4–5.3)
Sodium: 136 — AB (ref 137–147)

## 2018-07-05 DIAGNOSIS — Z7901 Long term (current) use of anticoagulants: Secondary | ICD-10-CM | POA: Diagnosis not present

## 2018-07-05 DIAGNOSIS — I872 Venous insufficiency (chronic) (peripheral): Secondary | ICD-10-CM | POA: Diagnosis not present

## 2018-07-05 DIAGNOSIS — I10 Essential (primary) hypertension: Secondary | ICD-10-CM | POA: Diagnosis not present

## 2018-07-05 DIAGNOSIS — I480 Paroxysmal atrial fibrillation: Secondary | ICD-10-CM | POA: Diagnosis not present

## 2018-07-13 ENCOUNTER — Other Ambulatory Visit: Payer: Self-pay | Admitting: Internal Medicine

## 2018-07-13 DIAGNOSIS — Z1231 Encounter for screening mammogram for malignant neoplasm of breast: Secondary | ICD-10-CM

## 2018-07-17 ENCOUNTER — Encounter: Payer: Self-pay | Admitting: Internal Medicine

## 2018-07-17 DIAGNOSIS — S0502XA Injury of conjunctiva and corneal abrasion without foreign body, left eye, initial encounter: Secondary | ICD-10-CM | POA: Diagnosis not present

## 2018-07-17 DIAGNOSIS — H04123 Dry eye syndrome of bilateral lacrimal glands: Secondary | ICD-10-CM | POA: Diagnosis not present

## 2018-07-30 ENCOUNTER — Encounter: Payer: Self-pay | Admitting: Adult Health

## 2018-07-30 ENCOUNTER — Non-Acute Institutional Stay: Payer: Medicare Other | Admitting: Adult Health

## 2018-07-30 DIAGNOSIS — M25461 Effusion, right knee: Secondary | ICD-10-CM

## 2018-07-30 NOTE — Progress Notes (Signed)
Location:  Occupational psychologist of Service:  ALF (13) Provider:   [name], ANP [place] [phone]   [name], DO  Patient Care Team: [name], DO as PCP - General (Geriatric Medicine) [name], MD as Consulting Physician (Cardiology) Community, [facility] [name], MD as Consulting Physician (Gastroenterology) [name], [name], MD as Consulting Physician (Dermatology) [name], MD as Consulting Physician (Cardiology) [name], MD as Consulting Physician (Ophthalmology) Zimmer, [name], NP as Nurse Practitioner (Internal Medicine) [name], MD as Consulting Physician (Gynecology)  Extended Emergency Contact Information Primary Emergency Contact: [facility] Address: [address]          [place], [place] [address] [name] of [place] Phone: [phone] Work Phone: [phone] Mobile Phone: [phone] Relation: Spouse  Code Status:  DNR Goals of care: Advanced Directive information Advanced Directives 03/06/2018  Does Patient Have a Medical Advance Directive? Yes  Type of Paramedic of [place];Living will;Out of facility DNR (pink MOST or yellow form)  Does patient want to make changes to medical advance directive? No - Patient declined  Copy of [place] in Chart? Yes  Would patient like information on creating a medical advance directive? -  Pre-existing out of facility DNR order (yellow form or pink MOST form) Yellow form placed in chart (order not valid for inpatient use)     Chief Complaint  Patient presents with  . Acute Visit    right knee swelling    HPI:  Pt is a 84 y.o. female seen today for an acute visit for right knee swelling. The symptoms started one day ago. She has dementia and lives in the memory are unit. They deny that she had a fall and there is no bruising. The  knee was painful on 9/8 but the pain has improved as of 9/9. She has a chronic shuffling gait and uses a walker and now has a slight limp. No fever or drainage or wound. The staff have given her tylenol and ice.    Past Medical History:  Diagnosis Date  . Actinic keratoses   . Allergy   . Alzheimer disease 05/21/2015  . Anxiety   . Arrhythmia    atrial fib  . Atrial fibrillation ([place]) 12/2012  . Basal cell carcinoma of skin   . Cataracts, both eyes 2005, 2013   Cataract extraction/IOL implant  . Current use of long term anticoagulation 12/2012   Eliquis  . Depression   . Diverticulosis 2008  . Fracture of scaphoid bone of hand 08/05/2004  . History of UTI   . Hyperlipidemia   . Hypertension   . Hyponatremia    SIADH  . Low sodium levels   . Mild cognitive impairment   . Osteopenia   . Rosacea   . Squamous cell carcinoma   . Thyroid nodule    Past Surgical History:  Procedure Laterality Date  . ABDOMINAL HYSTERECTOMY  1970   TAH-BSO  . cataracts Bilateral 2/05 and 3/05  . CESAREAN SECTION     questionable    Allergies  Allergen Reactions  . Solifenacin Other (See Comments)  . Ciprofloxacin Other (See Comments)    Reaction unknown  . Latex Other (See Comments)    Reaction unknown  . Macrobid [Nitrofurantoin Macrocrystal] Nausea And Vomiting  . Macrodantin [Nitrofurantoin] Nausea Only    Upset stomach  . Other Other (See Comments)    Popcorn-  Diverticulosis  . Strawberry Extract Other (See Comments)    Diverticulosis   . Tramadol Nausea Only    Outpatient Encounter Medications as of 07/30/2018  Medication Sig  . acetaminophen (TYLENOL) 325 MG tablet Take 325 mg by mouth every 6 (six) hours as needed.  Marland Kitchen apixaban (ELIQUIS) 2.5 MG TABS tablet Take 1 tablet (2.5 mg total) by mouth 2 (two) times daily.  . Calcium Carbonate-Vitamin D (CALCIUM 600+D PO) Take by mouth daily.  . carboxymethylcellulose (REFRESH PLUS) 0.5 % SOLN Place 1 drop into both eyes every 3  (three) hours.  . cholecalciferol (VITAMIN D) 1000 UNITS tablet Take 2,000 Units by mouth daily.  . magnesium hydroxide (MILK OF MAGNESIA) 400 MG/5ML suspension Take 20 mLs by mouth daily as needed for mild constipation.  . Menthol, Topical Analgesic, (BIOFREEZE EX) Apply 1 application topically 3 (three) times a week. Left wrist  . metoprolol succinate (TOPROL-XL) 25 MG 24 hr tablet Take 12.5 mg by mouth daily.  . mirtazapine (REMERON) 15 MG tablet Take 15 mg at bedtime by mouth.  . Multiple Vitamin (MULTIVITAMIN) tablet Take 1 tablet by mouth daily.  . polyethylene glycol (MIRALAX / GLYCOLAX) packet Take 17 g by mouth daily.   Marland Kitchen saccharomyces boulardii (FLORASTOR) 250 MG capsule Take 250 mg by mouth daily.  . sodium chloride 1 G tablet Take 1 g by mouth 2 (two) times daily with a meal.  . trimethoprim (TRIMPEX) 100 MG tablet Take 100 mg by mouth daily.   No facility-administered encounter medications on file as of 07/30/2018.     Review of Systems  Constitutional: Positive for activity change (continues to ambulate with mild limp). Negative for appetite change, chills, diaphoresis, fatigue, fever and unexpected weight change.  HENT: Negative for congestion.   Respiratory: Negative for cough, shortness of breath and wheezing.   Cardiovascular: Positive for leg swelling. Negative for chest pain and palpitations.  Gastrointestinal: Negative for abdominal distention, abdominal pain, constipation and diarrhea.  Genitourinary: Negative for difficulty urinating and dysuria.  Musculoskeletal: Positive for arthralgias, gait problem and joint swelling. Negative for back pain and myalgias.  Neurological: Negative for dizziness, tremors, seizures, syncope, facial asymmetry, speech difficulty, weakness, light-headedness, numbness and headaches.       Chronic shuffling gait  Psychiatric/Behavioral: Positive for confusion. Negative for agitation and behavioral problems.    Immunization History    Administered Date(s) Administered  . H1N1 12/04/2008  . Influenza-Unspecified 09/15/2003, 11/01/2010, 09/12/2011, 07/22/2016, 09/11/2017  . Pneumococcal Conjugate-13 04/14/2017  . Pneumococcal Polysaccharide-23 12/19/2008  . Td 09/15/2003, 01/06/2016  . Zoster Recombinat (Shingrix) 08/21/2017, 11/21/2017   Pertinent  Health Maintenance Due  Topic Date Due  . INFLUENZA VACCINE  06/21/2018  . DEXA SCAN  Completed  . PNA vac Low Risk Adult  Completed   Fall Risk  07/12/2017 06/21/2017 05/04/2017 03/08/2017 02/02/2017  Falls in the past year? No No No No No   Functional Status Survey:    Vitals:   07/30/18 1019  BP: (!) 130/58  Pulse: (!) 56  Resp: 20  Temp: (!) 96.8 F (36 C)  SpO2: 97%   There is no height or weight on file to calculate BMI. Physical Exam  Constitutional: No distress.  HENT:  Head: Normocephalic and atraumatic.  Neck: No JVD present.  Cardiovascular: Normal rate.  No murmur heard. irreg  Pulmonary/Chest: Effort normal and breath sounds normal. No respiratory distress. She has no wheezes.  Abdominal: Soft. Bowel sounds are normal.  Musculoskeletal: Normal range of motion. She exhibits  edema (right knee above and below, with warmth and redness, no bruising.  Large joint effusio noted on the right knee.) and tenderness. She exhibits no deformity.  Neurological: She is alert.  Oriented to self only.   Skin: Skin is warm and dry. She is not diaphoretic.  Psychiatric: She has a normal mood and affect.    Labs reviewed: Recent Labs    12/25/17 0600  NA 136*  K 4.7  BUN 21  CREATININE 0.9   No results for input(s): AST, ALT, ALKPHOS, BILITOT, PROT, ALBUMIN in the last 8760 hours. Recent Labs    12/25/17 0600 02/06/18 0700  WBC 5.9 11.1  HGB 13.5 14.3  HCT 42 43  PLT 220 220   Lab Results  Component Value Date   TSH 1.64 12/25/2017   No results found for: HGBA1C No results found for: CHOL, HDL, LDLCALC, LDLDIRECT, TRIG, CHOLHDL  Significant  Diagnostic Results in last 30 days:  No results found.  Assessment/Plan   1. Effusion of right knee Fairly large No reports of trauma and appears to be inflammatory ?gout vs other inflammatory process. Referral to orthopedics for possible joint aspiration Start prednisone 20 mg BID x 5 days with food until seen by ortho 9/11 Ice 15 min tid x 48 hrs   Family/ staff Communication: discussed with her husband Tripp  Labs/tests ordered:  referral

## 2018-07-31 ENCOUNTER — Non-Acute Institutional Stay: Payer: Medicare Other | Admitting: Internal Medicine

## 2018-07-31 ENCOUNTER — Encounter: Payer: Self-pay | Admitting: Internal Medicine

## 2018-07-31 DIAGNOSIS — F0281 Dementia in other diseases classified elsewhere with behavioral disturbance: Secondary | ICD-10-CM | POA: Diagnosis not present

## 2018-07-31 DIAGNOSIS — G301 Alzheimer's disease with late onset: Secondary | ICD-10-CM

## 2018-07-31 DIAGNOSIS — M25461 Effusion, right knee: Secondary | ICD-10-CM

## 2018-07-31 DIAGNOSIS — F02818 Dementia in other diseases classified elsewhere, unspecified severity, with other behavioral disturbance: Secondary | ICD-10-CM

## 2018-07-31 NOTE — Progress Notes (Signed)
Patient ID: Cathy Dickerson, female   DOB: Jun 18, 1927, 84 y.o.   MRN: [id number]  Location:  [facility] Room Number: 732 memory care Place of Service:  ALF [phone]) Provider:   [name], DO  Patient Care Team: [name], DO as PCP - General (Geriatric Medicine) [name], MD as Consulting Physician (Cardiology) [facility] [name], MD as Consulting Physician (Gastroenterology) [name], [name], MD as Consulting Physician (Dermatology) [name], MD as Consulting Physician (Cardiology) [name], MD as Consulting Physician (Ophthalmology) Zimmer, [name], NP as Nurse Practitioner (Internal Medicine) [name], MD as Consulting Physician (Gynecology)  Extended Emergency Contact Information Primary Emergency Contact: Radi,[name] Address: [address]          [place], [place] [address] [name] of [place] Phone: [phone] Work Phone: [phone] Mobile Phone: [phone] Relation: Spouse  Code Status:  DNR Goals of care: Advanced Directive information Advanced Directives 07/31/2018  Does Patient Have a Medical Advance Directive? Yes  Type of Paramedic of [place];Living will;Out of facility DNR (pink MOST or yellow form)  Does patient want to make changes to medical advance directive? No - Patient declined  Copy of [place] in Chart? Yes  Would patient like information on creating a medical advance directive? -  Pre-existing out of facility DNR order (yellow form or pink MOST form) Yellow form placed in chart (order not valid for inpatient use)     Chief Complaint  Patient presents with  . Acute Visit    large knee effusion    HPI:  Pt is a 84 y.o. female with advanced AD who lives in memory care seen today for an acute visit for large right knee effusion and pain.  Alyse Low saw patient yesterday  and started her on prednisone.  This began over the past few days and she's been favoring the right knee, taking smaller steps especially when she first stands.  She may have had a fall before it began, but if she did, it was unwitnessed and got up on her own. Pt was also more confused than usual over the weekend.  Trip was there for the visit.  We discussed possible causes including knee injury, knee OA and pseudogout given the effusion.  We also discussed how pain, infections and medications can worsen memory temporarily (delirium).  It seems there's been improvement in her swelling since the prednisone was started and no problems with taking it.  She sees Dr. Ronnie Derby tomorrow at his office.   Past Medical History:  Diagnosis Date  . Actinic keratoses   . Allergy   . Alzheimer disease 05/21/2015  . Anxiety   . Arrhythmia    atrial fib  . Atrial fibrillation ([place]) 12/2012  . Basal cell carcinoma of skin   . Cataracts, both eyes 2005, 2013   Cataract extraction/IOL implant  . Current use of long term anticoagulation 12/2012   Eliquis  . Depression   . Diverticulosis 2008  . Fracture of scaphoid bone of hand 08/05/2004  . History of UTI   . Hyperlipidemia   . Hypertension   . Hyponatremia    SIADH  . Low sodium levels   . Mild cognitive impairment   . Osteopenia   . Rosacea   . Squamous cell carcinoma   . Thyroid nodule    Past Surgical History:  Procedure Laterality Date  . ABDOMINAL HYSTERECTOMY  1970  TAH-BSO  . cataracts Bilateral 2/05 and 3/05  . CESAREAN SECTION     questionable    Allergies  Allergen Reactions  . Solifenacin Other (See Comments)  . Ciprofloxacin Other (See Comments)    Reaction unknown  . Latex Other (See Comments)    Reaction unknown  . Macrobid [Nitrofurantoin Macrocrystal] Nausea And Vomiting  . Macrodantin [Nitrofurantoin] Nausea Only    Upset stomach  . Other Other (See Comments)    Popcorn- Diverticulosis  . Strawberry Extract Other (See  Comments)    Diverticulosis   . Tramadol Nausea Only    Outpatient Encounter Medications as of 07/31/2018  Medication Sig  . acetaminophen (TYLENOL) 325 MG tablet Take 325 mg by mouth every 6 (six) hours as needed.  Marland Kitchen apixaban (ELIQUIS) 2.5 MG TABS tablet Take 1 tablet (2.5 mg total) by mouth 2 (two) times daily.  . Calcium Carbonate-Vitamin D (CALCIUM 600+D PO) Take by mouth daily.  . carboxymethylcellulose (REFRESH PLUS) 0.5 % SOLN Place 1 drop into both eyes every 3 (three) hours.  . cholecalciferol (VITAMIN D) 1000 UNITS tablet Take 2,000 Units by mouth daily.  . magnesium hydroxide (MILK OF MAGNESIA) 400 MG/5ML suspension Take 20 mLs by mouth daily as needed for mild constipation.  . metoprolol succinate (TOPROL-XL) 25 MG 24 hr tablet Take 12.5 mg by mouth daily.  . mirtazapine (REMERON) 15 MG tablet Take 15 mg at bedtime by mouth.  . Multiple Vitamin (MULTIVITAMIN) tablet Take 1 tablet by mouth daily.  . polyethylene glycol (MIRALAX / GLYCOLAX) packet Take 17 g by mouth daily.   . predniSONE (DELTASONE) 20 MG tablet Take 20 mg by mouth 2 (two) times daily with a meal.  . saccharomyces boulardii (FLORASTOR) 250 MG capsule Take 250 mg by mouth daily.  . sodium chloride 1 G tablet Take 1 g by mouth 2 (two) times daily with a meal.  . trimethoprim (TRIMPEX) 100 MG tablet Take 100 mg by mouth daily.  . [DISCONTINUED] Menthol, Topical Analgesic, (BIOFREEZE EX) Apply 1 application topically 3 (three) times a week. Left wrist   No facility-administered encounter medications on file as of 07/31/2018.     Review of Systems  Constitutional: Negative for activity change, appetite change and unexpected weight change.  HENT: Negative for congestion.   Eyes: Positive for visual disturbance.  Respiratory: Negative for chest tightness and shortness of breath.   Cardiovascular: Negative for chest pain and palpitations.  Gastrointestinal: Negative for constipation.  Genitourinary: Negative for  dysuria.  Musculoskeletal: Positive for arthralgias, back pain, gait problem and joint swelling.  Neurological: Positive for weakness. Negative for dizziness.  Psychiatric/Behavioral: Positive for confusion. Negative for agitation.    Immunization History  Administered Date(s) Administered  . H1N1 12/04/2008  . Influenza-Unspecified 09/15/2003, 11/01/2010, 09/12/2011, 07/22/2016, 09/11/2017  . Pneumococcal Conjugate-13 04/14/2017  . Pneumococcal Polysaccharide-23 12/19/2008  . Td 09/15/2003, 01/06/2016  . Zoster Recombinat (Shingrix) 08/21/2017, 11/21/2017   Pertinent  Health Maintenance Due  Topic Date Due  . INFLUENZA VACCINE  06/21/2018  . DEXA SCAN  Completed  . PNA vac Low Risk Adult  Completed   Fall Risk  07/12/2017 06/21/2017 05/04/2017 03/08/2017 02/02/2017  Falls in the past year? No No No No No   Functional Status Survey:    Vitals:   07/31/18 1313  BP: (!) 142/90  Pulse: 61  Resp: 17  Temp: (!) 97.3 F (36.3 C)  TempSrc: Oral  SpO2: 97%  Weight: 120 lb (54.4 kg)  Height: 5\' 3"  (1.6 m)  Body mass index is 21.26 kg/m. Physical Exam  Constitutional: No distress.  Eyes:  glasses  Cardiovascular: Normal rate, regular rhythm, normal heart sounds and intact distal pulses.  Pulmonary/Chest: Effort normal and breath sounds normal. No respiratory distress.  Abdominal: Bowel sounds are normal.  Musculoskeletal: She exhibits tenderness.  Taking smaller steps with right foot than left and especially tentative on first standing; tenderness over medial aspect of right knee, normal ROM, small effusion ;uses rollator walker  Neurological: She is alert.  Skin: Skin is warm and dry.  Increased warmth over right knee, but no erythema  Psychiatric: She has a normal mood and affect.    Labs reviewed: Recent Labs    12/25/17 0600 07/02/18 0600  NA 136* 136*  K 4.7 4.6  BUN 21 18  CREATININE 0.9 0.9   No results for input(s): AST, ALT, ALKPHOS, BILITOT, PROT, ALBUMIN  in the last 8760 hours. Recent Labs    12/25/17 0600 02/06/18 0700  WBC 5.9 11.1  HGB 13.5 14.3  HCT 42 43  PLT 220 220   Lab Results  Component Value Date   TSH 1.64 12/25/2017   Assessment/Plan 1. Effusion of right knee joint -differential:  Right knee meniscal injury, OA, pseudogout, infection -cont prednisone as ordered -ice if pt feels good with this (tends to be cold all the time though)  2. Late onset Alzheimer's disease with behavioral disturbance -cognition had been worse with this over the weekend, but seems baseline now -at time of visit pt did not recall that she was married to her husband, just that he was a good friend  Pharmacist, hospital Communication: discussed with husband, Trip   Labs/tests ordered:  No new, keep appt with Dr. Ronnie Derby tomorrow  Jaidee Stipe L. Drayven Marchena, D.O. [facility] [address], [place] [address] Cell Phone (Mon-Fri 8am-5pm):  [phone] On Call:  [phone] & follow prompts after 5pm & weekends Office Phone:  [phone] Office Fax:  [phone]

## 2018-08-01 DIAGNOSIS — M1711 Unilateral primary osteoarthritis, right knee: Secondary | ICD-10-CM | POA: Diagnosis not present

## 2018-08-01 DIAGNOSIS — G8929 Other chronic pain: Secondary | ICD-10-CM | POA: Diagnosis not present

## 2018-08-01 DIAGNOSIS — M7051 Other bursitis of knee, right knee: Secondary | ICD-10-CM | POA: Diagnosis not present

## 2018-08-15 ENCOUNTER — Non-Acute Institutional Stay (INDEPENDENT_AMBULATORY_CARE_PROVIDER_SITE_OTHER): Payer: Medicare Other

## 2018-08-15 DIAGNOSIS — Z0000 Encounter for general adult medical examination without abnormal findings: Secondary | ICD-10-CM

## 2018-08-15 NOTE — Patient Instructions (Addendum)
Cathy Dickerson , Thank you for taking time to come for your Medicare Wellness Visit. I appreciate your ongoing commitment to your health goals. Please review the following plan we discussed and let me know if I can assist you in the future.   Screening recommendations/referrals: Colonoscopy excluded, over age 82 Mammogram excluded, over age 1 Bone Density up to date Recommended yearly ophthalmology/optometry visit for glaucoma screening and checkup Recommended yearly dental visit for hygiene and checkup  Vaccinations: Influenza vaccine due, will receive at [facility] Pneumococcal vaccine up to date, completed Tdap vaccine up to date due, 01/05/2026 Shingles vaccine not in past records    Advanced directives: In chart  Conditions/risks identified: none  Next appointment: Dr. Mariea Clonts makes rounds   Preventive Care 82 Years and Older, Female Preventive care refers to lifestyle choices and visits with your health care provider that can promote health and wellness. What does preventive care include?  A yearly physical exam. This is also called an annual well check.  Dental exams once or twice a year.  Routine eye exams. Ask your health care provider how often you should have your eyes checked.  Personal lifestyle choices, including:  Daily care of your teeth and gums.  Regular physical activity.  Eating a healthy diet.  Avoiding tobacco and drug use.  Limiting alcohol use.  Practicing safe sex.  Taking low-dose aspirin every day.  Taking vitamin and mineral supplements as recommended by your health care provider. What happens during an annual well check? The services and screenings done by your health care provider during your annual well check will depend on your age, overall health, lifestyle risk factors, and family history of disease. Counseling  Your health care provider may ask you questions about your:  Alcohol use.  Tobacco use.  Drug use.  Emotional  well-being.  Home and relationship well-being.  Sexual activity.  Eating habits.  History of falls.  Memory and ability to understand (cognition).  Work and work Statistician.  Reproductive health. Screening  You may have the following tests or measurements:  Height, weight, and BMI.  Blood pressure.  Lipid and cholesterol levels. These may be checked every 5 years, or more frequently if you are over 96 years old.  Skin check.  Lung cancer screening. You may have this screening every year starting at age 75 if you have a 30-pack-year history of smoking and currently smoke or have quit within the past 15 years.  Fecal occult blood test (FOBT) of the stool. You may have this test every year starting at age 19.  Flexible sigmoidoscopy or colonoscopy. You may have a sigmoidoscopy every 5 years or a colonoscopy every 10 years starting at age 5.  Hepatitis C blood test.  Hepatitis B blood test.  Sexually transmitted disease (STD) testing.  Diabetes screening. This is done by checking your blood sugar (glucose) after you have not eaten for a while (fasting). You may have this done every 1-3 years.  Bone density scan. This is done to screen for osteoporosis. You may have this done starting at age 60.  Mammogram. This may be done every 1-2 years. Talk to your health care provider about how often you should have regular mammograms. Talk with your health care provider about your test results, treatment options, and if necessary, the need for more tests. Vaccines  Your health care provider may recommend certain vaccines, such as:  Influenza vaccine. This is recommended every year.  Tetanus, diphtheria, and acellular pertussis (Tdap, Td) vaccine.  You may need a Td booster every 10 years.  Zoster vaccine. You may need this after age 22.  Pneumococcal 13-valent conjugate (PCV13) vaccine. One dose is recommended after age 76.  Pneumococcal polysaccharide (PPSV23) vaccine. One  dose is recommended after age 69. Talk to your health care provider about which screenings and vaccines you need and how often you need them. This information is not intended to replace advice given to you by your health care provider. Make sure you discuss any questions you have with your health care provider. Document Released: 12/04/2015 Document Revised: 07/27/2016 Document Reviewed: 09/08/2015 Elsevier Interactive Patient Education  2017 [place] Prevention in the Home Falls can cause injuries. They can happen to people of all ages. There are many things you can do to make your home safe and to help prevent falls. What can I do on the outside of my home?  Regularly fix the edges of walkways and driveways and fix any cracks.  Remove anything that might make you trip as you walk through a door, such as a raised step or threshold.  Trim any bushes or trees on the path to your home.  Use bright outdoor lighting.  Clear any walking paths of anything that might make someone trip, such as rocks or tools.  Regularly check to see if handrails are loose or broken. Make sure that both sides of any steps have handrails.  Any raised decks and porches should have guardrails on the edges.  Have any leaves, snow, or ice cleared regularly.  Use sand or salt on walking paths during winter.  Clean up any spills in your garage right away. This includes oil or grease spills. What can I do in the bathroom?  Use night lights.  Install grab bars by the toilet and in the tub and shower. Do not use towel bars as grab bars.  Use non-skid mats or decals in the tub or shower.  If you need to sit down in the shower, use a plastic, non-slip stool.  Keep the floor dry. Clean up any water that spills on the floor as soon as it happens.  Remove soap buildup in the tub or shower regularly.  Attach bath mats securely with double-sided non-slip rug tape.  Do not have throw rugs and other  things on the floor that can make you trip. What can I do in the bedroom?  Use night lights.  Make sure that you have a light by your bed that is easy to reach.  Do not use any sheets or blankets that are too big for your bed. They should not hang down onto the floor.  Have a firm chair that has side arms. You can use this for support while you get dressed.  Do not have throw rugs and other things on the floor that can make you trip. What can I do in the kitchen?  Clean up any spills right away.  Avoid walking on wet floors.  Keep items that you use a lot in easy-to-reach places.  If you need to reach something above you, use a strong step stool that has a grab bar.  Keep electrical cords out of the way.  Do not use floor polish or wax that makes floors slippery. If you must use wax, use non-skid floor wax.  Do not have throw rugs and other things on the floor that can make you trip. What can I do with my stairs?  Do not leave any  items on the stairs.  Make sure that there are handrails on both sides of the stairs and use them. Fix handrails that are broken or loose. Make sure that handrails are as long as the stairways.  Check any carpeting to make sure that it is firmly attached to the stairs. Fix any carpet that is loose or worn.  Avoid having throw rugs at the top or bottom of the stairs. If you do have throw rugs, attach them to the floor with carpet tape.  Make sure that you have a light switch at the top of the stairs and the bottom of the stairs. If you do not have them, ask someone to add them for you. What else can I do to help prevent falls?  Wear shoes that:  Do not have high heels.  Have rubber bottoms.  Are comfortable and fit you well.  Are closed at the toe. Do not wear sandals.  If you use a stepladder:  Make sure that it is fully opened. Do not climb a closed stepladder.  Make sure that both sides of the stepladder are locked into place.  Ask  someone to hold it for you, if possible.  Clearly mark and make sure that you can see:  Any grab bars or handrails.  First and last steps.  Where the edge of each step is.  Use tools that help you move around (mobility aids) if they are needed. These include:  Canes.  Walkers.  Scooters.  Crutches.  Turn on the lights when you go into a dark area. Replace any light bulbs as soon as they burn out.  Set up your furniture so you have a clear path. Avoid moving your furniture around.  If any of your floors are uneven, fix them.  If there are any pets around you, be aware of where they are.  Review your medicines with your doctor. Some medicines can make you feel dizzy. This can increase your chance of falling. Ask your doctor what other things that you can do to help prevent falls. This information is not intended to replace advice given to you by your health care provider. Make sure you discuss any questions you have with your health care provider. Document Released: 09/03/2009 Document Revised: 04/14/2016 Document Reviewed: 12/12/2014 Elsevier Interactive Patient Education  2017 [employer].

## 2018-08-15 NOTE — Progress Notes (Signed)
Subjective:   Cathy Dickerson is a 84 y.o. female who presents for Medicare Annual (Subsequent) preventive examination at [facility]  Last AWV-06/21/2017    Objective:     Vitals: BP 135/65 (BP Location: Left Arm, Patient Position: Sitting)   Pulse 60   Temp 97.9 F (36.6 C) (Oral)   Ht 5\' 3"  (1.6 m)   Wt 120 lb (54.4 kg)   BMI 21.26 kg/m   Body mass index is 21.26 kg/m.  Advanced Directives 08/15/2018 07/31/2018 03/06/2018 02/06/2018 09/28/2017 06/21/2017 04/17/2017  Does Patient Have a Medical Advance Directive? Yes Yes Yes Yes Yes Yes -  Type of Paramedic of [place];Living will;Out of facility DNR (pink MOST or yellow form) [place];Living will;Out of facility DNR (pink MOST or yellow form) [place];Living will;Out of facility DNR (pink MOST or yellow form) Out of facility DNR (pink MOST or yellow form);[place];Living will [place];Living will;Out of facility DNR (pink MOST or yellow form) [place];Living will [place]  Does patient want to make changes to medical advance directive? No - Patient declined No - Patient declined No - Patient declined No - Patient declined - No - Patient declined -  Copy of [place] in Chart? Yes Yes Yes Yes Yes Yes No - copy requested  Would patient like information on creating a medical advance directive? - - - - - - -  Pre-existing out of facility DNR order (yellow form or pink MOST form) Yellow form placed in chart (order not valid for inpatient use) Yellow form placed in chart (order not valid for inpatient use) Yellow form placed in chart (order not valid for inpatient use) Yellow form placed in chart (order not valid for inpatient use) Yellow form placed in chart (order not valid for inpatient use) - -    Tobacco Social History   Tobacco Use  Smoking Status Former Smoker    Smokeless Tobacco Never Used     Counseling given: Not Answered   Clinical Intake:  Pre-visit preparation completed: No  Pain : No/denies pain     Diabetes: No  How often do you need to have someone help you when you read instructions, pamphlets, or other written materials from your doctor or pharmacy?: 4 - Often What is the last grade level you completed in school?: Some college  Interpreter Needed?: No  Information entered by :: Tyson Dense, RN  Past Medical History:  Diagnosis Date  . Actinic keratoses   . Allergy   . Alzheimer disease 05/21/2015  . Anxiety   . Arrhythmia    atrial fib  . Atrial fibrillation ([place]) 12/2012  . Basal cell carcinoma of skin   . Cataracts, both eyes 2005, 2013   Cataract extraction/IOL implant  . Current use of long term anticoagulation 12/2012   Eliquis  . Depression   . Diverticulosis 2008  . Fracture of scaphoid bone of hand 08/05/2004  . History of UTI   . Hyperlipidemia   . Hypertension   . Hyponatremia    SIADH  . Low sodium levels   . Mild cognitive impairment   . Osteopenia   . Rosacea   . Squamous cell carcinoma   . Thyroid nodule    Past Surgical History:  Procedure Laterality Date  . ABDOMINAL HYSTERECTOMY  1970   TAH-BSO  . cataracts Bilateral 2/05 and 3/05  . CESAREAN SECTION  questionable   Family History  Problem Relation Age of Onset  . Breast cancer Mother   . Crohn's disease Mother   . Stroke Father 36  . Alzheimer's disease Sister   . Colon cancer Sister   . Colon polyps Sister   . Breast cancer Daughter        x 2  . Graves' disease Daughter   . Alcohol abuse Son   . Stroke Paternal Grandfather   . Graves' disease Daughter   . Breast cancer Daughter   . Breast cancer Daughter   . Kidney disease Neg Hx   . Liver disease Neg Hx   . Esophageal cancer Neg Hx   . Pancreatic cancer Neg Hx    Social History   Socioeconomic History  . Marital status: Married    Spouse name: Not on file   . Number of children: 3  . Years of education: Not on file  . Highest education level: Not on file  Occupational History  . Occupation: Retired  Scientific laboratory technician  . Financial resource strain: Not hard at all  . Food insecurity:    Worry: Never true    Inability: Never true  . Transportation needs:    Medical: No    Non-medical: No  Tobacco Use  . Smoking status: Former Research scientist (life sciences)  . Smokeless tobacco: Never Used  Substance and Sexual Activity  . Alcohol use: Yes    Alcohol/week: 4.0 - 5.0 standard drinks    Types: 4 - 5 Glasses of wine per week  . Drug use: No  . Sexual activity: Not on file  Lifestyle  . Physical activity:    Days per week: 0 days    Minutes per session: 0 min  . Stress: To some extent  Relationships  . Social connections:    Talks on phone: Not on file    Gets together: Not on file    Attends religious service: Never    Active member of club or organization: No    Attends meetings of clubs or organizations: Never    Relationship status: Married  Other Topics Concern  . Not on file  Social History Narrative   Patient is Married Research scientist (physical sciences)), homemaker. Lives in a single level home, Independent Living section at [facility] since 2013. Lives with spouse, pet dog ([place])   [place] smoking prior to 1980, No  Alcohol history   Patient has  no Advanced planning documents.      Diet?  Well balanced      Do you drink/eat things with caffeine? One 8 oz cup of coffee /day, usually 1/2 decaf      Marital status?         married                          What year were you married? 1997      Do you live in a house, apartment, assisted living, condo, trailer, etc.? [place], [place]      Is it one or more stories?  no      How many persons live in your home? two      Do you have any pets in your home? (please list) yes, one dog, Josie, [place]      Current or past profession: Audiological scientist, [employer]      Do  you exercise?       no  Type & how often?      Do you have a living will? Yes      Do you have a DNR form?       no                           If not, do you want to discuss one?  yes      Do you have signed POA/HPOA for forms?  yes    Outpatient Encounter Medications as of 08/15/2018  Medication Sig  . acetaminophen (TYLENOL) 325 MG tablet Take 325 mg by mouth every 6 (six) hours as needed.  Marland Kitchen apixaban (ELIQUIS) 2.5 MG TABS tablet Take 1 tablet (2.5 mg total) by mouth 2 (two) times daily.  . Calcium Carbonate-Vitamin D (CALCIUM 600+D PO) Take by mouth daily.  . carboxymethylcellulose (REFRESH PLUS) 0.5 % SOLN Place 1 drop into both eyes every 3 (three) hours.  . cholecalciferol (VITAMIN D) 1000 UNITS tablet Take 2,000 Units by mouth daily.  . magnesium hydroxide (MILK OF MAGNESIA) 400 MG/5ML suspension Take 20 mLs by mouth daily as needed for mild constipation.  . metoprolol succinate (TOPROL-XL) 25 MG 24 hr tablet Take 12.5 mg by mouth daily.  . mirtazapine (REMERON) 15 MG tablet Take 15 mg at bedtime by mouth.  . Multiple Vitamin (MULTIVITAMIN) tablet Take 1 tablet by mouth daily.  . polyethylene glycol (MIRALAX / GLYCOLAX) packet Take 17 g by mouth daily.   . predniSONE (DELTASONE) 20 MG tablet Take 20 mg by mouth 2 (two) times daily with a meal.  . saccharomyces boulardii (FLORASTOR) 250 MG capsule Take 250 mg by mouth daily.  . sodium chloride 1 G tablet Take 1 g by mouth 2 (two) times daily with a meal.  . trimethoprim (TRIMPEX) 100 MG tablet Take 100 mg by mouth daily.   No facility-administered encounter medications on file as of 08/15/2018.     Activities of Daily Living In your present state of health, do you have any difficulty performing the following activities: 08/15/2018  Hearing? N  Vision? N  Difficulty concentrating or making decisions? Y  Walking or climbing stairs? Y  Dressing or bathing? Y  Doing errands, shopping? Y  Preparing Food  and eating ? Y  Using the Toilet? Y  In the past six months, have you accidently leaked urine? Y  Do you have problems with loss of bowel control? Y  Managing your Medications? Y  Managing your Finances? Y  Housekeeping or managing your Housekeeping? Y  Some recent data might be hidden    Patient Care Team: [name], DO as PCP - General (Geriatric Medicine) [name], MD as Consulting Physician (Cardiology) Community, Well [name], MD as Consulting Physician (Gastroenterology) [name], [name], MD as Consulting Physician (Dermatology) [name], MD as Consulting Physician (Cardiology) [name], MD as Consulting Physician (Ophthalmology) Zimmer, [name], NP as Nurse Practitioner (Internal Medicine) [name], MD as Consulting Physician (Gynecology)    Assessment:   This is a routine wellness examination for [place].  Exercise Activities and Dietary recommendations Current Exercise Habits: The patient does not participate in regular exercise at present, Exercise limited by: neurologic condition(s)  Goals   None     Fall Risk Fall Risk  08/15/2018 07/12/2017 06/21/2017 05/04/2017 03/08/2017  Falls in the past year? No No No No No   Is the patient's home free of loose throw rugs in walkways,  pet beds, electrical cords, etc?   yes      Grab bars in the bathroom? yes      Handrails on the stairs?   yes      Adequate lighting?   yes  Depression Screen PHQ 2/9 Scores 08/15/2018 07/12/2017 06/21/2017 05/04/2017  PHQ - 2 Score 0 0 0 6  PHQ- 9 Score - - - 16     Cognitive Function MMSE - Mini Mental State Exam 08/15/2018 06/21/2017  Orientation to time 0 0  Orientation to Place 0 0  Registration 3 3  Attention/ Calculation 0 4  Recall 0 0  Language- name 2 objects 0 2  Language- repeat 1 1  Language- follow 3 step command 3 2  Language- read & follow direction 0 0  Write a sentence 0 0  Copy design 0 0  Total  score 7 12        Immunization History  Administered Date(s) Administered  . H1N1 12/04/2008  . Influenza-Unspecified 09/15/2003, 11/01/2010, 09/12/2011, 07/22/2016, 09/11/2017  . Pneumococcal Conjugate-13 04/14/2017  . Pneumococcal Polysaccharide-23 12/19/2008  . Td 09/15/2003, 01/06/2016  . Zoster Recombinat (Shingrix) 08/21/2017, 11/21/2017    Qualifies for Shingles Vaccine? Up to date, completed  Screening Tests Health Maintenance  Topic Date Due  . INFLUENZA VACCINE  06/21/2018  . TETANUS/TDAP  01/05/2026  . DEXA SCAN  Completed  . PNA vac Low Risk Adult  Completed    Cancer Screenings: Lung: Low Dose CT Chest recommended if Age 78-80 years, 30 pack-year currently smoking OR have quit w/in 15years. Patient does not qualify. Breast:  Up to date on Mammogram? Yes   Up to date of Bone Density/Dexa? Yes Colorectal: up to date  Additional Screenings:  Hepatitis C Screening: declined Flu vaccine due: will receive at [place]:    I have personally reviewed and addressed the Medicare Annual Wellness questionnaire and have noted the following in the patient's chart:  A. Medical and social history B. Use of alcohol, tobacco or illicit drugs  C. Current medications and supplements D. Functional ability and status E.  Nutritional status F.  Physical activity G. Advance directives H. List of other physicians I.  Hospitalizations, surgeries, and ER visits in previous 12 months J.  [place] to include hearing, vision, cognitive, depression L. Referrals and appointments - none  In addition, I have reviewed and discussed with patient certain preventive protocols, quality metrics, and best practice recommendations. A written personalized care plan for preventive services as well as general preventive health recommendations were provided to patient.  See attached scanned questionnaire for additional information.   Signed,   Tyson Dense, RN Nurse  Health Advisor  Patient Concerns: None

## 2018-08-17 DIAGNOSIS — N390 Urinary tract infection, site not specified: Secondary | ICD-10-CM | POA: Diagnosis not present

## 2018-08-17 DIAGNOSIS — N39498 Other specified urinary incontinence: Secondary | ICD-10-CM | POA: Diagnosis not present

## 2018-08-17 DIAGNOSIS — Z79899 Other long term (current) drug therapy: Secondary | ICD-10-CM | POA: Diagnosis not present

## 2018-08-17 DIAGNOSIS — E708 Other disorders of aromatic amino-acid metabolism: Secondary | ICD-10-CM | POA: Diagnosis not present

## 2018-08-20 ENCOUNTER — Ambulatory Visit: Payer: Medicare Other

## 2018-08-28 ENCOUNTER — Encounter: Payer: Self-pay | Admitting: Internal Medicine

## 2018-08-28 ENCOUNTER — Non-Acute Institutional Stay: Payer: Medicare Other | Admitting: Internal Medicine

## 2018-08-28 DIAGNOSIS — K5901 Slow transit constipation: Secondary | ICD-10-CM

## 2018-08-28 DIAGNOSIS — F321 Major depressive disorder, single episode, moderate: Secondary | ICD-10-CM | POA: Diagnosis not present

## 2018-08-28 DIAGNOSIS — M8589 Other specified disorders of bone density and structure, multiple sites: Secondary | ICD-10-CM

## 2018-08-28 DIAGNOSIS — I482 Chronic atrial fibrillation, unspecified: Secondary | ICD-10-CM | POA: Diagnosis not present

## 2018-08-28 DIAGNOSIS — G301 Alzheimer's disease with late onset: Secondary | ICD-10-CM

## 2018-08-28 DIAGNOSIS — M25461 Effusion, right knee: Secondary | ICD-10-CM

## 2018-08-28 DIAGNOSIS — F0281 Dementia in other diseases classified elsewhere with behavioral disturbance: Secondary | ICD-10-CM | POA: Diagnosis not present

## 2018-08-28 DIAGNOSIS — F02818 Dementia in other diseases classified elsewhere, unspecified severity, with other behavioral disturbance: Secondary | ICD-10-CM

## 2018-08-28 DIAGNOSIS — I10 Essential (primary) hypertension: Secondary | ICD-10-CM | POA: Diagnosis not present

## 2018-08-28 NOTE — Progress Notes (Signed)
Patient ID: Cathy Dickerson, female   DOB: 1926-12-07, 84 y.o.   MRN: [id number]  Location:  [place] Room Number: 725 memory care Place of Service:  ALF [id number]) Provider:   [name], DO  Patient Care Team: [name], DO as PCP - General (Geriatric Medicine) [name], MD as Consulting Physician (Cardiology) Community, Well [name], MD as Consulting Physician (Gastroenterology) [name], [name], MD as Consulting Physician (Dermatology) [name], MD as Consulting Physician (Cardiology) [name], MD as Consulting Physician (Ophthalmology) Zimmer, [name], NP as Nurse Practitioner (Internal Medicine) [name], MD as Consulting Physician (Gynecology)  Extended Emergency Contact Information Primary Emergency Contact: Stones,[name] Address: [address]          [place], [place] [address] [name] of [place] Phone: [phone] Work Phone: [phone] Mobile Phone: [phone] Relation: Spouse  Code Status:  DNR Goals of care: Advanced Directive information Advanced Directives 08/28/2018  Does Patient Have a Medical Advance Directive? Yes  Type of Paramedic of [place];Living will;Out of facility DNR (pink MOST or yellow form)  Does patient want to make changes to medical advance directive? No - Patient declined  Copy of [place] in Chart? Yes  Would patient like information on creating a medical advance directive? -  Pre-existing out of facility DNR order (yellow form or pink MOST form) Yellow form placed in chart (order not valid for inpatient use)   Chief Complaint  Patient presents with  . Medical Management of Chronic Issues    Routine visit    HPI:  Pt is a 84 y.o. female seen today for medical management of chronic diseases.    Pt seen and examined in her room in memory care.  Her husband was  visiting and they were taking a nap when I knocked on the door.  Her memory loss continues to progress and she still has times she does not know him.  She still thinks she can go home at times.    She admits to still having some swelling and discomfort in her right knee.  She continues to ambulate with her rollator walker.    Her afib has remained rate controlled and she is tolerating eliquis without bleeding or bruising.  She remains on trimpex to prevent UTIs from her urologist.    Constipation:  Managed with her miralax daily.  On florastor.  For depression and insomnia: she stays on remeron and does fine.  Weight stable at 120 lbs.    Hyponatremia:  On sodium chloride 1g bid.  Past Medical History:  Diagnosis Date  . Actinic keratoses   . Allergy   . Alzheimer disease ([place]) 05/21/2015  . Anxiety   . Arrhythmia    atrial fib  . Atrial fibrillation ([place]) 12/2012  . Basal cell carcinoma of skin   . Cataracts, both eyes 2005, 2013   Cataract extraction/IOL implant  . Current use of long term anticoagulation 12/2012   Eliquis  . Depression   . Diverticulosis 2008  . Fracture of scaphoid bone of hand 08/05/2004  . History of UTI   . Hyperlipidemia   . Hypertension   . Hyponatremia    SIADH  . Low sodium levels   . Mild cognitive impairment   . Osteopenia   . Rosacea   . Squamous cell carcinoma   . Thyroid nodule    Past Surgical History:  Procedure Laterality Date  .  ABDOMINAL HYSTERECTOMY  1970   TAH-BSO  . cataracts Bilateral 2/05 and 3/05  . CESAREAN SECTION     questionable    Allergies  Allergen Reactions  . Solifenacin Other (See Comments)  . Ciprofloxacin Other (See Comments)    Reaction unknown  . Latex Other (See Comments)    Reaction unknown  . Macrobid [Nitrofurantoin Macrocrystal] Nausea And Vomiting  . Macrodantin [Nitrofurantoin] Nausea Only    Upset stomach  . Other Other (See Comments)    Popcorn- Diverticulosis  . Strawberry Extract Other  (See Comments)    Diverticulosis   . Tramadol Nausea Only    Outpatient Encounter Medications as of 08/28/2018  Medication Sig  . acetaminophen (TYLENOL) 325 MG tablet Take 325 mg by mouth every 6 (six) hours as needed.  Marland Kitchen apixaban (ELIQUIS) 2.5 MG TABS tablet Take 1 tablet (2.5 mg total) by mouth 2 (two) times daily.  . Calcium Carbonate-Vitamin D (CALCIUM 600+D PO) Take by mouth daily.  . carboxymethylcellulose (REFRESH PLUS) 0.5 % SOLN Place 1 drop into both eyes every 3 (three) hours.  . cholecalciferol (VITAMIN D) 1000 UNITS tablet Take 2,000 Units by mouth daily.  Marland Kitchen ibuprofen (ADVIL,MOTRIN) 200 MG tablet Take 400 mg by mouth every 8 (eight) hours as needed.  . magnesium hydroxide (MILK OF MAGNESIA) 400 MG/5ML suspension Take 20 mLs by mouth daily as needed for mild constipation.  . metoprolol succinate (TOPROL-XL) 25 MG 24 hr tablet Take 12.5 mg by mouth daily.  . mirtazapine (REMERON) 15 MG tablet Take 15 mg at bedtime by mouth.  . Multiple Vitamin (MULTIVITAMIN) tablet Take 1 tablet by mouth daily.  . polyethylene glycol (MIRALAX / GLYCOLAX) packet Take 17 g by mouth daily.   Marland Kitchen saccharomyces boulardii (FLORASTOR) 250 MG capsule Take 250 mg by mouth daily.  . sodium chloride 1 G tablet Take 1 g by mouth 2 (two) times daily with a meal.  . trimethoprim (TRIMPEX) 100 MG tablet Take 100 mg by mouth daily.  Dema Severin Petrolatum-Mineral Oil (SYSTANE NIGHTTIME) OINT Place into both eyes at bedtime as needed.  . [DISCONTINUED] predniSONE (DELTASONE) 20 MG tablet Take 20 mg by mouth 2 (two) times daily with a meal.   No facility-administered encounter medications on file as of 08/28/2018.     Review of Systems  Constitutional: Negative for activity change, appetite change, chills and fever.  HENT: Negative for congestion and trouble swallowing.   Eyes: Positive for visual disturbance.  Respiratory: Negative for chest tightness and shortness of breath.   Cardiovascular: Negative for chest  pain and leg swelling.  Gastrointestinal: Negative for abdominal pain, constipation, diarrhea and nausea.  Genitourinary: Negative for dysuria.  Musculoskeletal: Positive for gait problem and joint swelling. Negative for arthralgias and back pain.  Skin: Negative for color change.  Neurological: Negative for dizziness and weakness.  Psychiatric/Behavioral: Positive for confusion. Negative for agitation, behavioral problems and sleep disturbance.    Immunization History  Administered Date(s) Administered  . H1N1 12/04/2008  . Influenza-Unspecified 09/15/2003, 11/01/2010, 09/12/2011, 07/22/2016, 09/11/2017  . Pneumococcal Conjugate-13 04/14/2017  . Pneumococcal Polysaccharide-23 12/19/2008  . Td 09/15/2003, 01/06/2016  . Zoster Recombinat (Shingrix) 08/21/2017, 11/21/2017   Pertinent  Health Maintenance Due  Topic Date Due  . INFLUENZA VACCINE  06/21/2018  . DEXA SCAN  Completed  . PNA vac Low Risk Adult  Completed   Fall Risk  08/15/2018 07/12/2017 06/21/2017 05/04/2017 03/08/2017  Falls in the past year? No No No No No   Functional Status Survey:  Vitals:   08/28/18 1437  BP: (!) 165/87  Pulse: (!) 57  Resp: 19  Temp: (!) 96.8 F (36 C)  TempSrc: Oral  SpO2: 98%  Weight: 120 lb (54.4 kg)  Height: 5\' 3"  (1.6 m)   Body mass index is 21.26 kg/m. Physical Exam  Constitutional: She appears well-developed. No distress.  Eyes:  Does not see well  Cardiovascular: Intact distal pulses.  irreg irreg  Pulmonary/Chest: Effort normal and breath sounds normal. No respiratory distress.  Abdominal: Soft. Bowel sounds are normal.  Musculoskeletal: Normal range of motion.  Effusion right knee with warmth, mild tenderness, walks with rollator walker  Neurological: She is alert.  Oriented to person currently, knows Trip is her husband   Skin: Skin is warm and dry. Capillary refill takes less than 2 seconds.  Psychiatric: She has a normal mood and affect.    Labs reviewed: Recent  Labs    12/25/17 0600 07/02/18 0600  NA 136* 136*  K 4.7 4.6  BUN 21 18  CREATININE 0.9 0.9   No results for input(s): AST, ALT, ALKPHOS, BILITOT, PROT, ALBUMIN in the last 8760 hours. Recent Labs    12/25/17 0600 02/06/18 0700  WBC 5.9 11.1  HGB 13.5 14.3  HCT 42 43  PLT 220 220   Lab Results  Component Value Date   TSH 1.64 12/25/2017   Assessment/Plan 1. Late onset Alzheimer's disease with behavioral disturbance ([place]) -progressing, cont memory care support  2. Osteopenia of multiple sites -cont ca with D and additional D3, recommend f/u bone density   3. Effusion of right knee -may use ice on it and topical also for pain, cont tylenol and rare ibuprofen ok up to a few times per week  4. Essential hypertension -bp typically controlled when checked manually but electronic cuff not accurate, cont toprol xl  5. Chronic atrial fibrillation -rate controlled with toprol xl and anticoagulated well with eliquis  6. Slow transit constipation -cont miralax which has been effective  7. Depression, major, single episode, moderate ([place]) -doing better lately with remeron and memory care support  Family/ staff Communication: discussed with memory care nurse and pt's husband  Labs/tests ordered:  No new  Christofer Shen L. Franchesca Veneziano, D.O. [facility] [address] [place],  [address] Cell Phone (Mon-Fri 8am-5pm):  [phone] On Call:  [phone] & follow prompts after 5pm & weekends Office Phone:  [phone] Office Fax:  [phone]

## 2018-09-18 ENCOUNTER — Encounter: Payer: Self-pay | Admitting: Internal Medicine

## 2018-09-20 DIAGNOSIS — Z23 Encounter for immunization: Secondary | ICD-10-CM | POA: Diagnosis not present

## 2018-09-24 DIAGNOSIS — H04123 Dry eye syndrome of bilateral lacrimal glands: Secondary | ICD-10-CM | POA: Diagnosis not present

## 2018-10-18 DIAGNOSIS — E039 Hypothyroidism, unspecified: Secondary | ICD-10-CM | POA: Diagnosis not present

## 2018-10-18 DIAGNOSIS — R5381 Other malaise: Secondary | ICD-10-CM | POA: Diagnosis not present

## 2018-10-18 LAB — TSH: TSH: 2.29 (ref 0.41–5.90)

## 2018-10-26 DIAGNOSIS — H04123 Dry eye syndrome of bilateral lacrimal glands: Secondary | ICD-10-CM | POA: Diagnosis not present

## 2018-10-29 ENCOUNTER — Non-Acute Institutional Stay: Payer: Medicare Other | Admitting: Adult Health

## 2018-10-29 DIAGNOSIS — G301 Alzheimer's disease with late onset: Secondary | ICD-10-CM

## 2018-10-29 DIAGNOSIS — F321 Major depressive disorder, single episode, moderate: Secondary | ICD-10-CM | POA: Diagnosis not present

## 2018-10-29 DIAGNOSIS — E871 Hypo-osmolality and hyponatremia: Secondary | ICD-10-CM | POA: Diagnosis not present

## 2018-10-29 DIAGNOSIS — R269 Unspecified abnormalities of gait and mobility: Secondary | ICD-10-CM | POA: Diagnosis not present

## 2018-10-29 DIAGNOSIS — I10 Essential (primary) hypertension: Secondary | ICD-10-CM

## 2018-10-29 DIAGNOSIS — F0281 Dementia in other diseases classified elsewhere with behavioral disturbance: Secondary | ICD-10-CM

## 2018-10-29 DIAGNOSIS — F02818 Dementia in other diseases classified elsewhere, unspecified severity, with other behavioral disturbance: Secondary | ICD-10-CM

## 2018-10-29 DIAGNOSIS — I480 Paroxysmal atrial fibrillation: Secondary | ICD-10-CM | POA: Diagnosis not present

## 2018-10-29 DIAGNOSIS — Z87440 Personal history of urinary (tract) infections: Secondary | ICD-10-CM

## 2018-10-30 DIAGNOSIS — H04123 Dry eye syndrome of bilateral lacrimal glands: Secondary | ICD-10-CM | POA: Diagnosis not present

## 2018-10-31 ENCOUNTER — Encounter: Payer: Self-pay | Admitting: Adult Health

## 2018-10-31 NOTE — Progress Notes (Signed)
Location:  Occupational psychologist of Service:  ALF (13) Provider:   [name], ANP [place] [phone]   [name], DO  Patient Care Team: [name], DO as PCP - General (Geriatric Medicine) [name], MD as Consulting Physician (Cardiology) Community, Well [name], MD as Consulting Physician (Gastroenterology) [name], [name], MD as Consulting Physician (Dermatology) [name], MD as Consulting Physician (Cardiology) [name], MD as Consulting Physician (Ophthalmology) Zimmer, [name], NP as Nurse Practitioner (Internal Medicine) [name], MD as Consulting Physician (Gynecology)  Extended Emergency Contact Information Primary Emergency Contact: [facility] Address: [address]          [place], [place] [address] [name] of [place] Phone: [phone] Work Phone: [phone] Mobile Phone: [phone] Relation: Spouse  Code Status:  DNR Goals of care: Advanced Directive information Advanced Directives 08/28/2018  Does Patient Have a Medical Advance Directive? Yes  Type of Paramedic of [place];Living will;Out of facility DNR (pink MOST or yellow form)  Does patient want to make changes to medical advance directive? No - Patient declined  Copy of [place] in Chart? Yes  Would patient like information on creating a medical advance directive? -  Pre-existing out of facility DNR order (yellow form or pink MOST form) Yellow form placed in chart (order not valid for inpatient use)     Chief Complaint  Patient presents with  . Medical Management of Chronic Issues    HPI:  Pt is a 84 y.o. female seen today for medical management of chronic diseases.    AD: staff report that she is showing signs of worsening memory loss and disorientation. In the evening she does not recognize her husband.     Depression: no signs of decreased appetite, crying episodes, etc Does have signs of anxiety about her schedule, location  UTI: recurrent, on trimethoprim No recent symptoms  HTN: BP 130-140's  Afib: weight stable, no reports of sob, cp, palpitations  Hyponatremia: on sodium tabs NA 136 in Aug  Functional status: ambulatory with a walker, intermittently incontinent Past Medical History:  Diagnosis Date  . Actinic keratoses   . Allergy   . Alzheimer disease ([place]) 05/21/2015  . Anxiety   . Arrhythmia    atrial fib  . Atrial fibrillation ([place]) 12/2012  . Basal cell carcinoma of skin   . Cataracts, both eyes 2005, 2013   Cataract extraction/IOL implant  . Current use of long term anticoagulation 12/2012   Eliquis  . Depression   . Diverticulosis 2008  . Fracture of scaphoid bone of hand 08/05/2004  . History of UTI   . Hyperlipidemia   . Hypertension   . Hyponatremia    SIADH  . Low sodium levels   . Mild cognitive impairment   . Osteopenia   . Rosacea   . Squamous cell carcinoma   . Thyroid nodule    Past Surgical History:  Procedure Laterality Date  . ABDOMINAL HYSTERECTOMY  1970   TAH-BSO  . cataracts Bilateral 2/05 and 3/05  . CESAREAN SECTION     questionable    Allergies  Allergen Reactions  . Solifenacin Other (See Comments)  . Ciprofloxacin Other (See Comments)    Reaction unknown  . Latex Other (See Comments)    Reaction unknown  . Macrobid [Nitrofurantoin Macrocrystal] Nausea And Vomiting  . Macrodantin [Nitrofurantoin] Nausea Only    Upset stomach  .  Other Other (See Comments)    Popcorn- Diverticulosis  . Strawberry Extract Other (See Comments)    Diverticulosis   . Tramadol Nausea Only    Outpatient Encounter Medications as of 10/29/2018  Medication Sig  . acetaminophen (TYLENOL) 325 MG tablet Take 325 mg by mouth every 6 (six) hours as needed.  Marland Kitchen apixaban (ELIQUIS) 2.5 MG TABS tablet Take 1 tablet (2.5 mg total) by mouth 2 (two) times  daily.  . Calcium Carbonate-Vitamin D (CALCIUM 600+D PO) Take by mouth daily.  . carboxymethylcellulose (REFRESH PLUS) 0.5 % SOLN Place 1 drop into both eyes every 3 (three) hours.  . cholecalciferol (VITAMIN D) 1000 UNITS tablet Take 2,000 Units by mouth daily.  Marland Kitchen ibuprofen (ADVIL,MOTRIN) 200 MG tablet Take 400 mg by mouth every 8 (eight) hours as needed.  . magnesium hydroxide (MILK OF MAGNESIA) 400 MG/5ML suspension Take 20 mLs by mouth daily as needed for mild constipation.  . metoprolol succinate (TOPROL-XL) 25 MG 24 hr tablet Take 12.5 mg by mouth daily.  . mirtazapine (REMERON) 15 MG tablet Take 15 mg at bedtime by mouth.  . Multiple Vitamin (MULTIVITAMIN) tablet Take 1 tablet by mouth daily.  . polyethylene glycol (MIRALAX / GLYCOLAX) packet Take 17 g by mouth daily. 1/2 dose daily  . saccharomyces boulardii (FLORASTOR) 250 MG capsule Take 250 mg by mouth daily.  . sodium chloride 1 G tablet Take 1 g by mouth 2 (two) times daily with a meal.  . trimethoprim (TRIMPEX) 100 MG tablet Take 100 mg by mouth daily.  Dema Severin Petrolatum-Mineral Oil (SYSTANE NIGHTTIME) OINT Place into both eyes at bedtime as needed.   No facility-administered encounter medications on file as of 10/29/2018.     Review of Systems  Unable to perform ROS: Dementia    Immunization History  Administered Date(s) Administered  . H1N1 12/04/2008  . Influenza,inj,Quad PF,6+ Mos 09/11/2018  . Influenza-Unspecified 09/15/2003, 11/01/2010, 09/12/2011, 07/22/2016, 09/11/2017  . Pneumococcal Conjugate-13 04/14/2017  . Pneumococcal Polysaccharide-23 12/19/2008  . Td 09/15/2003, 01/06/2016  . Zoster Recombinat (Shingrix) 08/21/2017, 11/21/2017   Pertinent  Health Maintenance Due  Topic Date Due  . INFLUENZA VACCINE  Completed  . DEXA SCAN  Completed  . PNA vac Low Risk Adult  Completed   Fall Risk  08/15/2018 07/12/2017 06/21/2017 05/04/2017 03/08/2017  Falls in the past year? No No No No No   Functional Status  Survey:    Vitals:   10/31/18 0837  Weight: 124 lb 12.8 oz (56.6 kg)   Body mass index is 22.11 kg/m. Physical Exam  Constitutional: No distress.  HENT:  Head: Normocephalic and atraumatic.  Neck: No JVD present.  Cardiovascular: Normal rate.  No murmur heard. Irreg, trace edema to both ankles  Pulmonary/Chest: Effort normal and breath sounds normal. No respiratory distress. She has no wheezes.  Abdominal: Soft. Bowel sounds are normal.  Neurological: She is alert.  Oriented to self only  Skin: Skin is warm and dry. She is not diaphoretic.  Psychiatric: She has a normal mood and affect.  Nursing note and vitals reviewed.   Labs reviewed: Recent Labs    12/25/17 0600 07/02/18 0600  NA 136* 136*  K 4.7 4.6  BUN 21 18  CREATININE 0.9 0.9   No results for input(s): AST, ALT, ALKPHOS, BILITOT, PROT, ALBUMIN in the last 8760 hours. Recent Labs    12/25/17 0600 02/06/18 0700  WBC 5.9 11.1  HGB 13.5 14.3  HCT 42 43  PLT 220 220  Lab Results  Component Value Date   TSH 2.29 10/18/2018   No results found for: HGBA1C No results found for: CHOL, HDL, LDLCALC, LDLDIRECT, TRIG, CHOLHDL  Significant Diagnostic Results in last 30 days:  No results found.  Assessment/Plan  1. Essential hypertension Controlled  2. Late onset Alzheimer's disease with behavioral disturbance ([place]) Progressively more confused and having difficulty recognizing her husband  Continue supportive care in the memory unit  3. Paroxysmal atrial fibrillation (HCC) Rate controlled with Toprol Xl 12.5 mg qd Continue Eliquis for CVA risk reduction   4. History of recurrent UTIs Continue trimethoprim 100 mg qd   5. Gait abnormality Uses walker for short distances and WC for long distances  6. Hyponatremia Unchanged Continue to monitor Na and intake/output  7. Depression, major, single episode, moderate (HCC) Periods of anxiety are noted, would not taper Remeron    Family/ staff  Communication: staff  Labs/tests ordered:  NA

## 2018-11-26 DIAGNOSIS — Z85828 Personal history of other malignant neoplasm of skin: Secondary | ICD-10-CM | POA: Diagnosis not present

## 2018-11-26 DIAGNOSIS — L821 Other seborrheic keratosis: Secondary | ICD-10-CM | POA: Diagnosis not present

## 2018-11-26 DIAGNOSIS — D225 Melanocytic nevi of trunk: Secondary | ICD-10-CM | POA: Diagnosis not present

## 2018-12-03 DIAGNOSIS — H04123 Dry eye syndrome of bilateral lacrimal glands: Secondary | ICD-10-CM | POA: Diagnosis not present

## 2018-12-10 DIAGNOSIS — H16122 Filamentary keratitis, left eye: Secondary | ICD-10-CM | POA: Diagnosis not present

## 2018-12-10 DIAGNOSIS — H16121 Filamentary keratitis, right eye: Secondary | ICD-10-CM | POA: Diagnosis not present

## 2018-12-14 DIAGNOSIS — T1502XA Foreign body in cornea, left eye, initial encounter: Secondary | ICD-10-CM | POA: Diagnosis not present

## 2018-12-14 DIAGNOSIS — H16122 Filamentary keratitis, left eye: Secondary | ICD-10-CM | POA: Diagnosis not present

## 2018-12-14 DIAGNOSIS — H16121 Filamentary keratitis, right eye: Secondary | ICD-10-CM | POA: Diagnosis not present

## 2018-12-14 DIAGNOSIS — T1501XA Foreign body in cornea, right eye, initial encounter: Secondary | ICD-10-CM | POA: Diagnosis not present

## 2018-12-18 DIAGNOSIS — H16121 Filamentary keratitis, right eye: Secondary | ICD-10-CM | POA: Diagnosis not present

## 2018-12-24 DIAGNOSIS — H16123 Filamentary keratitis, bilateral: Secondary | ICD-10-CM | POA: Diagnosis not present

## 2018-12-31 ENCOUNTER — Encounter: Payer: Self-pay | Admitting: Adult Health

## 2018-12-31 ENCOUNTER — Non-Acute Institutional Stay: Payer: Medicare Other | Admitting: Adult Health

## 2018-12-31 DIAGNOSIS — M8589 Other specified disorders of bone density and structure, multiple sites: Secondary | ICD-10-CM

## 2018-12-31 DIAGNOSIS — E871 Hypo-osmolality and hyponatremia: Secondary | ICD-10-CM | POA: Diagnosis not present

## 2018-12-31 DIAGNOSIS — F0281 Dementia in other diseases classified elsewhere with behavioral disturbance: Secondary | ICD-10-CM

## 2018-12-31 DIAGNOSIS — Z7901 Long term (current) use of anticoagulants: Secondary | ICD-10-CM | POA: Diagnosis not present

## 2018-12-31 DIAGNOSIS — R269 Unspecified abnormalities of gait and mobility: Secondary | ICD-10-CM

## 2018-12-31 DIAGNOSIS — I480 Paroxysmal atrial fibrillation: Secondary | ICD-10-CM | POA: Diagnosis not present

## 2018-12-31 DIAGNOSIS — G301 Alzheimer's disease with late onset: Secondary | ICD-10-CM

## 2018-12-31 DIAGNOSIS — I10 Essential (primary) hypertension: Secondary | ICD-10-CM | POA: Diagnosis not present

## 2018-12-31 DIAGNOSIS — K5901 Slow transit constipation: Secondary | ICD-10-CM | POA: Diagnosis not present

## 2019-01-02 DIAGNOSIS — H16123 Filamentary keratitis, bilateral: Secondary | ICD-10-CM | POA: Diagnosis not present

## 2019-01-02 DIAGNOSIS — T1502XA Foreign body in cornea, left eye, initial encounter: Secondary | ICD-10-CM | POA: Diagnosis not present

## 2019-01-02 NOTE — Progress Notes (Signed)
Location:  Occupational psychologist of Service:  ALF (13) Provider:   [name], ANP [place] [phone]   [name], DO  Patient Care Team: [name], DO as PCP - General (Geriatric Medicine) [name], MD as Consulting Physician (Cardiology) Community, Well [name], MD as Consulting Physician (Gastroenterology) [name], [name], MD as Consulting Physician (Dermatology) [name], MD as Consulting Physician (Cardiology) [name], MD as Consulting Physician (Ophthalmology) Zimmer, [name], NP as Nurse Practitioner (Internal Medicine) [name], MD as Consulting Physician (Gynecology)  Extended Emergency Contact Information Primary Emergency Contact: [facility] Address: [address]          [place], [place] [address] [name] of [place] Phone: [phone] Work Phone: [phone] Mobile Phone: [phone] Relation: Spouse  Code Status:  DNR Goals of care: Advanced Directive information Advanced Directives 08/28/2018  Does Patient Have a Medical Advance Directive? Yes  Type of Paramedic of [place];Living will;Out of facility DNR (pink MOST or yellow form)  Does patient want to make changes to medical advance directive? No - Patient declined  Copy of [place] in Chart? Yes  Would patient like information on creating a medical advance directive? -  Pre-existing out of facility DNR order (yellow form or pink MOST form) Yellow form placed in chart (order not valid for inpatient use)     Chief Complaint  Patient presents with  . Medical Management of Chronic Issues    HPI:  Pt is a 84 y.o. female seen today for medical management of chronic diseases.    AD: Nsg notes indicate occasional periods of resistance to care or change of clothes but no physical aggression. No reports of delusions or  hallucinations but she at times forgets who her husband is or where is she living currently.   Afib: weight stable, no reports of sob, cp, palpitations  Hyponatremia: on sodium tabs NA 136 in Aug   No issues with constipation, diarrhea, or abd pain.   Recently treated for filament keratitis. Eyes with less irritation and discomfort for my visit on current eye drops recommended by ophthalmology Her husband indicated in 2019 that he did not intend to send her for any further screening test given her advanced dementia such as mammograms  Functional status: ambulatory with a walker, intermittently incontinent Past Medical History:  Diagnosis Date  . Actinic keratoses   . Allergy   . Alzheimer disease ([place]) 05/21/2015  . Anxiety   . Arrhythmia    atrial fib  . Atrial fibrillation ([place]) 12/2012  . Basal cell carcinoma of skin   . Cataracts, both eyes 2005, 2013   Cataract extraction/IOL implant  . Current use of long term anticoagulation 12/2012   Eliquis  . Depression   . Diverticulosis 2008  . Fracture of scaphoid bone of hand 08/05/2004  . History of UTI   . Hyperlipidemia   . Hypertension   . Hyponatremia    SIADH  . Low sodium levels   . Mild cognitive impairment   . Osteopenia   . Rosacea   . Squamous cell carcinoma   . Thyroid nodule    Past Surgical History:  Procedure Laterality Date  . ABDOMINAL HYSTERECTOMY  1970   TAH-BSO  . cataracts Bilateral 2/05 and 3/05  . CESAREAN SECTION     questionable    Allergies  Allergen Reactions  . Solifenacin Other (See Comments)  .  Ciprofloxacin Other (See Comments)    Reaction unknown  . Latex Other (See Comments)    Reaction unknown  . Macrobid [Nitrofurantoin Macrocrystal] Nausea And Vomiting  . Macrodantin [Nitrofurantoin] Nausea Only    Upset stomach  . Other Other (See Comments)    Popcorn- Diverticulosis  . Strawberry Extract Other (See Comments)    Diverticulosis   . Tramadol Nausea Only    Outpatient  Encounter Medications as of 12/31/2018  Medication Sig  . acetaminophen (TYLENOL) 325 MG tablet Take 325 mg by mouth every 6 (six) hours as needed.  Marland Kitchen apixaban (ELIQUIS) 2.5 MG TABS tablet Take 1 tablet (2.5 mg total) by mouth 2 (two) times daily.  . Calcium Carbonate-Vitamin D (CALCIUM 600+D PO) Take by mouth daily.  . cholecalciferol (VITAMIN D) 1000 UNITS tablet Take 2,000 Units by mouth daily.  . magnesium hydroxide (MILK OF MAGNESIA) 400 MG/5ML suspension Take 20 mLs by mouth daily as needed for mild constipation.  . metoprolol succinate (TOPROL-XL) 25 MG 24 hr tablet Take 12.5 mg by mouth daily.  . mirtazapine (REMERON) 15 MG tablet Take 15 mg at bedtime by mouth.  . Multiple Vitamin (MULTIVITAMIN) tablet Take 1 tablet by mouth daily.  . NON FORMULARY Place 1 drop into both eyes every 4 (four) hours while awake. N-acetylcysteine drops  . NON FORMULARY Place 1 drop into both eyes every 4 (four) hours while awake. Administers 1 hr after n acetylcysteine  . NON FORMULARY Place 1 drop into both eyes daily. Avelox 0.5%  . polyethylene glycol (MIRALAX / GLYCOLAX) packet Take 17 g by mouth daily. 1/2 dose daily  . saccharomyces boulardii (FLORASTOR) 250 MG capsule Take 250 mg by mouth daily.  . sodium chloride 1 G tablet Take 1 g by mouth 2 (two) times daily with a meal.  . trimethoprim (TRIMPEX) 100 MG tablet Take 100 mg by mouth daily.  . [DISCONTINUED] White Petrolatum-Mineral Oil (SYSTANE NIGHTTIME) OINT Place into both eyes at bedtime as needed.  . [DISCONTINUED] carboxymethylcellulose (REFRESH PLUS) 0.5 % SOLN Place 1 drop into both eyes every 3 (three) hours.  . [DISCONTINUED] ibuprofen (ADVIL,MOTRIN) 200 MG tablet Take 400 mg by mouth every 8 (eight) hours as needed.   No facility-administered encounter medications on file as of 12/31/2018.     Review of Systems  Unable to perform ROS: Dementia    Immunization History  Administered Date(s) Administered  . H1N1 12/04/2008  .  Influenza,inj,Quad PF,6+ Mos 09/11/2018  . Influenza-Unspecified 09/15/2003, 11/01/2010, 09/12/2011, 07/22/2016, 09/11/2017  . Pneumococcal Conjugate-13 04/14/2017  . Pneumococcal Polysaccharide-23 12/19/2008  . Td 09/15/2003, 01/06/2016  . Zoster Recombinat (Shingrix) 08/21/2017, 11/21/2017   Pertinent  Health Maintenance Due  Topic Date Due  . INFLUENZA VACCINE  Completed  . DEXA SCAN  Completed  . PNA vac Low Risk Adult  Completed   Fall Risk  08/15/2018 07/12/2017 06/21/2017 05/04/2017 03/08/2017  Falls in the past year? No No No No No   Functional Status Survey:    There were no vitals filed for this visit. There is no height or weight on file to calculate BMI. Physical Exam Vitals signs and nursing note reviewed.  Constitutional:      General: She is not in acute distress.    Appearance: She is not diaphoretic.  HENT:     Head: Normocephalic and atraumatic.  Eyes:     General:        Right eye: No discharge.        Left eye: No  discharge.     Conjunctiva/sclera: Conjunctivae normal.     Pupils: Pupils are equal, round, and reactive to light.  Neck:     Vascular: No JVD.  Cardiovascular:     Rate and Rhythm: Normal rate. Rhythm irregular.     Heart sounds: No murmur.     Comments: Trace edema to ankles Pulmonary:     Effort: Pulmonary effort is normal. No respiratory distress.     Breath sounds: Normal breath sounds. No wheezing.  Abdominal:     General: Bowel sounds are normal.     Palpations: Abdomen is soft.  Musculoskeletal:        General: No swelling, tenderness, deformity or signs of injury.  Lymphadenopathy:     Cervical: No cervical adenopathy.  Skin:    General: Skin is warm and dry.  Neurological:     General: No focal deficit present.     Mental Status: She is alert. Mental status is at baseline.     Cranial Nerves: No cranial nerve deficit.     Comments: Oriented to self only  Psychiatric:        Mood and Affect: Mood normal.     Labs  reviewed: Recent Labs    07/02/18 0600  NA 136*  K 4.6  BUN 18  CREATININE 0.9   No results for input(s): AST, ALT, ALKPHOS, BILITOT, PROT, ALBUMIN in the last 8760 hours. Recent Labs    02/06/18 0700  WBC 11.1  HGB 14.3  HCT 43  PLT 220   Lab Results  Component Value Date   TSH 2.29 10/18/2018   No results found for: HGBA1C No results found for: CHOL, HDL, LDLCALC, LDLDIRECT, TRIG, CHOLHDL  Significant Diagnostic Results in last 30 days:  No results found.  Assessment/Plan  1. Essential hypertension Need more recent BPs which have not been taken or recorded in matrix. Will address with staff.   2. Paroxysmal atrial fibrillation (HCC) Rate controlled with toprol Continue Eliquis for CVA risk reduction   3. Late onset Alzheimer's disease with behavioral disturbance ([place]) Progressive decline in memory/cognition Doing well in the memory care setting.   4. Current use of long term anticoagulation Continue current Eliquis for CVA risk reduction Periodically monitor CBC  5. Osteopenia of multiple sites No longer going out for Dexa scans.  Continue Ca and Vit D supplementation  6. Hyponatremia Check BMP, currently on sodium tabs  7. Gait abnormality Walks with a walker due to gait issue related to dementia  8. Slow transit constipation Continue miralax 17 grams daily, also takes Office manager Communication: staff  Labs/tests ordered: CBC BMP

## 2019-01-03 DIAGNOSIS — I10 Essential (primary) hypertension: Secondary | ICD-10-CM | POA: Diagnosis not present

## 2019-01-03 DIAGNOSIS — D649 Anemia, unspecified: Secondary | ICD-10-CM | POA: Diagnosis not present

## 2019-01-03 LAB — BASIC METABOLIC PANEL
BUN: 17 (ref 4–21)
CREATININE: 1 (ref 0.5–1.1)
Glucose: 98
POTASSIUM: 4.8 (ref 3.4–5.3)
Sodium: 137 (ref 137–147)

## 2019-01-03 LAB — CBC AND DIFFERENTIAL
HCT: 39 (ref 36–46)
Hemoglobin: 13.2 (ref 12.0–16.0)
PLATELETS: 229 (ref 150–399)
WBC: 5.6

## 2019-01-09 DIAGNOSIS — H16123 Filamentary keratitis, bilateral: Secondary | ICD-10-CM | POA: Diagnosis not present

## 2019-01-15 DIAGNOSIS — H16123 Filamentary keratitis, bilateral: Secondary | ICD-10-CM | POA: Diagnosis not present

## 2019-01-22 DIAGNOSIS — H16123 Filamentary keratitis, bilateral: Secondary | ICD-10-CM | POA: Diagnosis not present

## 2019-01-30 DIAGNOSIS — H16123 Filamentary keratitis, bilateral: Secondary | ICD-10-CM | POA: Diagnosis not present

## 2019-02-04 DIAGNOSIS — H16123 Filamentary keratitis, bilateral: Secondary | ICD-10-CM | POA: Diagnosis not present

## 2019-02-12 ENCOUNTER — Telehealth: Payer: Self-pay | Admitting: *Deleted

## 2019-02-12 NOTE — Telephone Encounter (Signed)
Ardyth Gal, husband called and left message on clinical intake and stated that his letter Stating patient had Progressive Dementia dated 03/30/2018 was messed up in the copier and he needs a copy of it.  Printed  I called Husband and he stated that he found his letter and no longer needs a copy.

## 2019-02-18 ENCOUNTER — Non-Acute Institutional Stay: Payer: Medicare Other | Admitting: Adult Health

## 2019-02-18 ENCOUNTER — Encounter: Payer: Self-pay | Admitting: Adult Health

## 2019-02-18 DIAGNOSIS — M25562 Pain in left knee: Secondary | ICD-10-CM

## 2019-02-18 DIAGNOSIS — W19XXXA Unspecified fall, initial encounter: Secondary | ICD-10-CM | POA: Diagnosis not present

## 2019-02-18 DIAGNOSIS — F02818 Dementia in other diseases classified elsewhere, unspecified severity, with other behavioral disturbance: Secondary | ICD-10-CM

## 2019-02-18 DIAGNOSIS — R5383 Other fatigue: Secondary | ICD-10-CM | POA: Diagnosis not present

## 2019-02-18 DIAGNOSIS — G301 Alzheimer's disease with late onset: Secondary | ICD-10-CM

## 2019-02-18 DIAGNOSIS — F0281 Dementia in other diseases classified elsewhere with behavioral disturbance: Secondary | ICD-10-CM | POA: Diagnosis not present

## 2019-02-18 DIAGNOSIS — D649 Anemia, unspecified: Secondary | ICD-10-CM | POA: Diagnosis not present

## 2019-02-18 DIAGNOSIS — M25552 Pain in left hip: Secondary | ICD-10-CM | POA: Diagnosis not present

## 2019-02-18 LAB — BASIC METABOLIC PANEL
BUN: 20 (ref 4–21)
Creatinine: 1.1 (ref 0.5–1.1)
Glucose: 120
Potassium: 3.9 (ref 3.4–5.3)
Sodium: 141 (ref 137–147)

## 2019-02-18 LAB — CBC AND DIFFERENTIAL
HCT: 35 — AB (ref 36–46)
Hemoglobin: 12.1 (ref 12.0–16.0)
Platelets: 210 (ref 150–399)
WBC: 6.2

## 2019-02-18 NOTE — Progress Notes (Signed)
Location:  Occupational psychologist of Service:  ALF (13) Provider:   [name], ANP [place] [phone]   [name], DO  Patient Care Team: [name], DO as PCP - General (Geriatric Medicine) [name], MD as Consulting Physician (Cardiology) Community, Well [name], MD as Consulting Physician (Gastroenterology) [name], [name], MD as Consulting Physician (Dermatology) [name], MD as Consulting Physician (Cardiology) [name], MD as Consulting Physician (Ophthalmology) Zimmer, [name], NP as Nurse Practitioner (Internal Medicine) [name], MD as Consulting Physician (Gynecology)  Extended Emergency Contact Information Primary Emergency Contact: [facility] Address: [address]          [place], [place] [address] [name] of [place] Phone: [phone] Work Phone: [phone] Mobile Phone: [phone] Relation: Spouse  Code Status:  DNR Goals of care: Advanced Directive information Advanced Directives 08/28/2018  Does Patient Have a Medical Advance Directive? Yes  Type of Paramedic of [place];Living will;Out of facility DNR (pink MOST or yellow form)  Does patient want to make changes to medical advance directive? No - Patient declined  Copy of [place] in Chart? Yes  Would patient like information on creating a medical advance directive? -  Pre-existing out of facility DNR order (yellow form or pink MOST form) Yellow form placed in chart (order not valid for inpatient use)     Chief Complaint  Patient presents with   Acute Visit    falls, sleeping more    HPI:  Pt is a 85 y.o. female seen today for an acute visit for falls and falling asleep more. She has a hx of AD and resides in the memory care setting. She has had two falls in the past week on 3/20 and 3/29.  The first fall  she was found on the floor sitting on her bottom. After she complained of buttock pain but this resolved. ON 3/29 she fell in between her entry door and the bathroom door. Later she complained of left knee pain. She is walking less and has trouble bearing weight on the left leg due to pain but with her dementia this has been difficult to assess. Her caregiver reports that she is sleeping more and is has less eye contact and conversing. There is more difficulty with dressing and bathing due to decreased ROM to her arms and legs. She (and her nurse) denies any dysuria, frequency, fever, sob, cp, doe, etc.     Past Medical History:  Diagnosis Date   Actinic keratoses    Allergy    Alzheimer disease ([place]) 05/21/2015   Anxiety    Arrhythmia    atrial fib   Atrial fibrillation ([place]) 12/2012   Basal cell carcinoma of skin    Cataracts, both eyes 2005, 2013   Cataract extraction/IOL implant   Current use of long term anticoagulation 12/2012   Eliquis   Depression    Diverticulosis 2008   Fracture of scaphoid bone of hand 08/05/2004   History of UTI    Hyperlipidemia    Hypertension    Hyponatremia    SIADH   Low sodium levels    Mild cognitive impairment    Osteopenia    Rosacea    Squamous cell carcinoma    Thyroid nodule    Past Surgical History:  Procedure Laterality Date   ABDOMINAL HYSTERECTOMY  1970   TAH-BSO   cataracts Bilateral 2/05 and 3/05  CESAREAN SECTION     questionable    Allergies  Allergen Reactions   Solifenacin Other (See Comments)   Ciprofloxacin Other (See Comments)    Reaction unknown   Latex Other (See Comments)    Reaction unknown   Macrobid [Nitrofurantoin Macrocrystal] Nausea And Vomiting   Macrodantin [Nitrofurantoin] Nausea Only    Upset stomach   Other Other (See Comments)    Popcorn- Diverticulosis   Strawberry Extract Other (See Comments)    Diverticulosis    Tramadol Nausea Only    Outpatient Encounter  Medications as of 02/18/2019  Medication Sig   apixaban (ELIQUIS) 2.5 MG TABS tablet Take 1 tablet (2.5 mg total) by mouth 2 (two) times daily.   Calcium Carbonate-Vitamin D (CALCIUM 600+D PO) Take by mouth daily.   carboxymethylcellulose (REFRESH PLUS) 0.5 % SOLN 1 drop every hour as needed.   cholecalciferol (VITAMIN D) 1000 UNITS tablet Take 2,000 Units by mouth daily.   magnesium hydroxide (MILK OF MAGNESIA) 400 MG/5ML suspension Take 20 mLs by mouth daily as needed for mild constipation.   metoprolol succinate (TOPROL-XL) 25 MG 24 hr tablet Take 12.5 mg by mouth daily.   mirtazapine (REMERON) 15 MG tablet Take 15 mg at bedtime by mouth.   Multiple Vitamin (MULTIVITAMIN) tablet Take 1 tablet by mouth daily.   polyethylene glycol (MIRALAX / GLYCOLAX) packet Take 17 g by mouth daily. 1/2 dose daily   saccharomyces boulardii (FLORASTOR) 250 MG capsule Take 250 mg by mouth daily.   sodium chloride 1 G tablet Take 1 g by mouth 2 (two) times daily with a meal.   trimethoprim (TRIMPEX) 100 MG tablet Take 100 mg by mouth daily.   acetaminophen (TYLENOL) 325 MG tablet Take 650 mg by mouth every 6 (six) hours as needed.    [DISCONTINUED] NON FORMULARY Place 1 drop into both eyes every 4 (four) hours while awake. N-acetylcysteine drops   [DISCONTINUED] NON FORMULARY Place 1 drop into both eyes every 4 (four) hours while awake. Administers 1 hr after n acetylcysteine   [DISCONTINUED] NON FORMULARY Place 1 drop into both eyes daily. Avelox 0.5%   No facility-administered encounter medications on file as of 02/18/2019.     Review of Systems  Unable to perform ROS: Dementia    Immunization History  Administered Date(s) Administered   H1N1 12/04/2008   Influenza,inj,Quad PF,6+ Mos 09/11/2018   Influenza-Unspecified 09/15/2003, 11/01/2010, 09/12/2011, 07/22/2016, 09/11/2017   Pneumococcal Conjugate-13 04/14/2017   Pneumococcal Polysaccharide-23 12/19/2008   Td 09/15/2003,  01/06/2016   Zoster Recombinat (Shingrix) 08/21/2017, 11/21/2017   Pertinent  Health Maintenance Due  Topic Date Due   INFLUENZA VACCINE  Completed   DEXA SCAN  Completed   PNA vac Low Risk Adult  Completed   Fall Risk  08/15/2018 07/12/2017 06/21/2017 05/04/2017 03/08/2017  Falls in the past year? No No No No No   Functional Status Survey:    Vitals:   02/18/19 1603  BP: (!) 143/70  Pulse: 61  Resp: 16  Temp: (!) 97.1 F (36.2 C)  SpO2: 96%   There is no height or weight on file to calculate BMI. Physical Exam Constitutional:      General: She is not in acute distress.    Appearance: She is normal weight. She is not toxic-appearing.  HENT:     Head: Normocephalic and atraumatic.     Nose: Nose normal. No congestion.     Mouth/Throat:     Mouth: Mucous membranes are moist.  Pharynx: Oropharynx is clear. No oropharyngeal exudate or posterior oropharyngeal erythema.  Eyes:     General:        Right eye: No discharge.        Left eye: No discharge.     Conjunctiva/sclera: Conjunctivae normal.     Pupils: Pupils are equal, round, and reactive to light.  Neck:     Musculoskeletal: No neck rigidity.  Cardiovascular:     Rate and Rhythm: Normal rate. Rhythm irregular.     Heart sounds: No murmur.  Pulmonary:     Effort: Pulmonary effort is normal.     Breath sounds: Normal breath sounds.  Abdominal:     General: Abdomen is flat. Bowel sounds are normal. There is no distension.     Palpations: Abdomen is soft.     Tenderness: There is no right CVA tenderness or left CVA tenderness.  Musculoskeletal:        General: No swelling, tenderness, deformity or signs of injury.     Right lower leg: No edema.     Left lower leg: No edema.  Lymphadenopathy:     Cervical: No cervical adenopathy.  Skin:    General: Skin is warm and dry.     Comments: Bruise to dorsum of right hand and skin tear to the right knee  Neurological:     General: No focal deficit present.      Motor: No weakness.     Comments: Sleepy but arouses to verbal stim.   Psychiatric:        Mood and Affect: Mood normal.     Labs reviewed: Recent Labs    07/02/18 0600 01/03/19  NA 136* 137  K 4.6 4.8  BUN 18 17  CREATININE 0.9 1.0   No results for input(s): AST, ALT, ALKPHOS, BILITOT, PROT, ALBUMIN in the last 8760 hours. Recent Labs    01/03/19  WBC 5.6  HGB 13.2  HCT 39  PLT 229   Lab Results  Component Value Date   TSH 2.29 10/18/2018   No results found for: HGBA1C No results found for: CHOL, HDL, LDLCALC, LDLDIRECT, TRIG, CHOLHDL  Significant Diagnostic Results in last 30 days:  No results found.  Assessment/Plan 1. Late onset Alzheimer's disease with behavioral disturbance ([place]) Has shown signs of progression with worsening delusions and forgetfulness. This attribute to her falls and lethargy. She is also showing signs of slight rigidity to BUE and BLE, consider parkinsonism. Continue to monitor and provide supportive care in the memory setting.   2. Fall, initial encounter Likely due to progressive dementia Staff to implement fall precautions Walker use with ambulation, she needs frequent reminders.  Door removed from the bathroom which was seen as a cause for her falls. Unfortunately as discussed with her husband, she is not a candidate for PT as she can not follow instructions.    3. Acute pain of left knee No sign of injury on exam but since she has had a fall and has difficulty bearing weight will xray. Schedule tylenol 650 mg bid x 1 weeks  4. Lethargy Will check labs to rule out other causes. No sign of infection or other cause at this time. See #1    Family/ staff Communication: discussed with her husband Tripp  Labs/tests ordered:  CBC BMP Xray of left knee and left hip/pelvis

## 2019-02-21 ENCOUNTER — Non-Acute Institutional Stay: Payer: Medicare Other | Admitting: Adult Health

## 2019-02-21 ENCOUNTER — Encounter: Payer: Self-pay | Admitting: Adult Health

## 2019-02-21 DIAGNOSIS — S81811A Laceration without foreign body, right lower leg, initial encounter: Secondary | ICD-10-CM

## 2019-02-21 DIAGNOSIS — R296 Repeated falls: Secondary | ICD-10-CM

## 2019-02-21 NOTE — Progress Notes (Signed)
Location:  Occupational psychologist of Service:  ALF (13) Provider:   [name], ANP [place] [phone]  [name], DO  Patient Care Team: [name], DO as PCP - General (Geriatric Medicine) [name], MD as Consulting Physician (Cardiology) Community, Well [name], MD as Consulting Physician (Gastroenterology) [name], [name], MD as Consulting Physician (Dermatology) [name], MD as Consulting Physician (Cardiology) [name], MD as Consulting Physician (Ophthalmology) Zimmer, [name], NP as Nurse Practitioner (Internal Medicine) [name], MD as Consulting Physician (Gynecology)  Extended Emergency Contact Information Primary Emergency Contact: [facility] Address: [address]          [place], [place] [address] [name] of [place] Phone: [phone] Work Phone: [phone] Mobile Phone: [phone] Relation: Spouse  Code Status:  DNR Goals of care: Advanced Directive information Advanced Directives 08/28/2018  Does Patient Have a Medical Advance Directive? Yes  Type of Paramedic of [place];Living will;Out of facility DNR (pink MOST or yellow form)  Does patient want to make changes to medical advance directive? No - Patient declined  Copy of [place] in Chart? Yes  Would patient like information on creating a medical advance directive? -  Pre-existing out of facility DNR order (yellow form or pink MOST form) Yellow form placed in chart (order not valid for inpatient use)     Chief Complaint  Patient presents with  . Acute Visit    RLE skin tear    HPI:  Pt is a 85 y.o. female seen today for an acute visit for RLE skin tear. She has had several falls in the past week. CBC and BMP were ordered with no acute findings. She has AD and seems to be progressing with weakness, keeping her  eyes close, decreased ROM, and gait issues. No acute fever, dysuria, SOB, cp, etc. The skin tear was found this morning to the RLE and is quite large.  The nurse thinks she fell last night using her rollator, unclear about the details. The resident denies pain or discomfort.    Past Medical History:  Diagnosis Date  . Actinic keratoses   . Allergy   . Alzheimer disease ([place]) 05/21/2015  . Anxiety   . Arrhythmia    atrial fib  . Atrial fibrillation ([place]) 12/2012  . Basal cell carcinoma of skin   . Cataracts, both eyes 2005, 2013   Cataract extraction/IOL implant  . Current use of long term anticoagulation 12/2012   Eliquis  . Depression   . Diverticulosis 2008  . Fracture of scaphoid bone of hand 08/05/2004  . History of UTI   . Hyperlipidemia   . Hypertension   . Hyponatremia    SIADH  . Low sodium levels   . Mild cognitive impairment   . Osteopenia   . Rosacea   . Squamous cell carcinoma   . Thyroid nodule    Past Surgical History:  Procedure Laterality Date  . ABDOMINAL HYSTERECTOMY  1970   TAH-BSO  . cataracts Bilateral 2/05 and 3/05  . CESAREAN SECTION     questionable    Allergies  Allergen Reactions  . Solifenacin Other (See Comments)  . Ciprofloxacin Other (See Comments)    Reaction unknown  . Latex Other (See Comments)    Reaction unknown  . Macrobid [Nitrofurantoin Macrocrystal] Nausea And Vomiting  . Macrodantin [Nitrofurantoin] Nausea Only    Upset stomach  . Other  Other (See Comments)    Popcorn- Diverticulosis  . Strawberry Extract Other (See Comments)    Diverticulosis   . Tramadol Nausea Only    Outpatient Encounter Medications as of 02/21/2019  Medication Sig  . acetaminophen (TYLENOL) 325 MG tablet Take 650 mg by mouth 2 (two) times daily.   Marland Kitchen apixaban (ELIQUIS) 2.5 MG TABS tablet Take 1 tablet (2.5 mg total) by mouth 2 (two) times daily.  . Calcium Carbonate-Vitamin D (CALCIUM 600+D PO) Take by mouth daily.  . carboxymethylcellulose  (REFRESH PLUS) 0.5 % SOLN 1 drop every hour as needed.  . cholecalciferol (VITAMIN D) 1000 UNITS tablet Take 2,000 Units by mouth daily.  . magnesium hydroxide (MILK OF MAGNESIA) 400 MG/5ML suspension Take 20 mLs by mouth daily as needed for mild constipation.  . metoprolol succinate (TOPROL-XL) 25 MG 24 hr tablet Take 12.5 mg by mouth daily.  . mirtazapine (REMERON) 15 MG tablet Take 15 mg at bedtime by mouth.  . Multiple Vitamin (MULTIVITAMIN) tablet Take 1 tablet by mouth daily.  . polyethylene glycol (MIRALAX / GLYCOLAX) packet Take 17 g by mouth daily. 1/2 dose daily  . saccharomyces boulardii (FLORASTOR) 250 MG capsule Take 250 mg by mouth daily.  . sodium chloride 1 G tablet Take 1 g by mouth 2 (two) times daily with a meal.  . trimethoprim (TRIMPEX) 100 MG tablet Take 100 mg by mouth daily.   No facility-administered encounter medications on file as of 02/21/2019.     Review of Systems  Unable to perform ROS: Dementia    Immunization History  Administered Date(s) Administered  . H1N1 12/04/2008  . Influenza,inj,Quad PF,6+ Mos 09/11/2018  . Influenza-Unspecified 09/15/2003, 11/01/2010, 09/12/2011, 07/22/2016, 09/11/2017  . Pneumococcal Conjugate-13 04/14/2017  . Pneumococcal Polysaccharide-23 12/19/2008  . Td 09/15/2003, 01/06/2016  . Zoster Recombinat (Shingrix) 08/21/2017, 11/21/2017   Pertinent  Health Maintenance Due  Topic Date Due  . INFLUENZA VACCINE  06/22/2019  . DEXA SCAN  Completed  . PNA vac Low Risk Adult  Completed   Fall Risk  08/15/2018 07/12/2017 06/21/2017 05/04/2017 03/08/2017  Falls in the past year? No No No No No   Functional Status Survey:    Vitals:   02/21/19 1237  BP: 110/66  Pulse: 64  Resp: 18  Temp: (!) 97.4 F (36.3 C)  SpO2: 98%   There is no height or weight on file to calculate BMI. Physical Exam Vitals signs and nursing note reviewed.  Musculoskeletal:        General: No swelling, tenderness or deformity.     Right lower leg: No  edema.     Left lower leg: No edema.     Comments: Decreased ROM with rigidity to BUE and BLE  Skin:    General: Skin is warm and dry.     Comments: Posterior right calf with horizontal skin tear measuring 6 x 1.25 cm  Neurological:     General: No focal deficit present.     Comments: Eyes stay closed most of the time. She repeats herself and has difficulty following commands.   Psychiatric:        Mood and Affect: Mood normal.     Labs reviewed: Recent Labs    07/02/18 0600 01/03/19 02/18/19  NA 136* 137 141  K 4.6 4.8 3.9  BUN 18 17 20   CREATININE 0.9 1.0 1.1   No results for input(s): AST, ALT, ALKPHOS, BILITOT, PROT, ALBUMIN in the last 8760 hours. Recent Labs    01/03/19 02/18/19  WBC 5.6 6.2  HGB 13.2 12.1  HCT 39 35*  PLT 229 210   Lab Results  Component Value Date   TSH 2.29 10/18/2018   No results found for: HGBA1C No results found for: CHOL, HDL, LDLCALC, LDLDIRECT, TRIG, CHOLHDL  Significant Diagnostic Results in last 30 days:  No results found.  Assessment/Plan  1. Noninfected skin tear of right lower extremity, initial encounter Approximated by staff with steri strips well  No signs of acute infection, continue non adherent dressing changes and temp monitoring.   2. Frequent falls Due to progressive dementia/rollator use Bed alarm PT to eval and treat for new walker as the rollator is no longer appropriate.    Family/ staff Communication: staff Labs/tests ordered:  NA

## 2019-02-26 ENCOUNTER — Non-Acute Institutional Stay: Payer: Medicare Other | Admitting: Internal Medicine

## 2019-02-26 ENCOUNTER — Encounter: Payer: Self-pay | Admitting: Internal Medicine

## 2019-02-26 DIAGNOSIS — R296 Repeated falls: Secondary | ICD-10-CM

## 2019-02-26 DIAGNOSIS — S81811A Laceration without foreign body, right lower leg, initial encounter: Secondary | ICD-10-CM | POA: Diagnosis not present

## 2019-02-26 DIAGNOSIS — R483 Visual agnosia: Secondary | ICD-10-CM | POA: Diagnosis not present

## 2019-02-26 DIAGNOSIS — G301 Alzheimer's disease with late onset: Secondary | ICD-10-CM | POA: Diagnosis not present

## 2019-02-26 DIAGNOSIS — F0281 Dementia in other diseases classified elsewhere with behavioral disturbance: Secondary | ICD-10-CM

## 2019-02-26 DIAGNOSIS — F02818 Dementia in other diseases classified elsewhere, unspecified severity, with other behavioral disturbance: Secondary | ICD-10-CM

## 2019-02-26 NOTE — Progress Notes (Signed)
Patient ID: Cathy Dickerson, female   DOB: Sep 12, 1927, 85 y.o.   MRN: [id number]  Location:  [facility] Room Number: 629 memory care Place of Service:  ALF [phone]) Provider:   [name], DO  Patient Care Team: [name], DO as PCP - General (Geriatric Medicine) [name], MD as Consulting Physician (Cardiology) Community, Well [name], MD as Consulting Physician (Gastroenterology) [name], [name], MD as Consulting Physician (Dermatology) [name], MD as Consulting Physician (Cardiology) [name], MD as Consulting Physician (Ophthalmology) Zimmer, [name], NP as Nurse Practitioner (Internal Medicine) [name], MD as Consulting Physician (Gynecology)  Extended Emergency Contact Information Primary Emergency Contact: Thibodaux,[name] Address: [address]          [place], [place] [address] [name] of [place] Phone: [phone] Work Phone: [phone] Mobile Phone: [phone] Relation: Spouse  Code Status:  DNR Goals of care: Advanced Directive information Advanced Directives 08/28/2018  Does Patient Have a Medical Advance Directive? Yes  Type of Paramedic of [place];Living will;Out of facility DNR (pink MOST or yellow form)  Does patient want to make changes to medical advance directive? No - Patient declined  Copy of [place] in Chart? Yes  Would patient like information on creating a medical advance directive? -  Pre-existing out of facility DNR order (yellow form or pink MOST form) Yellow form placed in chart (order not valid for inpatient use)     Chief Complaint  Patient presents with  . Acute Visit    leg laceration    HPI:  Pt is a 85 y.o. female seen today for an acute visit for leg lacerations of both legs.    3/20, she'd fallen and had skin tears of her right knee.  She was treated with  tylenol and some xrays of her left hip and pelvis and left knee.  They were negative .   The one skin tear could not be fully approximated as the skin had already dried in position--shaped like a shark tooth.    4/2, Shonia sustained another skin tear--initially described as follows:  "skin tear on the back of resident's right lower leg. Skin tear is approximately 6 inches long and edges were approximated with 15 steri strips. Covered with non adherent telfa and wrapped with kerlix. Skin tear assessed by NP and she wrote new orders for PT and a bed alarm. Copy of order taken to therapy department. Bed alarm set up in room. I called and spoke with resident's husband. Assessed resident's room and all equipment for sharp edges. I made sure that resident's Tdap was up to date and her last was in February 2017."  Resident continues to c/o pain at this site during dressing changes and if any pressure is applied to the area.  She has not been walking very much at all, spending more time in bed.    Her husband reports she is also struggling more with her vision thought this is felt to be due to her dementia rather than an ophthalmologic issue.  Staff also note she is bothered by her back and requested something be done about it.  Staff had asked me to see her about the large laceration on the back of her right leg--"do you think it needs prophylactic antibiotics?"    Past Medical History:  Diagnosis Date  . Actinic keratoses   . Allergy   . Alzheimer disease ([place])  05/21/2015  . Anxiety   . Arrhythmia    atrial fib  . Atrial fibrillation ([place]) 12/2012  . Basal cell carcinoma of skin   . Cataracts, both eyes 2005, 2013   Cataract extraction/IOL implant  . Current use of long term anticoagulation 12/2012   Eliquis  . Depression   . Diverticulosis 2008  . Fracture of scaphoid bone of hand 08/05/2004  . History of UTI   . Hyperlipidemia   . Hypertension   . Hyponatremia    SIADH  . Low sodium levels    . Mild cognitive impairment   . Osteopenia   . Rosacea   . Squamous cell carcinoma   . Thyroid nodule    Past Surgical History:  Procedure Laterality Date  . ABDOMINAL HYSTERECTOMY  1970   TAH-BSO  . cataracts Bilateral 2/05 and 3/05  . CESAREAN SECTION     questionable    Allergies  Allergen Reactions  . Solifenacin Other (See Comments)  . Ciprofloxacin Other (See Comments)    Reaction unknown  . Latex Other (See Comments)    Reaction unknown  . Macrobid [Nitrofurantoin Macrocrystal] Nausea And Vomiting  . Macrodantin [Nitrofurantoin] Nausea Only    Upset stomach  . Other Other (See Comments)    Popcorn- Diverticulosis  . Strawberry Extract Other (See Comments)    Diverticulosis   . Tramadol Nausea Only    Outpatient Encounter Medications as of 02/26/2019  Medication Sig  . acetaminophen (TYLENOL) 325 MG tablet Take 650 mg by mouth 2 (two) times daily.   Marland Kitchen apixaban (ELIQUIS) 2.5 MG TABS tablet Take 1 tablet (2.5 mg total) by mouth 2 (two) times daily.  . Calcium Carbonate-Vitamin D (CALCIUM 600+D PO) Take by mouth daily.  . carboxymethylcellulose (REFRESH PLUS) 0.5 % SOLN 1 drop every hour as needed.  . cholecalciferol (VITAMIN D) 1000 UNITS tablet Take 2,000 Units by mouth daily.  . magnesium hydroxide (MILK OF MAGNESIA) 400 MG/5ML suspension Take 20 mLs by mouth daily as needed for mild constipation.  . metoprolol succinate (TOPROL-XL) 25 MG 24 hr tablet Take 12.5 mg by mouth daily.  . mirtazapine (REMERON) 15 MG tablet Take 15 mg at bedtime by mouth.  . Multiple Vitamin (MULTIVITAMIN) tablet Take 1 tablet by mouth daily.  . polyethylene glycol (MIRALAX / GLYCOLAX) packet Take 17 g by mouth daily. 1/2 dose daily  . saccharomyces boulardii (FLORASTOR) 250 MG capsule Take 250 mg by mouth daily.  . sodium chloride 1 G tablet Take 1 g by mouth 2 (two) times daily with a meal.  . trimethoprim (TRIMPEX) 100 MG tablet Take 100 mg by mouth daily.   No facility-administered  encounter medications on file as of 02/26/2019.     Review of Systems  Constitutional: Positive for activity change and fatigue. Negative for appetite change, chills and fever.  Eyes: Positive for visual disturbance.  Respiratory: Negative for shortness of breath.   Cardiovascular: Negative for chest pain, palpitations and leg swelling.  Gastrointestinal: Negative for constipation.  Genitourinary: Negative for dysuria.  Musculoskeletal: Positive for gait problem.  Skin: Positive for pallor.       Large skin tear posterior right lower leg and small shark-tooth shaped skin tear on left lower leg; neither one has any surrounding erythema, increased warmth, drainage or foul odor; right leg is tender to move or touch  Neurological: Negative for dizziness and weakness.  Psychiatric/Behavioral: Positive for confusion. Negative for sleep disturbance.    Immunization History  Administered Date(s) Administered  .  H1N1 12/04/2008  . Influenza,inj,Quad PF,6+ Mos 09/11/2018  . Influenza-Unspecified 09/15/2003, 11/01/2010, 09/12/2011, 07/22/2016, 09/11/2017  . Pneumococcal Conjugate-13 04/14/2017  . Pneumococcal Polysaccharide-23 12/19/2008  . Td 09/15/2003, 01/06/2016  . Zoster Recombinat (Shingrix) 08/21/2017, 11/21/2017   Pertinent  Health Maintenance Due  Topic Date Due  . INFLUENZA VACCINE  06/22/2019  . DEXA SCAN  Completed  . PNA vac Low Risk Adult  Completed   Fall Risk  08/15/2018 07/12/2017 06/21/2017 05/04/2017 03/08/2017  Falls in the past year? No No No No No    Vitals:   02/26/19 1034  BP: 96/73  Pulse: 64  Resp: (!) 22  Temp: 97.8 F (36.6 C)  TempSrc: Oral  SpO2: 97%  Weight: 116 lb (52.6 kg)  Height: 5\' 3"  (1.6 m)   Body mass index is 20.55 kg/m. Physical Exam Vitals signs and nursing note reviewed.  Constitutional:      General: She is not in acute distress.    Comments: Resting in bed  HENT:     Head: Normocephalic and atraumatic.  Eyes:     Pupils: Pupils are  equal, round, and reactive to light.  Cardiovascular:     Rate and Rhythm: Rhythm irregularly irregular.  Pulmonary:     Effort: Pulmonary effort is normal.     Breath sounds: Normal breath sounds.  Abdominal:     General: Bowel sounds are normal.  Skin:    Comments: Large skin tear posterior right lower leg and small shark-tooth shaped skin tear on left lower leg; neither one has any surrounding erythema, increased warmth, drainage or foul odor; right leg is tender to move or touch   Neurological:     General: No focal deficit present.     Mental Status: She is alert.     Labs reviewed: Recent Labs    07/02/18 0600 01/03/19 02/18/19  NA 136* 137 141  K 4.6 4.8 3.9  BUN 18 17 20   CREATININE 0.9 1.0 1.1   No results for input(s): AST, ALT, ALKPHOS, BILITOT, PROT, ALBUMIN in the last 8760 hours. Recent Labs    01/03/19 02/18/19  WBC 5.6 6.2  HGB 13.2 12.1  HCT 39 35*  PLT 229 210   Lab Results  Component Value Date   TSH 2.29 10/18/2018   Assessment/Plan 1. Laceration of skin of right lower leg without complication, initial encounter -no signs of infection, prophylactic abx not recommended, monitor for erythema, warmth, drainage, fever -cont tylenol for pain for a few more weeks while this heals -cont same dressings at this point  2. Frequent falls -more lately as her dementia progresses -cont caregiver support, decrease potential sharp items that could harm her if she falls, staff arranged to remove bathroom door to help   3. Late onset Alzheimer's disease with behavioral disturbance ([place]) -FAST6, continue supportive care  4. Visual agnosia -getting worse per Trip, continue memory care support  Family/ staff Communication: discussed with memory care nurse and pt's husband, Trip  Labs/tests ordered:  No new  Mackinzie Vuncannon L. Johnnathan Hagemeister, D.O. [facility] [address], [place] [address] Cell Phone (Mon-Fri 8am-5pm):   [phone] On Call:  [phone] & follow prompts after 5pm & weekends Office Phone:  [phone] Office Fax:  [phone]

## 2019-02-27 DIAGNOSIS — R2689 Other abnormalities of gait and mobility: Secondary | ICD-10-CM | POA: Diagnosis not present

## 2019-02-27 DIAGNOSIS — F0281 Dementia in other diseases classified elsewhere with behavioral disturbance: Secondary | ICD-10-CM | POA: Diagnosis not present

## 2019-02-27 DIAGNOSIS — Z9181 History of falling: Secondary | ICD-10-CM | POA: Diagnosis not present

## 2019-02-27 DIAGNOSIS — R278 Other lack of coordination: Secondary | ICD-10-CM | POA: Diagnosis not present

## 2019-02-27 DIAGNOSIS — G301 Alzheimer's disease with late onset: Secondary | ICD-10-CM | POA: Diagnosis not present

## 2019-02-28 DIAGNOSIS — F0281 Dementia in other diseases classified elsewhere with behavioral disturbance: Secondary | ICD-10-CM | POA: Diagnosis not present

## 2019-02-28 DIAGNOSIS — R2689 Other abnormalities of gait and mobility: Secondary | ICD-10-CM | POA: Diagnosis not present

## 2019-02-28 DIAGNOSIS — Z9181 History of falling: Secondary | ICD-10-CM | POA: Diagnosis not present

## 2019-02-28 DIAGNOSIS — G301 Alzheimer's disease with late onset: Secondary | ICD-10-CM | POA: Diagnosis not present

## 2019-02-28 DIAGNOSIS — R278 Other lack of coordination: Secondary | ICD-10-CM | POA: Diagnosis not present

## 2019-03-01 DIAGNOSIS — F0281 Dementia in other diseases classified elsewhere with behavioral disturbance: Secondary | ICD-10-CM | POA: Diagnosis not present

## 2019-03-01 DIAGNOSIS — Z9181 History of falling: Secondary | ICD-10-CM | POA: Diagnosis not present

## 2019-03-01 DIAGNOSIS — G301 Alzheimer's disease with late onset: Secondary | ICD-10-CM | POA: Diagnosis not present

## 2019-03-01 DIAGNOSIS — R278 Other lack of coordination: Secondary | ICD-10-CM | POA: Diagnosis not present

## 2019-03-01 DIAGNOSIS — R2689 Other abnormalities of gait and mobility: Secondary | ICD-10-CM | POA: Diagnosis not present

## 2019-03-03 NOTE — Addendum Note (Signed)
Addended by: [name] on: 03/03/2019 03:28 PM   Modules accepted: Level of Service

## 2019-03-04 DIAGNOSIS — F0281 Dementia in other diseases classified elsewhere with behavioral disturbance: Secondary | ICD-10-CM | POA: Diagnosis not present

## 2019-03-04 DIAGNOSIS — R278 Other lack of coordination: Secondary | ICD-10-CM | POA: Diagnosis not present

## 2019-03-04 DIAGNOSIS — Z9181 History of falling: Secondary | ICD-10-CM | POA: Diagnosis not present

## 2019-03-04 DIAGNOSIS — G301 Alzheimer's disease with late onset: Secondary | ICD-10-CM | POA: Diagnosis not present

## 2019-03-04 DIAGNOSIS — R2689 Other abnormalities of gait and mobility: Secondary | ICD-10-CM | POA: Diagnosis not present

## 2019-03-05 DIAGNOSIS — Z9181 History of falling: Secondary | ICD-10-CM | POA: Diagnosis not present

## 2019-03-05 DIAGNOSIS — R278 Other lack of coordination: Secondary | ICD-10-CM | POA: Diagnosis not present

## 2019-03-05 DIAGNOSIS — R2689 Other abnormalities of gait and mobility: Secondary | ICD-10-CM | POA: Diagnosis not present

## 2019-03-05 DIAGNOSIS — G301 Alzheimer's disease with late onset: Secondary | ICD-10-CM | POA: Diagnosis not present

## 2019-03-05 DIAGNOSIS — F0281 Dementia in other diseases classified elsewhere with behavioral disturbance: Secondary | ICD-10-CM | POA: Diagnosis not present

## 2019-03-06 DIAGNOSIS — G301 Alzheimer's disease with late onset: Secondary | ICD-10-CM | POA: Diagnosis not present

## 2019-03-06 DIAGNOSIS — R2689 Other abnormalities of gait and mobility: Secondary | ICD-10-CM | POA: Diagnosis not present

## 2019-03-06 DIAGNOSIS — R278 Other lack of coordination: Secondary | ICD-10-CM | POA: Diagnosis not present

## 2019-03-06 DIAGNOSIS — Z9181 History of falling: Secondary | ICD-10-CM | POA: Diagnosis not present

## 2019-03-06 DIAGNOSIS — F0281 Dementia in other diseases classified elsewhere with behavioral disturbance: Secondary | ICD-10-CM | POA: Diagnosis not present

## 2019-03-07 DIAGNOSIS — Z9181 History of falling: Secondary | ICD-10-CM | POA: Diagnosis not present

## 2019-03-07 DIAGNOSIS — F0281 Dementia in other diseases classified elsewhere with behavioral disturbance: Secondary | ICD-10-CM | POA: Diagnosis not present

## 2019-03-07 DIAGNOSIS — G301 Alzheimer's disease with late onset: Secondary | ICD-10-CM | POA: Diagnosis not present

## 2019-03-07 DIAGNOSIS — R2689 Other abnormalities of gait and mobility: Secondary | ICD-10-CM | POA: Diagnosis not present

## 2019-03-07 DIAGNOSIS — R278 Other lack of coordination: Secondary | ICD-10-CM | POA: Diagnosis not present

## 2019-03-08 DIAGNOSIS — R278 Other lack of coordination: Secondary | ICD-10-CM | POA: Diagnosis not present

## 2019-03-08 DIAGNOSIS — F0281 Dementia in other diseases classified elsewhere with behavioral disturbance: Secondary | ICD-10-CM | POA: Diagnosis not present

## 2019-03-08 DIAGNOSIS — R2689 Other abnormalities of gait and mobility: Secondary | ICD-10-CM | POA: Diagnosis not present

## 2019-03-08 DIAGNOSIS — Z9181 History of falling: Secondary | ICD-10-CM | POA: Diagnosis not present

## 2019-03-08 DIAGNOSIS — G301 Alzheimer's disease with late onset: Secondary | ICD-10-CM | POA: Diagnosis not present

## 2019-03-11 DIAGNOSIS — R278 Other lack of coordination: Secondary | ICD-10-CM | POA: Diagnosis not present

## 2019-03-11 DIAGNOSIS — G301 Alzheimer's disease with late onset: Secondary | ICD-10-CM | POA: Diagnosis not present

## 2019-03-11 DIAGNOSIS — R2689 Other abnormalities of gait and mobility: Secondary | ICD-10-CM | POA: Diagnosis not present

## 2019-03-11 DIAGNOSIS — F0281 Dementia in other diseases classified elsewhere with behavioral disturbance: Secondary | ICD-10-CM | POA: Diagnosis not present

## 2019-03-11 DIAGNOSIS — Z9181 History of falling: Secondary | ICD-10-CM | POA: Diagnosis not present

## 2019-03-12 ENCOUNTER — Non-Acute Institutional Stay: Payer: Medicare Other | Admitting: Internal Medicine

## 2019-03-12 ENCOUNTER — Encounter: Payer: Self-pay | Admitting: Internal Medicine

## 2019-03-12 DIAGNOSIS — S81811D Laceration without foreign body, right lower leg, subsequent encounter: Secondary | ICD-10-CM

## 2019-03-12 DIAGNOSIS — I4891 Unspecified atrial fibrillation: Secondary | ICD-10-CM | POA: Diagnosis not present

## 2019-03-12 DIAGNOSIS — Z7901 Long term (current) use of anticoagulants: Secondary | ICD-10-CM | POA: Diagnosis not present

## 2019-03-12 DIAGNOSIS — G301 Alzheimer's disease with late onset: Secondary | ICD-10-CM | POA: Diagnosis not present

## 2019-03-12 DIAGNOSIS — R5383 Other fatigue: Secondary | ICD-10-CM

## 2019-03-12 DIAGNOSIS — R296 Repeated falls: Secondary | ICD-10-CM | POA: Diagnosis not present

## 2019-03-12 DIAGNOSIS — F0281 Dementia in other diseases classified elsewhere with behavioral disturbance: Secondary | ICD-10-CM | POA: Diagnosis not present

## 2019-03-12 DIAGNOSIS — F02818 Dementia in other diseases classified elsewhere, unspecified severity, with other behavioral disturbance: Secondary | ICD-10-CM

## 2019-03-12 NOTE — Progress Notes (Signed)
Patient ID: Cathy Dickerson, female   DOB: 07/17/1927, 85 y.o.   MRN: [id number]  Location:  [facility] Room Number: 244 memory care Place of Service:  ALF [phone]) Provider:   [name], DO  Patient Care Team: [name], DO as PCP - General (Geriatric Medicine) [name], MD as Consulting Physician (Cardiology) Community, Well [name], MD as Consulting Physician (Gastroenterology) [name], [name], MD as Consulting Physician (Dermatology) [name], MD as Consulting Physician (Cardiology) [name], MD as Consulting Physician (Ophthalmology) Zimmer, [name], NP as Nurse Practitioner (Internal Medicine) [name], MD as Consulting Physician (Gynecology)  Extended Emergency Contact Information Primary Emergency Contact: Honaker,[name] Address: [address]          [place], [place] [address] [name] of [place] Phone: [phone] Work Phone: [phone] Mobile Phone: [phone] Relation: Spouse  Code Status:  DNR Goals of care: Advanced Directive information Advanced Directives 08/28/2018  Does Patient Have a Medical Advance Directive? Yes  Type of Paramedic of [place];Living will;Out of facility DNR (pink MOST or yellow form)  Does patient want to make changes to medical advance directive? No - Patient declined  Copy of [place] in Chart? Yes  Would patient like information on creating a medical advance directive? -  Pre-existing out of facility DNR order (yellow form or pink MOST form) Yellow form placed in chart (order not valid for inpatient use)     Chief Complaint  Patient presents with  . Acute Visit    rapid heartrate, sleeping alot    HPI:  Pt is a 85 y.o. female with Alzheimer's disease, falls, two leg wounds healing, paroxysmal afib, osteopenia, htn, incontinence, constipation seen today for  an acute visit for ongoing difficulty with lethargy x about 2.5 weeks, poor po intake (just a couple of bites now at breakfast), decreased urine output, and newly noted rapid irregular heart beat this morning.  She just wants to lie down.  She has been reporting nausea and not wanting to eat.    I went to see her and she was actually eating her lunch with assistance (being fed), but was still asking to go rest in bed.  She denied dizziness, palpitations, chest pain, nausea, abdominal pain, or shortness of breath.  She had refused to have her BP checked but her other vitals at the time of the visit were HR 130, irregular, temp 97.2, sats 95% RA, RR 14.  She weighed 116.8 on 4/2 and prior to this event bps had been running 120s-160s/50s to 70s and HR 56-82 but mostly in the 60s.  She has been pale.  As mentioned, intake has been poor.  She remains on low dose metoprolol succinate 12.5mg  po bid and eliquis anticoagulation.  Staff report that she bruised terribly after her last blood draw which upset her husband.  Past Medical History:  Diagnosis Date  . Actinic keratoses   . Allergy   . Alzheimer disease ([place]) 05/21/2015  . Anxiety   . Arrhythmia    atrial fib  . Atrial fibrillation ([place]) 12/2012  . Basal cell carcinoma of skin   . Cataracts, both eyes 2005, 2013   Cataract extraction/IOL implant  . Current use of long term anticoagulation 12/2012   Eliquis  . Depression   . Diverticulosis 2008  . Fracture of scaphoid bone of hand 08/05/2004  . History of UTI   . Hyperlipidemia   .  Hypertension   . Hyponatremia    SIADH  . Low sodium levels   . Mild cognitive impairment   . Osteopenia   . Rosacea   . Squamous cell carcinoma   . Thyroid nodule    Past Surgical History:  Procedure Laterality Date  . ABDOMINAL HYSTERECTOMY  1970   TAH-BSO  . cataracts Bilateral 2/05 and 3/05  . CESAREAN SECTION     questionable    Allergies  Allergen Reactions  . Solifenacin Other (See Comments)    . Ciprofloxacin Other (See Comments)    Reaction unknown  . Latex Other (See Comments)    Reaction unknown  . Macrobid [Nitrofurantoin Macrocrystal] Nausea And Vomiting  . Macrodantin [Nitrofurantoin] Nausea Only    Upset stomach  . Other Other (See Comments)    Popcorn- Diverticulosis  . Strawberry Extract Other (See Comments)    Diverticulosis   . Tramadol Nausea Only    Outpatient Encounter Medications as of 03/12/2019  Medication Sig  . acetaminophen (TYLENOL) 325 MG tablet Take 650 mg by mouth 2 (two) times daily.   Marland Kitchen apixaban (ELIQUIS) 2.5 MG TABS tablet Take 1 tablet (2.5 mg total) by mouth 2 (two) times daily.  . Calcium Carbonate-Vitamin D (CALCIUM 600+D PO) Take by mouth daily.  . carboxymethylcellulose (REFRESH PLUS) 0.5 % SOLN 1 drop every hour as needed.  . cholecalciferol (VITAMIN D) 1000 UNITS tablet Take 2,000 Units by mouth daily.  . magnesium hydroxide (MILK OF MAGNESIA) 400 MG/5ML suspension Take 20 mLs by mouth daily as needed for mild constipation.  . metoprolol succinate (TOPROL-XL) 25 MG 24 hr tablet Take 12.5 mg by mouth daily.  . mirtazapine (REMERON) 15 MG tablet Take 15 mg at bedtime by mouth.  . Multiple Vitamin (MULTIVITAMIN) tablet Take 1 tablet by mouth daily.  . polyethylene glycol (MIRALAX / GLYCOLAX) packet Take 17 g by mouth daily. 1/2 dose daily  . saccharomyces boulardii (FLORASTOR) 250 MG capsule Take 250 mg by mouth daily.  . sodium chloride 1 G tablet Take 1 g by mouth 2 (two) times daily with a meal.  . trimethoprim (TRIMPEX) 100 MG tablet Take 100 mg by mouth daily.   No facility-administered encounter medications on file as of 03/12/2019.     Review of Systems  Constitutional: Positive for activity change, appetite change and fatigue. Negative for chills, diaphoresis and fever.       Chronically cold  HENT: Positive for hearing loss. Negative for congestion.   Eyes: Positive for visual disturbance.  Respiratory: Negative for cough and  shortness of breath.   Cardiovascular: Negative for chest pain, palpitations and leg swelling.  Gastrointestinal: Positive for nausea. Negative for abdominal pain, blood in stool, constipation, diarrhea and vomiting.  Genitourinary:       Functional incontinence but less urine output lately  Musculoskeletal: Positive for gait problem.       Being transported in manual wheelchair, but she's wanting to get up and self-transfer  Skin: Positive for color change and pallor.       Lacerations slowly healing--steristrips intact, no warmth, erythema surrounding or new drainage, still some tenderness  Neurological: Positive for weakness. Negative for dizziness.  Hematological: Bruises/bleeds easily.  Psychiatric/Behavioral: Positive for confusion. Negative for sleep disturbance.    Immunization History  Administered Date(s) Administered  . H1N1 12/04/2008  . Influenza,inj,Quad PF,6+ Mos 09/11/2018  . Influenza-Unspecified 09/15/2003, 11/01/2010, 09/12/2011, 07/22/2016, 09/11/2017  . Pneumococcal Conjugate-13 04/14/2017  . Pneumococcal Polysaccharide-23 12/19/2008  . Td 09/15/2003, 01/06/2016  .  Zoster Recombinat (Shingrix) 08/21/2017, 11/21/2017   Pertinent  Health Maintenance Due  Topic Date Due  . INFLUENZA VACCINE  06/22/2019  . DEXA SCAN  Completed  . PNA vac Low Risk Adult  Completed   Fall Risk  08/15/2018 07/12/2017 06/21/2017 05/04/2017 03/08/2017  Falls in the past year? No No No No No   Functional Status Survey:    Vitals:   03/12/19 1149  BP: (!) 147/67  Pulse: 60  Resp: 19  Temp: (!) 97.2 F (36.2 C)  TempSrc: Oral  SpO2: 97%  Weight: 116 lb (52.6 kg)  Height: 5\' 3"  (1.6 m)   Body mass index is 20.55 kg/m. Physical Exam Vitals signs reviewed.  Constitutional:      General: She is not in acute distress.    Appearance: She is ill-appearing. She is not toxic-appearing.  HENT:     Head: Normocephalic and atraumatic.     Ears:     Comments: glasses    Mouth/Throat:       Mouth: Mucous membranes are dry.  Eyes:     Comments: No current erythema of eyes with frequent eye drops  Cardiovascular:     Rate and Rhythm: Tachycardia present. Rhythm irregular.  Pulmonary:     Effort: Pulmonary effort is normal.     Breath sounds: Normal breath sounds.  Abdominal:     General: Bowel sounds are normal.     Palpations: Abdomen is soft.  Musculoskeletal: Normal range of motion.  Skin:    General: Skin is warm and dry.     Coloration: Skin is pale.  Neurological:     Mental Status: She is alert.     Comments: Visual agnosia progressively worse  Psychiatric:     Comments: Getting agitated about not lying down immediately after lunch     Labs reviewed: Recent Labs    07/02/18 0600 01/03/19 02/18/19  NA 136* 137 141  K 4.6 4.8 3.9  BUN 18 17 20   CREATININE 0.9 1.0 1.1   No results for input(s): AST, ALT, ALKPHOS, BILITOT, PROT, ALBUMIN in the last 8760 hours. Recent Labs    01/03/19 02/18/19  WBC 5.6 6.2  HGB 13.2 12.1  HCT 39 35*  PLT 229 210   Lab Results  Component Value Date   TSH 2.29 10/18/2018   No results found for: HGBA1C No results found for: CHOL, HDL, LDLCALC, LDLDIRECT, TRIG, CHOLHDL  Significant Diagnostic Results in last 30 days:  EKG done today:  afib at HR 128bpm, slight ST depression in V2, V5 and V6  Assessment/Plan 1. Atrial fibrillation with rapid ventricular response (HCC) -suspect this has been the source of her recent lethargy and malaise -nursing reports that caregiver had noted rapid pulse off and on before this (not noted with CNA vitals though) -treated with an additional dose of her metoprolol 12.5mg  once -advised to recheck HR in 1 hr and q 4 hrs while aware between 9am and 7pm until HR normalizes -monitor for other symptoms -push fluids -opted not to draw labs at this time as pt did not even want her blood pressure checked and husband was upset about severe bruising last time she had labs -pt also said  during EKG--"I don't know why we're doing this at my age"  2. Lethargy -for about 2.5 weeks--started around when she injured her leg, but no signs of infection there--suspect some spurts of rapid afib may have caused her to fall more  3. Late onset Alzheimer's disease with behavioral  disturbance (HCC) -advancing, cont memory care support with feeding -push fluids of 8oz per shift at least due to decreased urine output after intake had been poor  4. Laceration of skin of right lower leg without complication, subsequent encounter -is gradually healing, was severe, cont current mgt  5. Frequent falls -suspect worsened by her afib and declining vision  6. Current use of long term anticoagulation -cont eliquis therapy  Family/ staff Communication: discussed with memory care nurse  Labs/tests ordered:  EKG as above  Selina Tapper L. Amil Moseman, D.O. [facility] [address], [place] [address] Cell Phone (Mon-Fri 8am-5pm):  [phone] On Call:  [phone] & follow prompts after 5pm & weekends Office Phone:  [phone] Office Fax:  [phone]

## 2019-03-13 DIAGNOSIS — Z9181 History of falling: Secondary | ICD-10-CM | POA: Diagnosis not present

## 2019-03-13 DIAGNOSIS — G301 Alzheimer's disease with late onset: Secondary | ICD-10-CM | POA: Diagnosis not present

## 2019-03-13 DIAGNOSIS — R278 Other lack of coordination: Secondary | ICD-10-CM | POA: Diagnosis not present

## 2019-03-13 DIAGNOSIS — F0281 Dementia in other diseases classified elsewhere with behavioral disturbance: Secondary | ICD-10-CM | POA: Diagnosis not present

## 2019-03-13 DIAGNOSIS — R2689 Other abnormalities of gait and mobility: Secondary | ICD-10-CM | POA: Diagnosis not present

## 2019-03-14 DIAGNOSIS — F0281 Dementia in other diseases classified elsewhere with behavioral disturbance: Secondary | ICD-10-CM | POA: Diagnosis not present

## 2019-03-14 DIAGNOSIS — R278 Other lack of coordination: Secondary | ICD-10-CM | POA: Diagnosis not present

## 2019-03-14 DIAGNOSIS — Z9181 History of falling: Secondary | ICD-10-CM | POA: Diagnosis not present

## 2019-03-14 DIAGNOSIS — R2689 Other abnormalities of gait and mobility: Secondary | ICD-10-CM | POA: Diagnosis not present

## 2019-03-14 DIAGNOSIS — G301 Alzheimer's disease with late onset: Secondary | ICD-10-CM | POA: Diagnosis not present

## 2019-03-15 DIAGNOSIS — R2689 Other abnormalities of gait and mobility: Secondary | ICD-10-CM | POA: Diagnosis not present

## 2019-03-15 DIAGNOSIS — R278 Other lack of coordination: Secondary | ICD-10-CM | POA: Diagnosis not present

## 2019-03-15 DIAGNOSIS — Z9181 History of falling: Secondary | ICD-10-CM | POA: Diagnosis not present

## 2019-03-15 DIAGNOSIS — F0281 Dementia in other diseases classified elsewhere with behavioral disturbance: Secondary | ICD-10-CM | POA: Diagnosis not present

## 2019-03-15 DIAGNOSIS — G301 Alzheimer's disease with late onset: Secondary | ICD-10-CM | POA: Diagnosis not present

## 2019-03-18 DIAGNOSIS — R2689 Other abnormalities of gait and mobility: Secondary | ICD-10-CM | POA: Diagnosis not present

## 2019-03-18 DIAGNOSIS — F0281 Dementia in other diseases classified elsewhere with behavioral disturbance: Secondary | ICD-10-CM | POA: Diagnosis not present

## 2019-03-18 DIAGNOSIS — Z9181 History of falling: Secondary | ICD-10-CM | POA: Diagnosis not present

## 2019-03-18 DIAGNOSIS — R278 Other lack of coordination: Secondary | ICD-10-CM | POA: Diagnosis not present

## 2019-03-18 DIAGNOSIS — G301 Alzheimer's disease with late onset: Secondary | ICD-10-CM | POA: Diagnosis not present

## 2019-03-20 DIAGNOSIS — Z9181 History of falling: Secondary | ICD-10-CM | POA: Diagnosis not present

## 2019-03-20 DIAGNOSIS — R2689 Other abnormalities of gait and mobility: Secondary | ICD-10-CM | POA: Diagnosis not present

## 2019-03-20 DIAGNOSIS — F0281 Dementia in other diseases classified elsewhere with behavioral disturbance: Secondary | ICD-10-CM | POA: Diagnosis not present

## 2019-03-20 DIAGNOSIS — R278 Other lack of coordination: Secondary | ICD-10-CM | POA: Diagnosis not present

## 2019-03-20 DIAGNOSIS — G301 Alzheimer's disease with late onset: Secondary | ICD-10-CM | POA: Diagnosis not present

## 2019-03-20 DIAGNOSIS — H16123 Filamentary keratitis, bilateral: Secondary | ICD-10-CM | POA: Diagnosis not present

## 2019-03-22 DIAGNOSIS — G301 Alzheimer's disease with late onset: Secondary | ICD-10-CM | POA: Diagnosis not present

## 2019-03-22 DIAGNOSIS — R278 Other lack of coordination: Secondary | ICD-10-CM | POA: Diagnosis not present

## 2019-03-22 DIAGNOSIS — Z9181 History of falling: Secondary | ICD-10-CM | POA: Diagnosis not present

## 2019-03-22 DIAGNOSIS — R2689 Other abnormalities of gait and mobility: Secondary | ICD-10-CM | POA: Diagnosis not present

## 2019-03-22 DIAGNOSIS — F0281 Dementia in other diseases classified elsewhere with behavioral disturbance: Secondary | ICD-10-CM | POA: Diagnosis not present

## 2019-03-25 DIAGNOSIS — R2689 Other abnormalities of gait and mobility: Secondary | ICD-10-CM | POA: Diagnosis not present

## 2019-03-25 DIAGNOSIS — F0281 Dementia in other diseases classified elsewhere with behavioral disturbance: Secondary | ICD-10-CM | POA: Diagnosis not present

## 2019-03-25 DIAGNOSIS — G301 Alzheimer's disease with late onset: Secondary | ICD-10-CM | POA: Diagnosis not present

## 2019-03-25 DIAGNOSIS — R278 Other lack of coordination: Secondary | ICD-10-CM | POA: Diagnosis not present

## 2019-03-25 DIAGNOSIS — Z9181 History of falling: Secondary | ICD-10-CM | POA: Diagnosis not present

## 2019-03-27 DIAGNOSIS — H16123 Filamentary keratitis, bilateral: Secondary | ICD-10-CM | POA: Diagnosis not present

## 2019-04-03 DIAGNOSIS — H16123 Filamentary keratitis, bilateral: Secondary | ICD-10-CM | POA: Diagnosis not present

## 2019-04-10 DIAGNOSIS — H16123 Filamentary keratitis, bilateral: Secondary | ICD-10-CM | POA: Diagnosis not present

## 2019-04-19 DIAGNOSIS — H16123 Filamentary keratitis, bilateral: Secondary | ICD-10-CM | POA: Diagnosis not present

## 2019-04-22 ENCOUNTER — Encounter: Payer: Self-pay | Admitting: Adult Health

## 2019-04-22 ENCOUNTER — Non-Acute Institutional Stay: Payer: Medicare Other | Admitting: Adult Health

## 2019-04-22 DIAGNOSIS — K5901 Slow transit constipation: Secondary | ICD-10-CM | POA: Diagnosis not present

## 2019-04-22 DIAGNOSIS — E871 Hypo-osmolality and hyponatremia: Secondary | ICD-10-CM

## 2019-04-22 DIAGNOSIS — G301 Alzheimer's disease with late onset: Secondary | ICD-10-CM

## 2019-04-22 DIAGNOSIS — Z7901 Long term (current) use of anticoagulants: Secondary | ICD-10-CM | POA: Diagnosis not present

## 2019-04-22 DIAGNOSIS — I480 Paroxysmal atrial fibrillation: Secondary | ICD-10-CM | POA: Diagnosis not present

## 2019-04-22 DIAGNOSIS — F0281 Dementia in other diseases classified elsewhere with behavioral disturbance: Secondary | ICD-10-CM | POA: Diagnosis not present

## 2019-04-22 DIAGNOSIS — Z87440 Personal history of urinary (tract) infections: Secondary | ICD-10-CM | POA: Diagnosis not present

## 2019-04-22 DIAGNOSIS — I10 Essential (primary) hypertension: Secondary | ICD-10-CM | POA: Diagnosis not present

## 2019-04-22 NOTE — Progress Notes (Signed)
Location:  Occupational psychologist of Service:  ALF (13) Provider:   [name], ANP [place] [phone]   [name], DO  Patient Care Team: [name], DO as PCP - General (Geriatric Medicine) [name], MD as Consulting Physician (Cardiology) Community, Well [name], MD as Consulting Physician (Gastroenterology) [name], [name], MD as Consulting Physician (Dermatology) [name], MD as Consulting Physician (Cardiology) [name], MD as Consulting Physician (Ophthalmology) Zimmer, [name], NP as Nurse Practitioner (Internal Medicine) [name], MD as Consulting Physician (Gynecology)  Extended Emergency Contact Information Primary Emergency Contact: [facility] Address: [address]          [place],  [address] [name] of [place] Phone: [phone] Work Phone: [phone] Mobile Phone: [phone] Relation: Spouse  Code Status:  DNR Goals of care: Advanced Directive information Advanced Directives 08/28/2018  Does Patient Have a Medical Advance Directive? Yes  Type of Paramedic of [place];Living will;Out of facility DNR (pink MOST or yellow form)  Does patient want to make changes to medical advance directive? No - Patient declined  Copy of [place] in Chart? Yes  Would patient like information on creating a medical advance directive? -  Pre-existing out of facility DNR order (yellow form or pink MOST form) Yellow form placed in chart (order not valid for inpatient use)     Chief Complaint  Patient presents with  . Medical Management of Chronic Issues    HPI:  Pt is a 85 y.o. female seen today for medical management of chronic diseases.  She resides in the memory care unit due to AD. She has experienced some progression of disease and spends more time in the [facility] now but can walk  short distances in her room. She has no issues with behaviors but can have periods of delusions and needs re orientation. Vitals have been stable. In April she had an episode of tachycardia and an EKG was ordered which showed rapid afib. Metoprolol was given and the situation resolved. No other issues have been reported at this time.    Past Medical History:  Diagnosis Date  . Actinic keratoses   . Allergy   . Alzheimer disease ([place]) 05/21/2015  . Anxiety   . Arrhythmia    atrial fib  . Atrial fibrillation ([place]) 12/2012  . Basal cell carcinoma of skin   . Cataracts, both eyes 2005, 2013   Cataract extraction/IOL implant  . Current use of long term anticoagulation 12/2012   Eliquis  . Depression   . Diverticulosis 2008  . Fracture of scaphoid bone of hand 08/05/2004  . History of UTI   . Hyperlipidemia   . Hypertension   . Hyponatremia    SIADH  . Low sodium levels   . Mild cognitive impairment   . Osteopenia   . Rosacea   . Squamous cell carcinoma   . Thyroid nodule    Past Surgical History:  Procedure Laterality Date  . ABDOMINAL HYSTERECTOMY  1970   TAH-BSO  . cataracts Bilateral 2/05 and 3/05  . CESAREAN SECTION     questionable    Allergies  Allergen Reactions  . Solifenacin Other (See Comments)  . Ciprofloxacin Other (See Comments)    Reaction unknown  . Latex Other (See Comments)    Reaction unknown  . Macrobid [Nitrofurantoin Macrocrystal] Nausea And Vomiting  . Macrodantin [Nitrofurantoin] Nausea Only    Upset  stomach  . Other Other (See Comments)    Popcorn- Diverticulosis  . Strawberry Extract Other (See Comments)    Diverticulosis   . Tramadol Nausea Only    Outpatient Encounter Medications as of 04/22/2019  Medication Sig  . erythromycin ophthalmic ointment Place 1 application into the right eye 4 (four) times daily.  Marland Kitchen lactose free nutrition (BOOST) LIQD Take 237 mLs by mouth daily.  Marland Kitchen ofloxacin (OCUFLOX) 0.3 % ophthalmic solution Place 1 drop  into both eyes 4 (four) times daily.  Marland Kitchen acetaminophen (TYLENOL) 325 MG tablet Take 650 mg by mouth 2 (two) times daily.   Marland Kitchen apixaban (ELIQUIS) 2.5 MG TABS tablet Take 1 tablet (2.5 mg total) by mouth 2 (two) times daily.  . Calcium Carbonate-Vitamin D (CALCIUM 600+D PO) Take by mouth daily.  . carboxymethylcellulose (REFRESH PLUS) 0.5 % SOLN 1 drop every hour as needed.  . cholecalciferol (VITAMIN D) 1000 UNITS tablet Take 2,000 Units by mouth daily.  . magnesium hydroxide (MILK OF MAGNESIA) 400 MG/5ML suspension Take 20 mLs by mouth daily as needed for mild constipation.  . metoprolol succinate (TOPROL-XL) 25 MG 24 hr tablet Take 12.5 mg by mouth daily.  . mirtazapine (REMERON) 15 MG tablet Take 15 mg at bedtime by mouth.  . Multiple Vitamin (MULTIVITAMIN) tablet Take 1 tablet by mouth daily.  . polyethylene glycol (MIRALAX / GLYCOLAX) packet Take 17 g by mouth daily. 1/2 dose daily  . saccharomyces boulardii (FLORASTOR) 250 MG capsule Take 250 mg by mouth daily.  . sodium chloride 1 G tablet Take 1 g by mouth 2 (two) times daily with a meal.  . trimethoprim (TRIMPEX) 100 MG tablet Take 100 mg by mouth daily.   No facility-administered encounter medications on file as of 04/22/2019.     Review of Systems  Unable to perform ROS: Dementia    Immunization History  Administered Date(s) Administered  . H1N1 12/04/2008  . Influenza,inj,Quad PF,6+ Mos 09/11/2018  . Influenza-Unspecified 09/15/2003, 11/01/2010, 09/12/2011, 07/22/2016, 09/11/2017  . Pneumococcal Conjugate-13 04/14/2017  . Pneumococcal Polysaccharide-23 12/19/2008  . Td 09/15/2003, 01/06/2016  . Zoster Recombinat (Shingrix) 08/21/2017, 11/21/2017   Pertinent  Health Maintenance Due  Topic Date Due  . INFLUENZA VACCINE  06/22/2019  . DEXA SCAN  Completed  . PNA vac Low Risk Adult  Completed   Fall Risk  08/15/2018 07/12/2017 06/21/2017 05/04/2017 03/08/2017  Falls in the past year? No No No No No   Functional Status Survey:     There were no vitals filed for this visit. There is no height or weight on file to calculate BMI. Physical Exam Vitals signs and nursing note reviewed.  Constitutional:      General: She is not in acute distress.    Appearance: She is not diaphoretic.  HENT:     Head: Normocephalic and atraumatic.     Nose: No congestion or rhinorrhea.     Mouth/Throat:     Mouth: Mucous membranes are moist.     Pharynx: Oropharynx is clear. No oropharyngeal exudate.  Neck:     Vascular: No JVD.  Cardiovascular:     Rate and Rhythm: Normal rate. Rhythm irregular.     Heart sounds: No murmur.  Pulmonary:     Effort: Pulmonary effort is normal. No respiratory distress.     Breath sounds: Normal breath sounds. No wheezing.  Abdominal:     General: Abdomen is flat. Bowel sounds are normal.     Palpations: Abdomen is soft.  Musculoskeletal:  Right lower leg: No edema.     Left lower leg: No edema.  Skin:    General: Skin is warm and dry.  Neurological:     General: No focal deficit present.     Mental Status: She is alert. Mental status is at baseline.  Psychiatric:        Mood and Affect: Mood normal.     Labs reviewed: Recent Labs    07/02/18 0600 01/03/19 02/18/19  NA 136* 137 141  K 4.6 4.8 3.9  BUN 18 17 20   CREATININE 0.9 1.0 1.1   No results for input(s): AST, ALT, ALKPHOS, BILITOT, PROT, ALBUMIN in the last 8760 hours. Recent Labs    01/03/19 02/18/19  WBC 5.6 6.2  HGB 13.2 12.1  HCT 39 35*  PLT 229 210   Lab Results  Component Value Date   TSH 2.29 10/18/2018   No results found for: HGBA1C No results found for: CHOL, HDL, LDLCALC, LDLDIRECT, TRIG, CHOLHDL  Significant Diagnostic Results in last 30 days:  No results found.  Assessment/Plan 1. Paroxysmal atrial fibrillation (HCC) Rate controlled. Continue metoprolol succinate 12.5 mg qd   2. Essential hypertension Controlled  3. Late onset Alzheimer's disease with behavioral disturbance ([place]) She is no  longer able to complete MMSE testing Progressive decline in cognition and physical function c/w the disease. Continue supportive care in the skilled environment.  4. Slow transit constipation Continue miralax 17 grams qd  5. Current use of long term anticoagulation Due to afib for CVA risk reductions. Continue Eliquis 2.5 mg bid  6. History of recurrent UTIs Continue trimethoprim 100 mg qd  7. Hyponatremia NA 141 on 3/20 Continue sodium chloride 1 gram bid  Monitor BMP periodically   Family/ staff Communication: staff  Labs/tests ordered:  NA

## 2019-05-04 DIAGNOSIS — Z20828 Contact with and (suspected) exposure to other viral communicable diseases: Secondary | ICD-10-CM | POA: Diagnosis not present

## 2019-05-06 LAB — NOVEL CORONAVIRUS, NAA: SARS-CoV-2, NAA: NEGATIVE

## 2019-05-08 ENCOUNTER — Encounter: Payer: Self-pay | Admitting: Internal Medicine

## 2019-05-14 DIAGNOSIS — H16123 Filamentary keratitis, bilateral: Secondary | ICD-10-CM | POA: Diagnosis not present

## 2019-06-29 ENCOUNTER — Encounter: Payer: Self-pay | Admitting: Internal Medicine

## 2019-07-04 ENCOUNTER — Non-Acute Institutional Stay: Payer: Medicare Other | Admitting: Adult Health

## 2019-07-04 ENCOUNTER — Encounter: Payer: Self-pay | Admitting: Adult Health

## 2019-07-04 DIAGNOSIS — E871 Hypo-osmolality and hyponatremia: Secondary | ICD-10-CM

## 2019-07-04 DIAGNOSIS — F0281 Dementia in other diseases classified elsewhere with behavioral disturbance: Secondary | ICD-10-CM | POA: Diagnosis not present

## 2019-07-04 DIAGNOSIS — K5901 Slow transit constipation: Secondary | ICD-10-CM | POA: Diagnosis not present

## 2019-07-04 DIAGNOSIS — G301 Alzheimer's disease with late onset: Secondary | ICD-10-CM

## 2019-07-04 DIAGNOSIS — I480 Paroxysmal atrial fibrillation: Secondary | ICD-10-CM

## 2019-07-04 DIAGNOSIS — I10 Essential (primary) hypertension: Secondary | ICD-10-CM | POA: Diagnosis not present

## 2019-07-04 DIAGNOSIS — R269 Unspecified abnormalities of gait and mobility: Secondary | ICD-10-CM

## 2019-07-04 NOTE — Progress Notes (Signed)
Location:  Occupational psychologist of Service:  ALF (13) Provider:   [name], ANP [place] [phone]   [name], DO  Patient Care Team: [name], DO as PCP - General (Geriatric Medicine) [name], MD as Consulting Physician (Cardiology) Community, Well [name], MD as Consulting Physician (Gastroenterology) [name], [name], MD as Consulting Physician (Dermatology) [name], MD as Consulting Physician (Cardiology) [name], MD as Consulting Physician (Ophthalmology) Zimmer, [name], NP as Nurse Practitioner (Internal Medicine) [name], MD as Consulting Physician (Gynecology)  Extended Emergency Contact Information Primary Emergency Contact: [facility] Address: [address]          [place], [place] [address] Cathy Dickerson of [place] Phone: [phone] Work Phone: [phone] Mobile Phone: [phone] Relation: Spouse  Code Status:  DNR Goals of care: Advanced Directive information Advanced Directives 08/28/2018  Does Patient Have a Medical Advance Directive? Yes  Type of Paramedic of [place];Living will;Out of facility DNR (pink MOST or yellow form)  Does patient want to make changes to medical advance directive? No - Patient declined  Copy of [place] in Chart? Yes  Would patient like information on creating a medical advance directive? -  Pre-existing out of facility DNR order (yellow form or pink MOST form) Yellow form placed in chart (order not valid for inpatient use)     Chief Complaint  Patient presents with  . Medical Management of Chronic Issues    HPI:  Pt is a 85 y.o. female seen today for medical management of chronic diseases.    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 She is receiving ocuflox and refresh, and has contacts in her eyes. There are no  reports of pain, discomfort, or drainage.   She is walking less and using the WC more due to gait instability and weakness. She likes to sleep mid morning and does not like to be bothered.  She needs frequent reorientation and cuing. Assistance for all ADLs. No issues with appetite or bowels.   No cp, sob, increased weight, edema, or palpitations.     Past Medical History:  Diagnosis Date  . Actinic keratoses   . Allergy   . Alzheimer disease ([place]) 05/21/2015  . Anxiety   . Arrhythmia    atrial fib  . Atrial fibrillation ([place]) 12/2012  . Basal cell carcinoma of skin   . Cataracts, both eyes 2005, 2013   Cataract extraction/IOL implant  . Current use of long term anticoagulation 12/2012   Eliquis  . Depression   . Diverticulosis 2008  . Fracture of scaphoid bone of hand 08/05/2004  . History of UTI   . Hyperlipidemia   . Hypertension   . Hyponatremia    SIADH  . Low sodium levels   . Mild cognitive impairment   . Osteopenia   . Rosacea   . Squamous cell carcinoma   . Thyroid nodule    Past Surgical History:  Procedure Laterality Date  . ABDOMINAL HYSTERECTOMY  1970   TAH-BSO  . cataracts Bilateral 2/05 and 3/05  . CESAREAN SECTION     questionable    Allergies  Allergen Reactions  . Solifenacin Other (See Comments)  . Ciprofloxacin Other (See Comments)    Reaction unknown  . Latex Other (See Comments)    Reaction unknown  . Macrobid [Nitrofurantoin Macrocrystal] Nausea And Vomiting  . Macrodantin [Nitrofurantoin] Nausea Only  Upset stomach  . Other Other (See Comments)    Popcorn- Diverticulosis  . Strawberry Extract Other (See Comments)    Diverticulosis   . Tramadol Nausea Only    Outpatient Encounter Medications as of 07/04/2019  Medication Sig  . acetaminophen (TYLENOL) 325 MG tablet Take 650 mg by mouth every 6 (six) hours as needed.   Marland Kitchen apixaban (ELIQUIS) 2.5 MG TABS tablet Take 1 tablet (2.5 mg total) by mouth 2 (two) times daily.  . Calcium  Carbonate-Vitamin D (CALCIUM 600+D PO) Take by mouth daily.  . carboxymethylcellulose (REFRESH PLUS) 0.5 % SOLN 1 drop every hour as needed.  . cholecalciferol (VITAMIN D) 1000 UNITS tablet Take 2,000 Units by mouth daily.  Marland Kitchen lactose free nutrition (BOOST) LIQD Take 237 mLs by mouth daily.  . magnesium hydroxide (MILK OF MAGNESIA) 400 MG/5ML suspension Take 20 mLs by mouth daily as needed for mild constipation.  . metoprolol succinate (TOPROL-XL) 25 MG 24 hr tablet Take 12.5 mg by mouth daily.  . mirtazapine (REMERON) 15 MG tablet Take 15 mg at bedtime by mouth.  . Multiple Vitamin (MULTIVITAMIN) tablet Take 1 tablet by mouth daily.  Marland Kitchen ofloxacin (OCUFLOX) 0.3 % ophthalmic solution Place 1 drop into both eyes 4 (four) times daily.  . polyethylene glycol (MIRALAX / GLYCOLAX) packet Take 17 g by mouth daily. 1/2 dose daily  . saccharomyces boulardii (FLORASTOR) 250 MG capsule Take 250 mg by mouth daily.  . sodium chloride 1 G tablet Take 1 g by mouth 2 (two) times daily with a meal.  . trimethoprim (TRIMPEX) 100 MG tablet Take 100 mg by mouth daily.  . [DISCONTINUED] erythromycin ophthalmic ointment Place 1 application into the right eye 4 (four) times daily.   No facility-administered encounter medications on file as of 07/04/2019.     Review of Systems  Unable to perform ROS: Dementia    Immunization History  Administered Date(s) Administered  . H1N1 12/04/2008  . Influenza,inj,Quad PF,6+ Mos 09/11/2018  . Influenza-Unspecified 09/15/2003, 11/01/2010, 09/12/2011, 07/22/2016, 09/11/2017  . Pneumococcal Conjugate-13 04/14/2017  . Pneumococcal Polysaccharide-23 12/19/2008  . Td 09/15/2003, 01/06/2016  . Zoster Recombinat (Shingrix) 08/21/2017, 11/21/2017   Pertinent  Health Maintenance Due  Topic Date Due  . INFLUENZA VACCINE  06/22/2019  . DEXA SCAN  Completed  . PNA vac Low Risk Adult  Completed   Fall Risk  08/15/2018 07/12/2017 06/21/2017 05/04/2017 03/08/2017  Falls in the past year?  No No No No No   Functional Status Survey:    Vitals:   07/04/19 1613  Temp: 97.6 F (36.4 C)  Weight: 117 lb (53.1 kg)   Body mass index is 20.73 kg/m.  Wt Readings from Last 3 Encounters:  07/04/19 117 lb (53.1 kg)  03/12/19 116 lb (52.6 kg)  02/26/19 116 lb (52.6 kg)    Physical Exam Vitals signs and nursing note reviewed.  Constitutional:      General: She is not in acute distress.    Appearance: She is not diaphoretic.     Comments: Fetal position in bed, attempts to resist my exam despite explanation   HENT:     Head: Normocephalic and atraumatic.  Neck:     Musculoskeletal: No neck rigidity or muscular tenderness.     Vascular: No JVD.  Cardiovascular:     Rate and Rhythm: Normal rate. Rhythm irregular.     Heart sounds: No murmur.  Pulmonary:     Effort: Pulmonary effort is normal. No respiratory distress.  Breath sounds: Normal breath sounds. No wheezing.  Abdominal:     General: Bowel sounds are normal. There is no distension.     Palpations: Abdomen is soft.  Musculoskeletal:     Right lower leg: No edema.     Left lower leg: No edema.  Lymphadenopathy:     Cervical: No cervical adenopathy.  Skin:    General: Skin is warm and dry.  Neurological:     General: No focal deficit present.     Mental Status: She is alert. Mental status is at baseline.  Psychiatric:     Comments: resistant     Labs reviewed: Recent Labs    01/03/19 02/18/19  NA 137 141  K 4.8 3.9  BUN 17 20  CREATININE 1.0 1.1   No results for input(s): AST, ALT, ALKPHOS, BILITOT, PROT, ALBUMIN in the last 8760 hours. Recent Labs    01/03/19 02/18/19  WBC 5.6 6.2  HGB 13.2 12.1  HCT 39 35*  PLT 229 210   Lab Results  Component Value Date   TSH 2.29 10/18/2018   No results found for: HGBA1C No results found for: CHOL, HDL, LDLCALC, LDLDIRECT, TRIG, CHOLHDL  Significant Diagnostic Results in last 30 days:  No results found.  Assessment/Plan 1. Late onset  Alzheimer's disease with behavioral disturbance ([place]) Progressive decline in cognition and physical function c/w the disease. Continue supportive care in the skilled environment.  2. Essential hypertension Continue Toprol Xl 25 mg qd   3. Paroxysmal atrial fibrillation (HCC) Rate controlled Continue Eliquis for CVA risk reduction   4. Slow transit constipation Continue miralax 17 grams 1/2 dose daily   5. Hyponatremia Continue sodium 1 gram bi  6. Gait abnormality She is walking less and less and using the WC more due to progressive dementia and weakness. Needs walker for ambulation at all times and supervision to prevent falls. Continue WC use for longer distances    Family/ staff Communication: discussed with resident and staff   Labs/tests ordered: NA

## 2019-08-05 ENCOUNTER — Non-Acute Institutional Stay: Payer: Medicare Other | Admitting: Adult Health

## 2019-08-05 ENCOUNTER — Encounter: Payer: Self-pay | Admitting: Adult Health

## 2019-08-05 DIAGNOSIS — D692 Other nonthrombocytopenic purpura: Secondary | ICD-10-CM

## 2019-08-05 NOTE — Progress Notes (Signed)
Location:  Occupational psychologist of Service:  ALF (13) Provider:   [name], ANP [place] [phone]   [name], DO  Patient Care Team: [name], DO as PCP - General (Geriatric Medicine) [name], MD as Consulting Physician (Cardiology) Community, Well [name], MD as Consulting Physician (Gastroenterology) [name], [name], MD as Consulting Physician (Dermatology) [name], MD as Consulting Physician (Cardiology) [name], MD as Consulting Physician (Ophthalmology) Zimmer, [name], NP as Nurse Practitioner (Internal Medicine) [name], MD as Consulting Physician (Gynecology)  Extended Emergency Contact Information Primary Emergency Contact: [facility] Address: [address]          [place], [place] [address] [name] of [place] Phone: [phone] Work Phone: [phone] Mobile Phone: [phone] Relation: Spouse  Code Status:  DNR Goals of care: Advanced Directive information Advanced Directives 08/28/2018  Does Patient Have a Medical Advance Directive? Yes  Type of Paramedic of [place];Living will;Out of facility DNR (pink MOST or yellow form)  Does patient want to make changes to medical advance directive? No - Patient declined  Copy of [place] in Chart? Yes  Would patient like information on creating a medical advance directive? -  Pre-existing out of facility DNR order (yellow form or pink MOST form) Yellow form placed in chart (order not valid for inpatient use)     Chief Complaint  Patient presents with  . Acute Visit    excessive bruising    HPI:  Pt is a 85 y.o. female seen today for an acute visit for excessive bruising.  The nurse on the memory care unit reports that Cathy Dickerson has bruising to her arms and legs and her husband is wondering if we should continue the  Eliquis. She is on eliquis for CVA risk reduction.  There are no reports of difficulty in transfers or other injury to attribute the bruising. Other wise Cathy Dickerson is in her usual state of health.    Past Medical History:  Diagnosis Date  . Actinic keratoses   . Allergy   . Alzheimer disease ([place]) 05/21/2015  . Anxiety   . Arrhythmia    atrial fib  . Atrial fibrillation ([place]) 12/2012  . Basal cell carcinoma of skin   . Cataracts, both eyes 2005, 2013   Cataract extraction/IOL implant  . Current use of long term anticoagulation 12/2012   Eliquis  . Depression   . Diverticulosis 2008  . Fracture of scaphoid bone of hand 08/05/2004  . History of UTI   . Hyperlipidemia   . Hypertension   . Hyponatremia    SIADH  . Low sodium levels   . Mild cognitive impairment   . Osteopenia   . Rosacea   . Squamous cell carcinoma   . Thyroid nodule    Past Surgical History:  Procedure Laterality Date  . ABDOMINAL HYSTERECTOMY  1970   TAH-BSO  . cataracts Bilateral 2/05 and 3/05  . CESAREAN SECTION     questionable    Allergies  Allergen Reactions  . Solifenacin Other (See Comments)  . Ciprofloxacin Other (See Comments)    Reaction unknown  . Latex Other (See Comments)    Reaction unknown  . Macrobid [Nitrofurantoin Macrocrystal] Nausea And Vomiting  . Macrodantin [Nitrofurantoin] Nausea Only    Upset stomach  . Other Other (See Comments)    Popcorn- Diverticulosis  . Strawberry Extract Other (See Comments)  Diverticulosis   . Tramadol Nausea Only    Outpatient Encounter Medications as of 08/05/2019  Medication Sig  . acetaminophen (TYLENOL) 325 MG tablet Take 650 mg by mouth every 6 (six) hours as needed.   Marland Kitchen apixaban (ELIQUIS) 2.5 MG TABS tablet Take 1 tablet (2.5 mg total) by mouth 2 (two) times daily.  . Calcium Carbonate-Vitamin D (CALCIUM 600+D PO) Take by mouth daily.  . carboxymethylcellulose (REFRESH PLUS) 0.5 % SOLN 1 drop every hour as needed.  . cholecalciferol  (VITAMIN D) 1000 UNITS tablet Take 2,000 Units by mouth daily.  Marland Kitchen lactose free nutrition (BOOST) LIQD Take 237 mLs by mouth daily.  . magnesium hydroxide (MILK OF MAGNESIA) 400 MG/5ML suspension Take 20 mLs by mouth daily as needed for mild constipation.  . metoprolol succinate (TOPROL-XL) 25 MG 24 hr tablet Take 12.5 mg by mouth daily.  . mirtazapine (REMERON) 15 MG tablet Take 15 mg at bedtime by mouth.  . Multiple Vitamin (MULTIVITAMIN) tablet Take 1 tablet by mouth daily.  Marland Kitchen ofloxacin (OCUFLOX) 0.3 % ophthalmic solution Place 1 drop into both eyes 4 (four) times daily.  . polyethylene glycol (MIRALAX / GLYCOLAX) packet Take 17 g by mouth daily. 1/2 dose daily  . saccharomyces boulardii (FLORASTOR) 250 MG capsule Take 250 mg by mouth daily.  . sodium chloride 1 G tablet Take 1 g by mouth 2 (two) times daily with a meal.  . trimethoprim (TRIMPEX) 100 MG tablet Take 100 mg by mouth daily.   No facility-administered encounter medications on file as of 08/05/2019.     Review of Systems  Unable to perform ROS: Dementia    Immunization History  Administered Date(s) Administered  . H1N1 12/04/2008  . Influenza,inj,Quad PF,6+ Mos 09/11/2018  . Influenza-Unspecified 09/15/2003, 11/01/2010, 09/12/2011, 07/22/2016, 09/11/2017  . Pneumococcal Conjugate-13 04/14/2017  . Pneumococcal Polysaccharide-23 12/19/2008  . Td 09/15/2003, 01/06/2016  . Zoster Recombinat (Shingrix) 08/21/2017, 11/21/2017   Pertinent  Health Maintenance Due  Topic Date Due  . INFLUENZA VACCINE  06/22/2019  . DEXA SCAN  Completed  . PNA vac Low Risk Adult  Completed   Fall Risk  08/15/2018 07/12/2017 06/21/2017 05/04/2017 03/08/2017  Falls in the past year? No No No No No   Functional Status Survey:    Vitals:   08/05/19 1642  Temp: (!) 96.9 F (36.1 C)   There is no height or weight on file to calculate BMI. Physical Exam Constitutional:      Appearance: Normal appearance.  Skin:    General: Skin is warm and  dry.     Findings: Bruising (Noted to right forearm and bilateral lower extremities.) present.  Neurological:     Mental Status: She is alert.     Labs reviewed: Recent Labs    01/03/19 02/18/19  NA 137 141  K 4.8 3.9  BUN 17 20  CREATININE 1.0 1.1   No results for input(s): AST, ALT, ALKPHOS, BILITOT, PROT, ALBUMIN in the last 8760 hours. Recent Labs    01/03/19 02/18/19  WBC 5.6 6.2  HGB 13.2 12.1  HCT 39 35*  PLT 229 210   Lab Results  Component Value Date   TSH 2.29 10/18/2018   No results found for: HGBA1C No results found for: CHOL, HDL, LDLCALC, LDLDIRECT, TRIG, CHOLHDL  Significant Diagnostic Results in last 30 days:  No results found.  Assessment/Plan 1. Senile purpura (HCC) Noted to arms and legs. Some bruising would be expected given her age and eliquis usage.  I would  not describe it as severe. Will check CBC and eval platelets. We could consider discontinuing Eliquis given her advancing dementia and fall risk. Risk vs benefit would need to be considered.     Family/ staff Communication: staff to communicate with her husband  Labs/tests ordered:  CBC

## 2019-08-06 DIAGNOSIS — I10 Essential (primary) hypertension: Secondary | ICD-10-CM | POA: Diagnosis not present

## 2019-08-06 DIAGNOSIS — D649 Anemia, unspecified: Secondary | ICD-10-CM | POA: Diagnosis not present

## 2019-08-06 DIAGNOSIS — D72829 Elevated white blood cell count, unspecified: Secondary | ICD-10-CM | POA: Diagnosis not present

## 2019-08-06 LAB — CBC AND DIFFERENTIAL
HCT: 36 (ref 36–46)
Hemoglobin: 12.2 (ref 12.0–16.0)
Platelets: 209 (ref 150–399)
WBC: 4.8

## 2019-08-21 DIAGNOSIS — Z9189 Other specified personal risk factors, not elsewhere classified: Secondary | ICD-10-CM | POA: Diagnosis not present

## 2019-08-22 LAB — NOVEL CORONAVIRUS, NAA: SARS-CoV-2, NAA: NOT DETECTED

## 2019-08-29 DIAGNOSIS — Z20828 Contact with and (suspected) exposure to other viral communicable diseases: Secondary | ICD-10-CM | POA: Diagnosis not present

## 2019-09-03 ENCOUNTER — Non-Acute Institutional Stay: Payer: Medicare Other | Admitting: Internal Medicine

## 2019-09-03 ENCOUNTER — Encounter: Payer: Self-pay | Admitting: Internal Medicine

## 2019-09-03 DIAGNOSIS — I480 Paroxysmal atrial fibrillation: Secondary | ICD-10-CM | POA: Diagnosis not present

## 2019-09-03 DIAGNOSIS — G301 Alzheimer's disease with late onset: Secondary | ICD-10-CM | POA: Diagnosis not present

## 2019-09-03 DIAGNOSIS — F0281 Dementia in other diseases classified elsewhere with behavioral disturbance: Secondary | ICD-10-CM | POA: Diagnosis not present

## 2019-09-03 DIAGNOSIS — F02818 Dementia in other diseases classified elsewhere, unspecified severity, with other behavioral disturbance: Secondary | ICD-10-CM

## 2019-09-03 DIAGNOSIS — Z66 Do not resuscitate: Secondary | ICD-10-CM

## 2019-09-03 DIAGNOSIS — S81811D Laceration without foreign body, right lower leg, subsequent encounter: Secondary | ICD-10-CM | POA: Diagnosis not present

## 2019-09-03 DIAGNOSIS — R483 Visual agnosia: Secondary | ICD-10-CM | POA: Diagnosis not present

## 2019-09-03 DIAGNOSIS — N3941 Urge incontinence: Secondary | ICD-10-CM | POA: Diagnosis not present

## 2019-09-03 DIAGNOSIS — F321 Major depressive disorder, single episode, moderate: Secondary | ICD-10-CM | POA: Diagnosis not present

## 2019-09-03 NOTE — Progress Notes (Signed)
Patient ID: Cathy Dickerson, female   DOB: 1926/12/24, 85 y.o.   MRN: [id number]  Location:  [facility] Room Number: [id number], Memory Care Place of Service:  ALF [id number]) Provider:   [name], DO  Patient Care Team: [name], DO as PCP - General (Geriatric Medicine) [name], MD as Consulting Physician (Cardiology) Community, Well [name], MD as Consulting Physician (Gastroenterology) [name], [name], MD as Consulting Physician (Dermatology) [name], MD as Consulting Physician (Cardiology) [name], MD as Consulting Physician (Ophthalmology) Zimmer, [name], NP as Nurse Practitioner (Internal Medicine) [name], MD as Consulting Physician (Gynecology)  Extended Emergency Contact Information Primary Emergency Contact: Swallows,[name] Address: [address]          [place], [place] [address] [name] of [place] Phone: [phone] Work Phone: [phone] Mobile Phone: [phone] Relation: Spouse  Code Status:  DNR  Goals of care: Advanced Directive information Advanced Directives 09/03/2019  Does Patient Have a Medical Advance Directive? Yes  Type of Paramedic of [place];Living will;Out of facility DNR (pink MOST or yellow form)  Does patient want to make changes to medical advance directive? No - Patient declined  Copy of [place] in Chart? Yes - validated most recent copy scanned in chart (See row information)  Would patient like information on creating a medical advance directive? -  Pre-existing out of facility DNR order (yellow form or pink MOST form) Yellow form placed in chart (order not valid for inpatient use)     Chief Complaint  Patient presents with  . Medical Management of Chronic Issues    Routine Visit at [facility]   . Immunizations    Flu vaccine will be  given at facility this month 08/2019   . Advanced Directive    Reactivate DNR order     HPI:  Pt is a 85 y.o. female seen today for medical management of chronic diseases.    I contacted resident's husband via mychart to see if he wanted to be present via alexa virtually for our visit, but I did not hear a response.    RLE shin wound is now mostly scabbed over and to be allowed to be open to air after resident's whirlpool bath today.  She was out for her contact change on 08/28/19 at [facility].  She's been doing better overall since seeing better after issues with her vision were addressed by Dr. Ellie Lunch. She's been doing more activity and sleeping less.   Staff reported no new concerns. Past Medical History:  Diagnosis Date  . Actinic keratoses   . Allergy   . Alzheimer disease ([place]) 05/21/2015  . Anxiety   . Arrhythmia    atrial fib  . Atrial fibrillation ([place]) 12/2012  . Basal cell carcinoma of skin   . Cataracts, both eyes 2005, 2013   Cataract extraction/IOL implant  . Current use of long term anticoagulation 12/2012   Eliquis  . Depression   . Diverticulosis 2008  . Fracture of scaphoid bone of hand 08/05/2004  . History of UTI   . Hyperlipidemia   . Hypertension   . Hyponatremia    SIADH  . Low sodium levels   . Mild cognitive impairment   . Osteopenia   . Rosacea   . Squamous cell carcinoma   . Thyroid nodule    Past Surgical History:  Procedure Laterality Date  . ABDOMINAL  HYSTERECTOMY  1970   TAH-BSO  . cataracts Bilateral 2/05 and 3/05  . CESAREAN SECTION     questionable    Allergies  Allergen Reactions  . Solifenacin Other (See Comments)  . Ciprofloxacin Other (See Comments)    Reaction unknown  . Latex Other (See Comments)    Reaction unknown  . Macrobid [Nitrofurantoin Macrocrystal] Nausea And Vomiting  . Macrodantin [Nitrofurantoin] Nausea Only    Upset stomach  . Other Other (See Comments)    Popcorn- Diverticulosis  . Strawberry  Extract Other (See Comments)    Diverticulosis   . Tramadol Nausea Only    Outpatient Encounter Medications as of 09/03/2019  Medication Sig  . acetaminophen (TYLENOL) 325 MG tablet Take 650 mg by mouth every 6 (six) hours as needed.   Marland Kitchen apixaban (ELIQUIS) 2.5 MG TABS tablet Take 1 tablet (2.5 mg total) by mouth 2 (two) times daily.  . Calcium Carbonate-Vitamin D (CALCIUM 600+D PO) Take by mouth daily.  . cholecalciferol (VITAMIN D) 1000 UNITS tablet Take 2,000 Units by mouth daily.  . magnesium hydroxide (MILK OF MAGNESIA) 400 MG/5ML suspension Take 20 mLs by mouth daily as needed for mild constipation.  . metoprolol succinate (TOPROL-XL) 25 MG 24 hr tablet Take 12.5 mg by mouth daily.  . mirtazapine (REMERON) 15 MG tablet Take 15 mg at bedtime by mouth.  . Multiple Vitamins-Minerals (MULTI COMPLETE/IRON) TABS Take 1 tablet by mouth daily. 18-400 mg-mcg  . ofloxacin (OCUFLOX) 0.3 % ophthalmic solution Place 1 drop into both eyes 4 (four) times daily.  . polyethylene glycol (MIRALAX / GLYCOLAX) packet 1/2 dose daily for constipation  . saccharomyces boulardii (FLORASTOR) 250 MG capsule Take 250 mg by mouth daily.  . sodium chloride 1 G tablet Take 1 g by mouth 2 (two) times daily with a meal.  . trimethoprim (TRIMPEX) 100 MG tablet Take 100 mg by mouth daily.  Marland Kitchen UNABLE TO FIND Med Name: Preservative Free Tears every hour while awake in both eyes wait 3-5 min between drops. DO NOT wake resident for eye drops per MD  . [DISCONTINUED] carboxymethylcellulose (REFRESH PLUS) 0.5 % SOLN 1 drop every hour as needed.  . [DISCONTINUED] lactose free nutrition (BOOST) LIQD Take 237 mLs by mouth daily.  . [DISCONTINUED] Multiple Vitamin (MULTIVITAMIN) tablet Take 1 tablet by mouth daily.   No facility-administered encounter medications on file as of 09/03/2019.     Review of Systems  Constitutional: Positive for activity change. Negative for appetite change, chills and fever.  HENT: Negative for  congestion and sore throat.   Eyes: Positive for visual disturbance.  Respiratory: Negative for cough and shortness of breath.   Cardiovascular: Negative for chest pain, palpitations and leg swelling.  Gastrointestinal: Negative for abdominal pain, blood in stool, constipation, diarrhea, nausea and vomiting.  Genitourinary: Negative for dysuria.  Musculoskeletal: Positive for gait problem. Negative for arthralgias.       Using manual wheelchair for mobility  Skin: Negative for color change.       Right shin wound now scabbed over  Neurological: Positive for weakness. Negative for dizziness.  Hematological: Bruises/bleeds easily.  Psychiatric/Behavioral: Positive for confusion. Negative for agitation, behavioral problems and sleep disturbance. The patient is not nervous/anxious.     Immunization History  Administered Date(s) Administered  . H1N1 12/04/2008  . Influenza,inj,Quad PF,6+ Mos 09/11/2018  . Influenza-Unspecified 09/15/2003, 11/01/2010, 09/12/2011, 07/22/2016, 09/11/2017  . Pneumococcal Conjugate-13 04/14/2017  . Pneumococcal Polysaccharide-23 12/19/2008  . Td 09/15/2003, 01/06/2016  . Zoster Recombinat (Shingrix)  08/21/2017, 11/21/2017   Pertinent  Health Maintenance Due  Topic Date Due  . INFLUENZA VACCINE  06/22/2019  . DEXA SCAN  Completed  . PNA vac Low Risk Adult  Completed   Fall Risk  08/15/2018 07/12/2017 06/21/2017 05/04/2017 03/08/2017  Falls in the past year? No No No No No   Functional Status Survey:    Vitals:   09/03/19 1201  BP: 138/88  Pulse: 61  Resp: 19  Temp: (!) 97 F (36.1 C)  SpO2: 98%  Weight: 121 lb (54.9 kg)  Height: 5\' 3"  (1.6 m)   Body mass index is 21.43 kg/m. Physical Exam Vitals signs reviewed.  Constitutional:      General: She is not in acute distress.    Appearance: Normal appearance. She is not ill-appearing or toxic-appearing.  HENT:     Head: Normocephalic and atraumatic.  Eyes:     Extraocular Movements: Extraocular  movements intact.     Pupils: Pupils are equal, round, and reactive to light.  Cardiovascular:     Rate and Rhythm: Rhythm irregular.     Pulses: Normal pulses.     Heart sounds: Normal heart sounds.  Pulmonary:     Effort: Pulmonary effort is normal.     Breath sounds: Normal breath sounds. No wheezing, rhonchi or rales.  Abdominal:     General: Bowel sounds are normal. There is no distension.     Palpations: Abdomen is soft. There is no mass.     Tenderness: There is no abdominal tenderness.  Musculoskeletal: Normal range of motion.     Right lower leg: No edema.     Left lower leg: No edema.  Skin:    General: Skin is warm and dry.     Comments: Right shin with scabbing over large wound now open to air  Neurological:     General: No focal deficit present.     Mental Status: She is alert. Mental status is at baseline.     Gait: Gait abnormal.  Psychiatric:        Mood and Affect: Mood normal.     Labs reviewed: Recent Labs    01/03/19 02/18/19  NA 137 141  K 4.8 3.9  BUN 17 20  CREATININE 1.0 1.1   No results for input(s): AST, ALT, ALKPHOS, BILITOT, PROT, ALBUMIN in the last 8760 hours. Recent Labs    01/03/19 02/18/19 08/06/19  WBC 5.6 6.2 4.8  HGB 13.2 12.1 12.2  HCT 39 35* 36  PLT 229 210 209   Lab Results  Component Value Date   TSH 2.29 10/18/2018   No results found for: HGBA1C No results found for: CHOL, HDL, LDLCALC, LDLDIRECT, TRIG, CHOLHDL   Assessment/Plan: 1. Late onset Alzheimer's disease with behavioral disturbance ([place]) -not on any meds for this at this point -continue memory care support -appreciate activities in memory care assisting with regular communication with resident's husband to keep him informed and help with both resident and her spouse's spirits  2. Depression, major, single episode, moderate (HCC) -cont remeron therapy especially amid covid isolation where she can only minimally see her husband -seems to be doing better since  eyes are feeling better  3. Paroxysmal atrial fibrillation (HCC) -asymptomatic, cont eliquis and toprol xl -h/h stable and renal function also stable  4. Urge incontinence of urine -some urge, but mostly just functional at this point  5. Visual agnosia -ongoing, doing better after dry eyes addressed by Dr. Ellie Lunch  6. Laceration of skin  of right lower leg without complication, subsequent encounter -finally almost healed  7. DNR (do not resuscitate) - DNR (Do Not Resuscitate) order reentered    Family/ staff Communication: discussed with memory care nurse and notes reviewed  Labs/tests ordered:  No new  Aubreanna Percle L. Eugena Rhue, D.O. [facility] [address] [place],  [address] Cell Phone (Mon-Fri 8am-5pm):  [phone] On Call:  [phone] & follow prompts after 5pm & weekends Office Phone:  [phone] Office Fax:  [phone]

## 2019-09-04 ENCOUNTER — Encounter: Payer: Self-pay | Admitting: *Deleted

## 2019-09-04 DIAGNOSIS — Z20828 Contact with and (suspected) exposure to other viral communicable diseases: Secondary | ICD-10-CM | POA: Diagnosis not present

## 2019-09-04 DIAGNOSIS — Z9189 Other specified personal risk factors, not elsewhere classified: Secondary | ICD-10-CM | POA: Diagnosis not present

## 2019-09-09 DIAGNOSIS — Z9189 Other specified personal risk factors, not elsewhere classified: Secondary | ICD-10-CM | POA: Diagnosis not present

## 2019-09-13 DIAGNOSIS — Z20828 Contact with and (suspected) exposure to other viral communicable diseases: Secondary | ICD-10-CM | POA: Diagnosis not present

## 2019-09-23 DIAGNOSIS — Z9189 Other specified personal risk factors, not elsewhere classified: Secondary | ICD-10-CM | POA: Diagnosis not present

## 2019-10-02 DIAGNOSIS — Z9189 Other specified personal risk factors, not elsewhere classified: Secondary | ICD-10-CM | POA: Diagnosis not present

## 2019-10-08 DIAGNOSIS — Z9189 Other specified personal risk factors, not elsewhere classified: Secondary | ICD-10-CM | POA: Diagnosis not present

## 2019-10-10 DIAGNOSIS — H16123 Filamentary keratitis, bilateral: Secondary | ICD-10-CM | POA: Diagnosis not present

## 2019-10-15 DIAGNOSIS — Z9189 Other specified personal risk factors, not elsewhere classified: Secondary | ICD-10-CM | POA: Diagnosis not present

## 2019-10-31 ENCOUNTER — Non-Acute Institutional Stay: Payer: Medicare Other | Admitting: Adult Health

## 2019-10-31 DIAGNOSIS — F321 Major depressive disorder, single episode, moderate: Secondary | ICD-10-CM | POA: Diagnosis not present

## 2019-10-31 DIAGNOSIS — F0281 Dementia in other diseases classified elsewhere with behavioral disturbance: Secondary | ICD-10-CM

## 2019-10-31 DIAGNOSIS — G301 Alzheimer's disease with late onset: Secondary | ICD-10-CM | POA: Diagnosis not present

## 2019-10-31 DIAGNOSIS — I480 Paroxysmal atrial fibrillation: Secondary | ICD-10-CM

## 2019-10-31 DIAGNOSIS — I10 Essential (primary) hypertension: Secondary | ICD-10-CM

## 2019-10-31 DIAGNOSIS — R269 Unspecified abnormalities of gait and mobility: Secondary | ICD-10-CM | POA: Diagnosis not present

## 2019-10-31 DIAGNOSIS — E871 Hypo-osmolality and hyponatremia: Secondary | ICD-10-CM | POA: Diagnosis not present

## 2019-11-01 ENCOUNTER — Encounter: Payer: Self-pay | Admitting: Adult Health

## 2019-11-01 DIAGNOSIS — Z20828 Contact with and (suspected) exposure to other viral communicable diseases: Secondary | ICD-10-CM | POA: Diagnosis not present

## 2019-11-01 DIAGNOSIS — Z9189 Other specified personal risk factors, not elsewhere classified: Secondary | ICD-10-CM | POA: Diagnosis not present

## 2019-11-01 NOTE — Progress Notes (Signed)
Location:  Occupational psychologist of Service:  ALF (13) Provider:   [name], ANP [place] [phone]   [name], DO  Patient Care Team: [name], DO as PCP - General (Geriatric Medicine) [name], MD as Consulting Physician (Cardiology) Community, Well [name], MD as Consulting Physician (Gastroenterology) [name], [name], MD as Consulting Physician (Dermatology) [name], MD as Consulting Physician (Cardiology) [name], MD as Consulting Physician (Ophthalmology) Zimmer, [name], NP as Nurse Practitioner (Internal Medicine) [name], MD as Consulting Physician (Gynecology)  Extended Emergency Contact Information Primary Emergency Contact: [facility] Address: [address]          [place], [place] [address] [name] of [place] Phone: [phone] Work Phone: [phone] Mobile Phone: [phone] Relation: Spouse  Code Status:  DNR Goals of care: Advanced Directive information Advanced Directives 09/03/2019  Does Patient Have a Medical Advance Directive? Yes  Type of Paramedic of [place];Living will;Out of facility DNR (pink MOST or yellow form)  Does patient want to make changes to medical advance directive? No - Patient declined  Copy of [place] in Chart? Yes - validated most recent copy scanned in chart (See row information)  Would patient like information on creating a medical advance directive? -  Pre-existing out of facility DNR order (yellow form or pink MOST form) Yellow form placed in chart (order not valid for inpatient use)     Chief Complaint  Patient presents with  . Medical Management of Chronic Issues    HPI:  Pt is a 85 y.o. female seen today for medical management of chronic diseases.  She resides in the memory care setting due to a hx of AD. Over the past  year she is progressively more confused and weaker. The caregiver at the bedside stated that she can still ambulate with assistance but is very unsteady and needs assistance. She has a hx of falls and this week was found on the floor due to getting up without help. No serious injury but a small bruise is noted to her left forearm. She has variable intake and her weight has fluctuated over the past few months. She has periods of anxiety and restlessness but can be redirected. Has delusions at times as well. No issues with coughing or chewing. BP is controlled. Hx of afib with no new issues of palpitations, doe, edema, etc. She has been having issues with filamentary keratitis and is on antibiotics eye medication for this reason. Today she denies any eye discomfort.   Wt Readings from Last 3 Encounters:  11/01/19 116 lb (52.6 kg)  09/03/19 121 lb (54.9 kg)  07/04/19 117 lb (53.1 kg)    Past Medical History:  Diagnosis Date  . Actinic keratoses   . Allergy   . Alzheimer disease ([place]) 05/21/2015  . Anxiety   . Arrhythmia    atrial fib  . Atrial fibrillation ([place]) 12/2012  . Basal cell carcinoma of skin   . Cataracts, both eyes 2005, 2013   Cataract extraction/IOL implant  . Current use of long term anticoagulation 12/2012   Eliquis  . Depression   . Diverticulosis 2008  . Fracture of scaphoid bone of hand 08/05/2004  . History of UTI   . Hyperlipidemia   . Hypertension   . Hyponatremia    SIADH  . Low sodium levels   . Mild cognitive impairment   . Osteopenia   .  Rosacea   . Squamous cell carcinoma   . Thyroid nodule    Past Surgical History:  Procedure Laterality Date  . ABDOMINAL HYSTERECTOMY  1970   TAH-BSO  . cataracts Bilateral 2/05 and 3/05  . CESAREAN SECTION     questionable    Allergies  Allergen Reactions  . Solifenacin Other (See Comments)  . Ciprofloxacin Other (See Comments)    Reaction unknown  . Latex Other (See Comments)    Reaction unknown  . Macrobid  [Nitrofurantoin Macrocrystal] Nausea And Vomiting  . Macrodantin [Nitrofurantoin] Nausea Only    Upset stomach  . Other Other (See Comments)    Popcorn- Diverticulosis  . Strawberry Extract Other (See Comments)    Diverticulosis   . Tramadol Nausea Only    Outpatient Encounter Medications as of 10/31/2019  Medication Sig  . acetaminophen (TYLENOL) 325 MG tablet Take 650 mg by mouth every 6 (six) hours as needed.   Marland Kitchen apixaban (ELIQUIS) 2.5 MG TABS tablet Take 1 tablet (2.5 mg total) by mouth 2 (two) times daily.  . Calcium Carbonate-Vitamin D (CALCIUM 600+D PO) Take by mouth daily.  . cholecalciferol (VITAMIN D) 1000 UNITS tablet Take 2,000 Units by mouth daily.  . magnesium hydroxide (MILK OF MAGNESIA) 400 MG/5ML suspension Take 20 mLs by mouth daily as needed for mild constipation.  . metoprolol succinate (TOPROL-XL) 25 MG 24 hr tablet Take 12.5 mg by mouth daily.  . mirtazapine (REMERON) 15 MG tablet Take 15 mg at bedtime by mouth.  Marland Kitchen ofloxacin (OCUFLOX) 0.3 % ophthalmic solution Place 1 drop into both eyes 4 (four) times daily.  . polyethylene glycol (MIRALAX / GLYCOLAX) packet 1/2 dose daily for constipation  . saccharomyces boulardii (FLORASTOR) 250 MG capsule Take 250 mg by mouth daily.  . sodium chloride 1 G tablet Take 1 g by mouth 2 (two) times daily with a meal.  . trimethoprim (TRIMPEX) 100 MG tablet Take 100 mg by mouth daily.  Marland Kitchen trimethoprim-polymyxin b (POLYTRIM) ophthalmic solution Place 1 drop into the left eye 4 (four) times daily.  . Multiple Vitamins-Minerals (MULTI COMPLETE/IRON) TABS Take 1 tablet by mouth daily. 18-400 mg-mcg  . UNABLE TO FIND Med Name: Preservative Free Tears every hour while awake in both eyes wait 3-5 min between drops. DO NOT wake resident for eye drops per MD   No facility-administered encounter medications on file as of 10/31/2019.    Review of Systems  Unable to perform ROS: Dementia    Immunization History  Administered Date(s)  Administered  . H1N1 12/04/2008  . Influenza, High Dose Seasonal PF 09/19/2019  . Influenza,inj,Quad PF,6+ Mos 09/11/2018  . Influenza-Unspecified 09/15/2003, 11/01/2010, 09/12/2011, 07/22/2016, 09/11/2017  . Pneumococcal Conjugate-13 04/14/2017  . Pneumococcal Polysaccharide-23 12/19/2008  . Td 09/15/2003, 01/06/2016  . Zoster Recombinat (Shingrix) 08/21/2017, 11/21/2017   Pertinent  Health Maintenance Due  Topic Date Due  . INFLUENZA VACCINE  Completed  . DEXA SCAN  Completed  . PNA vac Low Risk Adult  Completed   Fall Risk  08/15/2018 07/12/2017 06/21/2017 05/04/2017 03/08/2017  Falls in the past year? No No No No No   Functional Status Survey:    Vitals:   11/01/19 0850  BP: 128/65  Pulse: 65  Resp: 16  Temp: (!) 96.2 F (35.7 C)  SpO2: 95%  Weight: 116 lb (52.6 kg)   Body mass index is 20.55 kg/m. Physical Exam Vitals and nursing note reviewed.  Constitutional:      General: She is not in acute distress.  Appearance: She is not diaphoretic.  HENT:     Head: Normocephalic and atraumatic.  Neck:     Vascular: No JVD.  Cardiovascular:     Rate and Rhythm: Normal rate. Rhythm irregular.     Heart sounds: No murmur.  Pulmonary:     Effort: Pulmonary effort is normal. No respiratory distress.     Breath sounds: Normal breath sounds. No wheezing.  Abdominal:     General: Bowel sounds are normal. There is no distension.     Palpations: Abdomen is soft.  Musculoskeletal:     Cervical back: No tenderness.     Right lower leg: No edema.     Left lower leg: No edema.  Lymphadenopathy:     Cervical: No cervical adenopathy.  Skin:    General: Skin is warm and dry.  Neurological:     General: No focal deficit present.     Mental Status: She is alert. Mental status is at baseline.     Gait: Gait abnormal.  Psychiatric:     Comments: Slightly irritated     Labs reviewed: Recent Labs    01/03/19 0000 02/18/19 0000  NA 137 141  K 4.8 3.9  BUN 17 20    CREATININE 1.0 1.1   No results for input(s): AST, ALT, ALKPHOS, BILITOT, PROT, ALBUMIN in the last 8760 hours. Recent Labs    01/03/19 0000 02/18/19 0000 08/06/19 0000  WBC 5.6 6.2 4.8  HGB 13.2 12.1 12.2  HCT 39 35* 36  PLT 229 210 209   Lab Results  Component Value Date   TSH 2.29 10/18/2018   No results found for: HGBA1C No results found for: CHOL, HDL, LDLCALC, LDLDIRECT, TRIG, CHOLHDL  Significant Diagnostic Results in last 30 days:  No results found.  Assessment/Plan 1. Late onset Alzheimer's disease with behavioral disturbance ([place]) Progressive decline in cognition and physical function c/w the disease. Continue supportive care in the skilled environment.  2. Gait abnormality High fall risk. Continue fall prec and frequent monitoring. Use walker and 1 person assistance with all ambulation or transfers.   3. Paroxysmal atrial fibrillation (HCC) Rate is controlled with Toprol 12.5 mg qd  Continue Eliquis for CVA risk reduction   4. Essential hypertension Controlled.    5. Hyponatremia Continue sodium 1 gram qd and monitor sodium level annually  6. Depression, major, single episode, moderate ([place]) She has periods of anxiety and restlessness and for this reason I would not recommend a GDR with the Remeron 15 mg qhs She does not appear depressed on exam but this is difficult to sort out given her dementia.     Family/ staff Communication: nurse  Labs/tests ordered:  NA

## 2019-11-11 DIAGNOSIS — Z20828 Contact with and (suspected) exposure to other viral communicable diseases: Secondary | ICD-10-CM | POA: Diagnosis not present

## 2019-11-11 DIAGNOSIS — Z9189 Other specified personal risk factors, not elsewhere classified: Secondary | ICD-10-CM | POA: Diagnosis not present

## 2019-11-18 DIAGNOSIS — Z9189 Other specified personal risk factors, not elsewhere classified: Secondary | ICD-10-CM | POA: Diagnosis not present

## 2019-11-18 DIAGNOSIS — Z20828 Contact with and (suspected) exposure to other viral communicable diseases: Secondary | ICD-10-CM | POA: Diagnosis not present

## 2019-11-26 DIAGNOSIS — Z23 Encounter for immunization: Secondary | ICD-10-CM | POA: Diagnosis not present

## 2019-12-02 DIAGNOSIS — Z20828 Contact with and (suspected) exposure to other viral communicable diseases: Secondary | ICD-10-CM | POA: Diagnosis not present

## 2019-12-02 DIAGNOSIS — Z9189 Other specified personal risk factors, not elsewhere classified: Secondary | ICD-10-CM | POA: Diagnosis not present

## 2019-12-06 ENCOUNTER — Non-Acute Institutional Stay: Payer: Medicare Other | Admitting: Adult Health

## 2019-12-06 ENCOUNTER — Encounter: Payer: Self-pay | Admitting: Adult Health

## 2019-12-06 DIAGNOSIS — L089 Local infection of the skin and subcutaneous tissue, unspecified: Secondary | ICD-10-CM | POA: Diagnosis not present

## 2019-12-06 DIAGNOSIS — L03115 Cellulitis of right lower limb: Secondary | ICD-10-CM

## 2019-12-06 DIAGNOSIS — T148XXA Other injury of unspecified body region, initial encounter: Secondary | ICD-10-CM

## 2019-12-06 NOTE — Progress Notes (Signed)
Location:  Occupational psychologist of Service:  ALF (13) Provider:   [name], ANP [place] [phone]   [name], DO  Patient Care Team: [name], DO as PCP - General (Geriatric Medicine) [name], MD as Consulting Physician (Cardiology) Community, Well [name], MD as Consulting Physician (Gastroenterology) [name], [name], MD as Consulting Physician (Dermatology) [name], MD as Consulting Physician (Cardiology) [name], MD as Consulting Physician (Ophthalmology) Zimmer, [name], NP as Nurse Practitioner (Internal Medicine) [name], MD as Consulting Physician (Gynecology)  Extended Emergency Contact Information Primary Emergency Contact: [facility] Address: [address]          [place], [place] [address] [name] of [place] Phone: [phone] Work Phone: [phone] Mobile Phone: [phone] Relation: Spouse  Code Status:  DNR Goals of care: Advanced Directive information Advanced Directives 09/03/2019  Does Patient Have a Medical Advance Directive? Yes  Type of Paramedic of [place];Living will;Out of facility DNR (pink MOST or yellow form)  Does patient want to make changes to medical advance directive? No - Patient declined  Copy of [place] in Chart? Yes - validated most recent copy scanned in chart (See row information)  Would patient like information on creating a medical advance directive? -  Pre-existing out of facility DNR order (yellow form or pink MOST form) Yellow form placed in chart (order not valid for inpatient use)     Chief Complaint  Patient presents with  . Acute Visit    leg wound with redness and swelling    HPI:  Pt is a 86 y.o. female seen today for an acute visit for leg wound with redness and swelling. The nurse reports that she has an infected  skin tear to the RLE. They have been using a poly mem dressing and she has been on keflex since 1/7.  There has been no improvement in warmth, redness and swelling of the lower ext. She has not had a fever and is her usual state of health otherwise.    Past Medical History:  Diagnosis Date  . Actinic keratoses   . Allergy   . Alzheimer disease ([place]) 05/21/2015  . Anxiety   . Arrhythmia    atrial fib  . Atrial fibrillation ([place]) 12/2012  . Basal cell carcinoma of skin   . Cataracts, both eyes 2005, 2013   Cataract extraction/IOL implant  . Current use of long term anticoagulation 12/2012   Eliquis  . Depression   . Diverticulosis 2008  . Fracture of scaphoid bone of hand 08/05/2004  . History of UTI   . Hyperlipidemia   . Hypertension   . Hyponatremia    SIADH  . Low sodium levels   . Mild cognitive impairment   . Osteopenia   . Rosacea   . Squamous cell carcinoma   . Thyroid nodule    Past Surgical History:  Procedure Laterality Date  . ABDOMINAL HYSTERECTOMY  1970   TAH-BSO  . cataracts Bilateral 2/05 and 3/05  . CESAREAN SECTION     questionable    Allergies  Allergen Reactions  . Solifenacin Other (See Comments)  . Ciprofloxacin Other (See Comments)    Reaction unknown  . Latex Other (See Comments)    Reaction unknown  . Macrobid [Nitrofurantoin Macrocrystal] Nausea And Vomiting  . Macrodantin [Nitrofurantoin] Nausea Only    Upset stomach  . Other Other (See Comments)  Popcorn- Diverticulosis  . Strawberry Extract Other (See Comments)    Diverticulosis   . Tramadol Nausea Only    Outpatient Encounter Medications as of 12/06/2019  Medication Sig  . cephALEXin (KEFLEX) 500 MG capsule Take 500 mg by mouth 2 (two) times daily.  Marland Kitchen acetaminophen (TYLENOL) 325 MG tablet Take 650 mg by mouth every 6 (six) hours as needed.   Marland Kitchen apixaban (ELIQUIS) 2.5 MG TABS tablet Take 1 tablet (2.5 mg total) by mouth 2 (two) times daily.  . Calcium Carbonate-Vitamin D (CALCIUM  600+D PO) Take by mouth daily.  . cholecalciferol (VITAMIN D) 1000 UNITS tablet Take 2,000 Units by mouth daily.  . magnesium hydroxide (MILK OF MAGNESIA) 400 MG/5ML suspension Take 20 mLs by mouth daily as needed for mild constipation.  . metoprolol succinate (TOPROL-XL) 25 MG 24 hr tablet Take 12.5 mg by mouth daily.  . mirtazapine (REMERON) 15 MG tablet Take 15 mg at bedtime by mouth.  . Multiple Vitamins-Minerals (MULTI COMPLETE/IRON) TABS Take 1 tablet by mouth daily. 18-400 mg-mcg  . ofloxacin (OCUFLOX) 0.3 % ophthalmic solution Place 1 drop into both eyes 4 (four) times daily.  . polyethylene glycol (MIRALAX / GLYCOLAX) packet 1/2 dose daily for constipation  . saccharomyces boulardii (FLORASTOR) 250 MG capsule Take 250 mg by mouth daily.  . sodium chloride 1 G tablet Take 1 g by mouth 2 (two) times daily with a meal.  . trimethoprim (TRIMPEX) 100 MG tablet Take 100 mg by mouth daily.  Marland Kitchen trimethoprim-polymyxin b (POLYTRIM) ophthalmic solution Place 1 drop into the left eye 4 (four) times daily.  Marland Kitchen UNABLE TO FIND Med Name: Preservative Free Tears every hour while awake in both eyes wait 3-5 min between drops. DO NOT wake resident for eye drops per MD   No facility-administered encounter medications on file as of 12/06/2019.    Review of Systems  Constitutional: Negative for activity change, appetite change, chills, diaphoresis, fatigue, fever and unexpected weight change.  Skin: Positive for color change and wound. Negative for pallor and rash.    Immunization History  Administered Date(s) Administered  . H1N1 12/04/2008  . Influenza, High Dose Seasonal PF 09/19/2019  . Influenza,inj,Quad PF,6+ Mos 09/11/2018  . Influenza-Unspecified 09/15/2003, 11/01/2010, 09/12/2011, 07/22/2016, 09/11/2017  . Moderna SARS-COVID-2 Vaccination 11/26/2019  . Pneumococcal Conjugate-13 04/14/2017  . Pneumococcal Polysaccharide-23 12/19/2008  . Td 09/15/2003, 01/06/2016  . Zoster Recombinat  (Shingrix) 08/21/2017, 11/21/2017   Pertinent  Health Maintenance Due  Topic Date Due  . INFLUENZA VACCINE  Completed  . DEXA SCAN  Completed  . PNA vac Low Risk Adult  Completed   Fall Risk  08/15/2018 07/12/2017 06/21/2017 05/04/2017 03/08/2017  Falls in the past year? No No No No No   Functional Status Survey:    Vitals:   12/06/19 1203  Temp: (!) 97.4 F (36.3 C)   There is no height or weight on file to calculate BMI. Physical Exam Musculoskeletal:     Right lower leg: Edema (+1) present.     Left lower leg: Edema (trace) present.  Skin:    General: Skin is warm and dry.     Findings: Erythema (RLE from foot to mid calf. Warmth noted. ) present.     Comments: Right posterior calf with wound 80% pink 20% yellow/white. NO drainage noted. Surrounding erythema noted and warmth. Tenderness as well.   Neurological:     Mental Status: She is alert.     Labs reviewed: Recent Labs    01/03/19  0000 02/18/19 0000  NA 137 141  K 4.8 3.9  BUN 17 20  CREATININE 1.0 1.1   No results for input(s): AST, ALT, ALKPHOS, BILITOT, PROT, ALBUMIN in the last 8760 hours. Recent Labs    01/03/19 0000 02/18/19 0000 08/06/19 0000  WBC 5.6 6.2 4.8  HGB 13.2 12.1 12.2  HCT 39 35* 36  PLT 229 210 209   Lab Results  Component Value Date   TSH 2.29 10/18/2018   No results found for: HGBA1C No results found for: CHOL, HDL, LDLCALC, LDLDIRECT, TRIG, CHOLHDL  Significant Diagnostic Results in last 30 days:  No results found.  Assessment/Plan 1. Cellulitis of right lower extremity No improvement despite keflex treatment x 1 week. Will discontinue keflex and begin Doxycycline 100 mg bid x 10 days with food. Keep leg elevated and monitor for spreading redness, fever, malaise etc.   2. Infected skin tear See #1 Improved necrotic tissue.  Change dressing to silver agent, nurse to follow wound management.     Family/ staff Communication: discussed with nurse Labs/tests ordered:  NA

## 2019-12-09 DIAGNOSIS — Z20828 Contact with and (suspected) exposure to other viral communicable diseases: Secondary | ICD-10-CM | POA: Diagnosis not present

## 2019-12-09 DIAGNOSIS — Z9189 Other specified personal risk factors, not elsewhere classified: Secondary | ICD-10-CM | POA: Diagnosis not present

## 2019-12-12 DIAGNOSIS — Z20828 Contact with and (suspected) exposure to other viral communicable diseases: Secondary | ICD-10-CM | POA: Diagnosis not present

## 2019-12-12 DIAGNOSIS — Z9189 Other specified personal risk factors, not elsewhere classified: Secondary | ICD-10-CM | POA: Diagnosis not present

## 2019-12-16 DIAGNOSIS — Z20828 Contact with and (suspected) exposure to other viral communicable diseases: Secondary | ICD-10-CM | POA: Diagnosis not present

## 2019-12-16 DIAGNOSIS — Z9189 Other specified personal risk factors, not elsewhere classified: Secondary | ICD-10-CM | POA: Diagnosis not present

## 2019-12-23 ENCOUNTER — Non-Acute Institutional Stay: Payer: Medicare Other | Admitting: Adult Health

## 2019-12-23 ENCOUNTER — Encounter: Payer: Self-pay | Admitting: Adult Health

## 2019-12-23 DIAGNOSIS — I480 Paroxysmal atrial fibrillation: Secondary | ICD-10-CM | POA: Diagnosis not present

## 2019-12-23 DIAGNOSIS — M79604 Pain in right leg: Secondary | ICD-10-CM | POA: Diagnosis not present

## 2019-12-23 DIAGNOSIS — S81811D Laceration without foreign body, right lower leg, subsequent encounter: Secondary | ICD-10-CM | POA: Diagnosis not present

## 2019-12-23 DIAGNOSIS — F0281 Dementia in other diseases classified elsewhere with behavioral disturbance: Secondary | ICD-10-CM | POA: Diagnosis not present

## 2019-12-23 DIAGNOSIS — E871 Hypo-osmolality and hyponatremia: Secondary | ICD-10-CM | POA: Diagnosis not present

## 2019-12-23 DIAGNOSIS — Z9189 Other specified personal risk factors, not elsewhere classified: Secondary | ICD-10-CM | POA: Diagnosis not present

## 2019-12-23 DIAGNOSIS — H16123 Filamentary keratitis, bilateral: Secondary | ICD-10-CM | POA: Diagnosis not present

## 2019-12-23 DIAGNOSIS — Z87440 Personal history of urinary (tract) infections: Secondary | ICD-10-CM | POA: Diagnosis not present

## 2019-12-23 DIAGNOSIS — G301 Alzheimer's disease with late onset: Secondary | ICD-10-CM | POA: Diagnosis not present

## 2019-12-23 DIAGNOSIS — M79605 Pain in left leg: Secondary | ICD-10-CM

## 2019-12-23 DIAGNOSIS — I10 Essential (primary) hypertension: Secondary | ICD-10-CM | POA: Diagnosis not present

## 2019-12-23 DIAGNOSIS — Z20828 Contact with and (suspected) exposure to other viral communicable diseases: Secondary | ICD-10-CM | POA: Diagnosis not present

## 2019-12-23 DIAGNOSIS — F02818 Dementia in other diseases classified elsewhere, unspecified severity, with other behavioral disturbance: Secondary | ICD-10-CM

## 2019-12-23 NOTE — Progress Notes (Signed)
Location:  Occupational psychologist of Service:  ALF (13) Provider:   [name], ANP [place] [phone]  [name], DO  Patient Care Team: [name], DO as PCP - General (Geriatric Medicine) [name], MD as Consulting Physician (Cardiology) Community, Well [name], MD as Consulting Physician (Gastroenterology) [name], [name], MD as Consulting Physician (Dermatology) [name], MD as Consulting Physician (Cardiology) [name], MD as Consulting Physician (Ophthalmology) Zimmer, [name], NP as Nurse Practitioner (Internal Medicine) [name], MD as Consulting Physician (Gynecology)  Extended Emergency Contact Information Primary Emergency Contact: [facility] Address: [address]          [place], [place] [address] [name] of [place] Phone: [phone] Work Phone: [phone] Mobile Phone: [phone] Relation: Spouse  Code Status:  DNR Goals of care: Advanced Directive information Advanced Directives 09/03/2019  Does Patient Have a Medical Advance Directive? Yes  Type of Paramedic of [place];Living will;Out of facility DNR (pink MOST or yellow form)  Does patient want to make changes to medical advance directive? No - Patient declined  Copy of [place] in Chart? Yes - validated most recent copy scanned in chart (See row information)  Would patient like information on creating a medical advance directive? -  Pre-existing out of facility DNR order (yellow form or pink MOST form) Yellow form placed in chart (order not valid for inpatient use)     Chief Complaint  Patient presents with  . Medical Management of Chronic Issues    HPI:  Pt is a 86 y.o. female seen today for medical management of chronic diseases.    AD: severe in nature. Needs assistance with all ADLs and regular cuing  and re orientation. Not able to complete MMSE. Has period of anxiety/paranoia but significant change in behavior.   She has filamentary keratitis and wears contacts and is on drops and ointment. No reported issues at this time. She periodically has redness to both lids and irritation.   She was treated for cellulitis two weeks ago due to an infected skin tear and this has resolved. The skin tear is healing per staff with dressing changes. No fever or increased drainage.   She has very tender and sensitive skin to both legs. Avoid other touching them. No severe pain reported. Sleeping well per staff.   Afib rate controlled. No sob, cp, doe, increased edema.   No recent reports of urinary symptoms. Has a hx of recurrent UTI and takes trimethoprim.   Past Medical History:  Diagnosis Date  . Actinic keratoses   . Allergy   . Alzheimer disease ([place]) 05/21/2015  . Anxiety   . Arrhythmia    atrial fib  . Atrial fibrillation ([place]) 12/2012  . Basal cell carcinoma of skin   . Cataracts, both eyes 2005, 2013   Cataract extraction/IOL implant  . Current use of long term anticoagulation 12/2012   Eliquis  . Depression   . Diverticulosis 2008  . Fracture of scaphoid bone of hand 08/05/2004  . History of UTI   . Hyperlipidemia   . Hypertension   . Hyponatremia    SIADH  . Low sodium levels   . Mild cognitive impairment   . Osteopenia   . Rosacea   . Squamous cell carcinoma   . Thyroid nodule    Past Surgical History:  Procedure Laterality Date  . ABDOMINAL HYSTERECTOMY  1970  TAH-BSO  . cataracts Bilateral 2/05 and 3/05  . CESAREAN SECTION     questionable    Allergies  Allergen Reactions  . Aricept [Donepezil]   . Solifenacin Other (See Comments)  . Ciprofloxacin Other (See Comments)    Reaction unknown  . Latex Other (See Comments)    Reaction unknown  . Macrobid [Nitrofurantoin Macrocrystal] Nausea And Vomiting  . Macrodantin [Nitrofurantoin] Nausea Only    Upset  stomach  . Other Other (See Comments)    Popcorn- Diverticulosis  . Strawberry Extract Other (See Comments)    Diverticulosis   . Tramadol Nausea Only    Outpatient Encounter Medications as of 12/23/2019  Medication Sig  . acetaminophen (TYLENOL) 325 MG tablet Take 650 mg by mouth every 6 (six) hours as needed.   Marland Kitchen apixaban (ELIQUIS) 2.5 MG TABS tablet Take 1 tablet (2.5 mg total) by mouth 2 (two) times daily.  . Calcium Carbonate-Vitamin D (CALCIUM 600+D PO) Take by mouth daily.  . cholecalciferol (VITAMIN D) 1000 UNITS tablet Take 2,000 Units by mouth daily.  . magnesium hydroxide (MILK OF MAGNESIA) 400 MG/5ML suspension Take 20 mLs by mouth daily as needed for mild constipation.  . metoprolol succinate (TOPROL-XL) 25 MG 24 hr tablet Take 12.5 mg by mouth daily.  . mirtazapine (REMERON) 15 MG tablet Take 15 mg at bedtime by mouth.  . Multiple Vitamins-Minerals (MULTI COMPLETE/IRON) TABS Take 1 tablet by mouth daily. 18-400 mg-mcg  . ofloxacin (OCUFLOX) 0.3 % ophthalmic solution Place 1 drop into both eyes 4 (four) times daily.  . polyethylene glycol (MIRALAX / GLYCOLAX) packet 1/2 dose daily for constipation  . saccharomyces boulardii (FLORASTOR) 250 MG capsule Take 250 mg by mouth daily.  . sodium chloride 1 G tablet Take 1 g by mouth 2 (two) times daily with a meal.  . trimethoprim (TRIMPEX) 100 MG tablet Take 100 mg by mouth daily.  Marland Kitchen trimethoprim-polymyxin b (POLYTRIM) ophthalmic solution Place 1 drop into the left eye 4 (four) times daily.  Marland Kitchen UNABLE TO FIND Med Name: Preservative Free Tears every hour while awake in both eyes wait 3-5 min between drops. DO NOT wake resident for eye drops per MD  . [DISCONTINUED] cephALEXin (KEFLEX) 500 MG capsule Take 500 mg by mouth 2 (two) times daily.  . [DISCONTINUED] doxycycline (DORYX) 100 MG EC tablet Take 100 mg by mouth 2 (two) times daily.   No facility-administered encounter medications on file as of 12/23/2019.    Review of Systems    Unable to perform ROS: Dementia    Immunization History  Administered Date(s) Administered  . H1N1 12/04/2008  . Influenza, High Dose Seasonal PF 09/19/2019  . Influenza,inj,Quad PF,6+ Mos 09/11/2018  . Influenza-Unspecified 09/15/2003, 11/01/2010, 09/12/2011, 07/22/2016, 09/11/2017  . Moderna SARS-COVID-2 Vaccination 11/26/2019  . Pneumococcal Conjugate-13 04/14/2017  . Pneumococcal Polysaccharide-23 12/19/2008  . Td 09/15/2003, 01/06/2016  . Zoster Recombinat (Shingrix) 08/21/2017, 11/21/2017   Pertinent  Health Maintenance Due  Topic Date Due  . INFLUENZA VACCINE  Completed  . DEXA SCAN  Completed  . PNA vac Low Risk Adult  Completed   Fall Risk  08/15/2018 07/12/2017 06/21/2017 05/04/2017 03/08/2017  Falls in the past year? No No No No No   Functional Status Survey:    There were no vitals filed for this visit. There is no height or weight on file to calculate BMI. Physical Exam Vitals and nursing note reviewed.  Constitutional:      General: She is not in acute distress.  Appearance: She is not diaphoretic.  HENT:     Head: Normocephalic and atraumatic.  Neck:     Vascular: No JVD.  Cardiovascular:     Rate and Rhythm: Normal rate. Rhythm irregular.     Heart sounds: No murmur.  Pulmonary:     Effort: Pulmonary effort is normal. No respiratory distress.     Breath sounds: Normal breath sounds. No wheezing.  Abdominal:     General: Bowel sounds are normal.     Palpations: Abdomen is soft.  Musculoskeletal:     Comments: Trace edema to BLE.   Skin:    General: Skin is warm and dry.     Comments: RLE posterior skin tear with 100 dark red tissue. Mild surrounding erythema. No swelling. Scant serosang drainage.   Neurological:     General: No focal deficit present.     Mental Status: She is alert. Mental status is at baseline.  Psychiatric:        Mood and Affect: Mood normal.     Labs reviewed: Recent Labs    01/03/19 0000 02/18/19 0000  NA 137 141  K  4.8 3.9  BUN 17 20  CREATININE 1.0 1.1   No results for input(s): AST, ALT, ALKPHOS, BILITOT, PROT, ALBUMIN in the last 8760 hours. Recent Labs    01/03/19 0000 02/18/19 0000 08/06/19 0000  WBC 5.6 6.2 4.8  HGB 13.2 12.1 12.2  HCT 39 35* 36  PLT 229 210 209   Lab Results  Component Value Date   TSH 2.29 10/18/2018   No results found for: HGBA1C No results found for: CHOL, HDL, LDLCALC, LDLDIRECT, TRIG, CHOLHDL  Significant Diagnostic Results in last 30 days:  No results found.  Assessment/Plan 1. Late onset Alzheimer's disease with behavioral disturbance ([place]) Progressive decline in cognition and physical function c/w the disease. Continue supportive care in the skilled environment. Continues on remeron 15 mg qhs for mood, sleep, and appetite. Would not taper as she does continue to have some anxiety/paranoia   2. Paroxysmal atrial fibrillation ([facility]) Rate controlled with metoprolol 12.5 mg qd   3. Essential hypertension Controlled   4. Pain in both lower extremities Very sensitive skin to both lower ext, ?neuropathy.   Would not begin meds at this time as it is not that bothersome to her. If moderate to severe pain would try neurontin.   5. Hyponatremia Monitor BMP Annually  Continue sodium tablets 1 gram bid   6. 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   8. Right lower ext skin tear Cellulitis has resolved, wound is healing.  Continue polymem dressing changes, q 3 days     Family/ staff Communication: discussed her care with the nurse  Labs/tests ordered:  NA

## 2019-12-24 DIAGNOSIS — Z23 Encounter for immunization: Secondary | ICD-10-CM | POA: Diagnosis not present

## 2019-12-31 ENCOUNTER — Encounter: Payer: Self-pay | Admitting: Internal Medicine

## 2020-01-01 DIAGNOSIS — H16123 Filamentary keratitis, bilateral: Secondary | ICD-10-CM | POA: Diagnosis not present

## 2020-01-11 ENCOUNTER — Encounter: Payer: Self-pay | Admitting: Internal Medicine

## 2020-01-13 ENCOUNTER — Encounter: Payer: Self-pay | Admitting: Internal Medicine

## 2020-01-14 ENCOUNTER — Non-Acute Institutional Stay: Payer: Medicare Other | Admitting: Internal Medicine

## 2020-01-14 ENCOUNTER — Encounter: Payer: Self-pay | Admitting: Internal Medicine

## 2020-01-14 DIAGNOSIS — F0281 Dementia in other diseases classified elsewhere with behavioral disturbance: Secondary | ICD-10-CM | POA: Diagnosis not present

## 2020-01-14 DIAGNOSIS — S81812A Laceration without foreign body, left lower leg, initial encounter: Secondary | ICD-10-CM

## 2020-01-14 DIAGNOSIS — G301 Alzheimer's disease with late onset: Secondary | ICD-10-CM | POA: Diagnosis not present

## 2020-01-14 DIAGNOSIS — M79605 Pain in left leg: Secondary | ICD-10-CM

## 2020-01-14 DIAGNOSIS — M79604 Pain in right leg: Secondary | ICD-10-CM | POA: Diagnosis not present

## 2020-01-14 DIAGNOSIS — F02818 Dementia in other diseases classified elsewhere, unspecified severity, with other behavioral disturbance: Secondary | ICD-10-CM

## 2020-01-14 NOTE — Progress Notes (Signed)
Patient ID: Cathy Dickerson, female   DOB: [birth year], 86 y.o.   MRN: [id number]  Location:   [facility] Room Number: [place] of Service:  ALF [phone]) Provider:   [name], DO  Patient Care Team: [name], DO as PCP - General (Geriatric Medicine) [name], MD as Consulting Physician (Cardiology) Community, Well [name], MD as Consulting Physician (Gastroenterology) [name], [name], MD as Consulting Physician (Dermatology) [name], MD as Consulting Physician (Cardiology) [name], MD as Consulting Physician (Ophthalmology) Zimmer, [name], NP as Nurse Practitioner (Internal Medicine) [name], MD as Consulting Physician (Gynecology)  Extended Emergency Contact Information Primary Emergency Contact: Tomaso,[name] Address: [address]          [place], [place] [address] [name] of [place] Phone: [phone] Work Phone: [phone] Mobile Phone: [phone] Relation: Spouse  Code Status:  DNR Goals of care: Advanced Directive information Advanced Directives 01/14/2020  Does Patient Have a Medical Advance Directive? Yes  Type of Advance Directive Out of facility DNR (pink MOST or yellow form)  Does patient want to make changes to medical advance directive? No - Guardian declined  Copy of [place] in Chart? -  Would patient like information on creating a medical advance directive? -  Pre-existing out of facility DNR order (yellow form or pink MOST form) -     Chief Complaint  Patient presents with  . Acute Visit    Leg wound     HPI:  Pt is a 86 y.o. female with h/o Alzheimer's disease, frailty, pafib seen today for an acute visit for new leg wound and her husband's concerns about ongoing recurrent injuries to her legs. When seen, she was resistant as usual to having her legs touched.  We had to distract her with snacks of  lorna dunes and ginger ale.  The laceration of her right leg was healing but the left had an approximately 1 inch square wound on the proximal lateral aspect that resembles the piece of the wheelchair used to move the footrests in and out.  That was the fresh one and removing the dressing, it bled again.    VS reviewed and clearly not confirmed manually.    Past Medical History:  Diagnosis Date  . Actinic keratoses   . Allergy   . Alzheimer disease ([place]) 05/21/2015  . Anxiety   . Arrhythmia    atrial fib  . Atrial fibrillation ([place]) 12/2012  . Basal cell carcinoma of skin   . Cataracts, both eyes 2005, 2013   Cataract extraction/IOL implant  . Current use of long term anticoagulation 12/2012   Eliquis  . Depression   . Diverticulosis 2008  . Fracture of scaphoid bone of hand 08/05/2004  . History of UTI   . Hyperlipidemia   . Hypertension   . Hyponatremia    SIADH  . Low sodium levels   . Mild cognitive impairment   . Osteopenia   . Rosacea   . Squamous cell carcinoma   . Thyroid nodule    Past Surgical History:  Procedure Laterality Date  . ABDOMINAL HYSTERECTOMY  1970   TAH-BSO  . cataracts Bilateral 2/05 and 3/05  . CESAREAN SECTION     questionable    Allergies  Allergen Reactions  . Aricept [Donepezil]   . Solifenacin Other (See Comments)  . Ciprofloxacin Other (See Comments)    Reaction unknown  . Latex Other (  See Comments)    Reaction unknown  . Macrobid [Nitrofurantoin Macrocrystal] Nausea And Vomiting  . Macrodantin [Nitrofurantoin] Nausea Only    Upset stomach  . Other Other (See Comments)    Popcorn- Diverticulosis  . Strawberry Extract Other (See Comments)    Diverticulosis   . Tramadol Nausea Only    Outpatient Encounter Medications as of 01/14/2020  Medication Sig  . acetaminophen (TYLENOL) 325 MG tablet Take 650 mg by mouth every 6 (six) hours as needed.   Marland Kitchen apixaban (ELIQUIS) 2.5 MG TABS tablet Take 1 tablet (2.5 mg total) by mouth 2 (two)  times daily.  . Calcium Carbonate-Vitamin D (CALCIUM 600+D PO) Take by mouth daily.  . cholecalciferol (VITAMIN D) 1000 UNITS tablet Take 2,000 Units by mouth daily.  . magnesium hydroxide (MILK OF MAGNESIA) 400 MG/5ML suspension Take 20 mLs by mouth daily as needed for mild constipation.  . metoprolol succinate (TOPROL-XL) 25 MG 24 hr tablet Take 12.5 mg by mouth daily.  . mirtazapine (REMERON) 15 MG tablet Take 15 mg at bedtime by mouth.  . Multiple Vitamins-Minerals (MULTI COMPLETE/IRON) TABS Take 1 tablet by mouth daily. 18-400 mg-mcg  . ofloxacin (OCUFLOX) 0.3 % ophthalmic solution Place 1 drop into both eyes 4 (four) times daily.  . polyethylene glycol (MIRALAX / GLYCOLAX) packet 1/2 dose daily for constipation  . saccharomyces boulardii (FLORASTOR) 250 MG capsule Take 250 mg by mouth daily.  . sodium chloride 1 G tablet Take 1 g by mouth 2 (two) times daily with a meal.  . trimethoprim (TRIMPEX) 100 MG tablet Take 100 mg by mouth daily.  Marland Kitchen trimethoprim-polymyxin b (POLYTRIM) ophthalmic solution Place 1 drop into the left eye 4 (four) times daily.  Marland Kitchen UNABLE TO FIND Med Name: Preservative Free Tears every hour while awake in both eyes wait 3-5 min between drops. DO NOT wake resident for eye drops per MD   No facility-administered encounter medications on file as of 01/14/2020.    Review of Systems  Constitutional: Positive for activity change and fatigue. Negative for appetite change, chills, fever and unexpected weight change.  Eyes: Positive for visual disturbance.  Respiratory: Negative for chest tightness and shortness of breath.   Cardiovascular: Positive for leg swelling. Negative for chest pain and palpitations.  Genitourinary: Negative for dysuria.  Musculoskeletal: Positive for gait problem.  Neurological: Negative for dizziness.  Psychiatric/Behavioral: Positive for confusion. Negative for sleep disturbance.    Immunization History  Administered Date(s) Administered  .  H1N1 12/04/2008  . Influenza, High Dose Seasonal PF 09/19/2019  . Influenza,inj,Quad PF,6+ Mos 09/11/2018  . Influenza-Unspecified 09/15/2003, 11/01/2010, 09/12/2011, 07/22/2016, 09/11/2017  . Moderna SARS-COVID-2 Vaccination 11/26/2019, 12/24/2019  . Pneumococcal Conjugate-13 04/14/2017  . Pneumococcal Polysaccharide-23 12/19/2008  . Td 09/15/2003, 01/06/2016  . Zoster Recombinat (Shingrix) 08/21/2017, 11/21/2017   Pertinent  Health Maintenance Due  Topic Date Due  . INFLUENZA VACCINE  Completed  . DEXA SCAN  Completed  . PNA vac Low Risk Adult  Completed   Fall Risk  08/15/2018 07/12/2017 06/21/2017 05/04/2017 03/08/2017  Falls in the past year? No No No No No   Functional Status Survey:    Vitals:   01/14/20 1458  BP: (!) 90/46  Pulse: (!) 139  Temp: (!) 97.1 F (36.2 C)  SpO2: 99%  Weight: 116 lb (52.6 kg)  Height: 5\' 3"  (1.6 m)   Body mass index is 20.55 kg/m. Physical Exam Vitals and nursing note reviewed.  Constitutional:      General: She is not  in acute distress.    Appearance: She is not ill-appearing or toxic-appearing.  HENT:     Head: Normocephalic and atraumatic.  Eyes:     Comments: No longer looks at me directly; glasses  Cardiovascular:     Rate and Rhythm: Rhythm irregular.     Heart sounds: No murmur.  Pulmonary:     Effort: Pulmonary effort is normal.     Breath sounds: Normal breath sounds.  Musculoskeletal:        General: Normal range of motion.     Right lower leg: Edema present.     Left lower leg: Edema present.     Comments: Nonpitting edema of bilateral lower legs with varicose veins and brawny skin tone; skin tears as below  Skin:    Comments: Right LE laceration healing--eschar present; LLE skin tear top 3/4 of square is approximated but bottom quarter appears that skin was lost--actively bleeding when polymem removed  Neurological:     Mental Status: She is alert.     Labs reviewed: Recent Labs    02/18/19 0000  NA 141  K 3.9    BUN 20  CREATININE 1.1   No results for input(s): AST, ALT, ALKPHOS, BILITOT, PROT, ALBUMIN in the last 8760 hours. Recent Labs    02/18/19 0000 08/06/19 0000  WBC 6.2 4.8  HGB 12.1 12.2  HCT 35* 36  PLT 210 209   Lab Results  Component Value Date   TSH 2.29 10/18/2018    Assessment/Plan 1. Late onset Alzheimer's disease with behavioral disturbance ([place]) -progressing gradually -ability to see around her has worsened -continue memory care support  2. Pain in both lower extremities -chronic, stable -recommend distraction during wound care and use of tylenol prior if having discomfort  3.  Noninfected skin tear of left leg -continue to keep clean and dry and change polymem every three days and prn soiling -due to recurrence of these, use geri-sleeves on legs when compression hose removed at hs -is to get new wheelchair -is also going to get regular toenail trimming with podiatry (nails too thick to be trimmed with regular clippers) -sharp things in room are being covered with foam/cushioning of sorts  Family/ staff Communication: -discussed with her husband and with nurse in memory care  Labs/tests ordered:  No new  Manu Rubey L. Kierstin January, D.O. [facility] [address], [place] [address] Cell Phone (Mon-Fri 8am-5pm):  [phone] On Call:  [phone] & follow prompts after 5pm & weekends Office Phone:  [phone] Office Fax:  [phone]

## 2020-01-16 NOTE — Addendum Note (Signed)
Addended by: [name] on: 01/16/2020 12:42 PM   Modules accepted: Level of Service

## 2020-01-20 ENCOUNTER — Non-Acute Institutional Stay: Payer: Medicare Other | Admitting: Adult Health

## 2020-01-20 ENCOUNTER — Encounter: Payer: Self-pay | Admitting: Adult Health

## 2020-01-20 DIAGNOSIS — L03116 Cellulitis of left lower limb: Secondary | ICD-10-CM | POA: Diagnosis not present

## 2020-01-20 DIAGNOSIS — S41112A Laceration without foreign body of left upper arm, initial encounter: Secondary | ICD-10-CM

## 2020-01-20 DIAGNOSIS — B353 Tinea pedis: Secondary | ICD-10-CM | POA: Diagnosis not present

## 2020-01-20 NOTE — Progress Notes (Addendum)
Location:  [facility] of Service:  ALF (13) Provider:   [name], ANP [place] [phone]   [name], DO  Patient Care Team: [name], DO as PCP - General (Geriatric Medicine) [name], MD as Consulting Physician (Cardiology) Community, Well [name], MD as Consulting Physician (Gastroenterology) [name], [name], MD as Consulting Physician (Dermatology) [name], MD as Consulting Physician (Cardiology) [name], MD as Consulting Physician (Ophthalmology) Zimmer, [name], NP as Nurse Practitioner (Internal Medicine) [name], MD as Consulting Physician (Gynecology)  Extended Emergency Contact Information Primary Emergency Contact: [facility] [place] Address: [address]          [place], [place] [address] [name] of [place] Phone: [phone] Work Phone: [phone] Mobile Phone: [phone] Relation: Spouse  Code Status:  DNR Goals of care: Advanced Directive information Advanced Directives 01/14/2020  Does Patient Have a Medical Advance Directive? Yes  Type of Advance Directive Out of facility DNR (pink MOST or yellow form)  Does patient want to make changes to medical advance directive? No - Guardian declined  Copy of [place] in Chart? -  Would patient like information on creating a medical advance directive? -  Pre-existing out of facility DNR order (yellow form or pink MOST form) -     Chief Complaint  Patient presents with  . Acute Visit    eval skin tear for cellulitis    HPI:  Pt is a 86 y.o. female seen today for an acute visit for eval for swelling and redness and pain to a skin tear to the left lower ext. Nurse reports there is a skin tear to the anterior left leg that has the above complaints. Also purulent drainage is noted. No fever, chills, malaise, lack of appetite, nausea,  vomiting, or diarrhea.    Past Medical History:  Diagnosis Date  . Actinic keratoses   . Allergy   . Alzheimer disease ([place]) 05/21/2015  . Anxiety   . Arrhythmia    atrial fib  . Atrial fibrillation ([place]) 12/2012  . Basal cell carcinoma of skin   . Cataracts, both eyes 2005, 2013   Cataract extraction/IOL implant  . Current use of long term anticoagulation 12/2012   Eliquis  . Depression   . Diverticulosis 2008  . Fracture of scaphoid bone of hand 08/05/2004  . History of UTI   . Hyperlipidemia   . Hypertension   . Hyponatremia    SIADH  . Low sodium levels   . Mild cognitive impairment   . Osteopenia   . Rosacea   . Squamous cell carcinoma   . Thyroid nodule    Past Surgical History:  Procedure Laterality Date  . ABDOMINAL HYSTERECTOMY  1970   TAH-BSO  . cataracts Bilateral 2/05 and 3/05  . CESAREAN SECTION     questionable    Allergies  Allergen Reactions  . Aricept [Donepezil]   . Solifenacin Other (See Comments)  . Ciprofloxacin Other (See Comments)    Reaction unknown  . Latex Other (See Comments)    Reaction unknown  . Macrobid [Nitrofurantoin Macrocrystal] Nausea And Vomiting  . Macrodantin [Nitrofurantoin] Nausea Only    Upset stomach  . Other Other (See Comments)    Popcorn- Diverticulosis  . Strawberry Extract Other (See Comments)    Diverticulosis   . Tramadol Nausea Only    Outpatient Encounter Medications as of 01/20/2020  Medication Sig  .  acetaminophen (TYLENOL) 325 MG tablet Take 650 mg by mouth every 6 (six) hours as needed.   Marland Kitchen apixaban (ELIQUIS) 2.5 MG TABS tablet Take 1 tablet (2.5 mg total) by mouth 2 (two) times daily.  . Calcium Carbonate-Vitamin D (CALCIUM 600+D PO) Take by mouth daily.  . cholecalciferol (VITAMIN D) 1000 UNITS tablet Take 2,000 Units by mouth daily.  Marland Kitchen doxycycline (DORYX) 100 MG EC tablet Take 100 mg by mouth 2 (two) times daily. X 7 days for cellulitis to left leg  . magnesium hydroxide (MILK OF MAGNESIA) 400  MG/5ML suspension Take 20 mLs by mouth daily as needed for mild constipation.  . metoprolol succinate (TOPROL-XL) 25 MG 24 hr tablet Take 12.5 mg by mouth daily.  . mirtazapine (REMERON) 15 MG tablet Take 15 mg at bedtime by mouth.  . Multiple Vitamins-Minerals (MULTI COMPLETE/IRON) TABS Take 1 tablet by mouth daily. 18-400 mg-mcg  . polyethylene glycol (MIRALAX / GLYCOLAX) packet 1/2 dose daily for constipation  . saccharomyces boulardii (FLORASTOR) 250 MG capsule Take 250 mg by mouth daily.  . sodium chloride 1 G tablet Take 1 g by mouth 2 (two) times daily with a meal.  . terbinafine (LAMISIL) 1 % cream Apply 1 application topically 2 (two) times daily. X 2 weeks to feet  . trimethoprim (TRIMPEX) 100 MG tablet Take 100 mg by mouth daily.  Marland Kitchen trimethoprim-polymyxin b (POLYTRIM) ophthalmic solution Place 1 drop into the left eye 4 (four) times daily.  Marland Kitchen UNABLE TO FIND Med Name: Preservative Free Tears every hour while awake in both eyes wait 3-5 min between drops. DO NOT wake resident for eye drops per MD  . [DISCONTINUED] ofloxacin (OCUFLOX) 0.3 % ophthalmic solution Place 1 drop into both eyes 4 (four) times daily.   No facility-administered encounter medications on file as of 01/20/2020.    Review of Systems  Unable to perform ROS: Dementia    Immunization History  Administered Date(s) Administered  . H1N1 12/04/2008  . Influenza, High Dose Seasonal PF 09/19/2019  . Influenza,inj,Quad PF,6+ Mos 09/11/2018  . Influenza-Unspecified 09/15/2003, 11/01/2010, 09/12/2011, 07/22/2016, 09/11/2017  . Moderna SARS-COVID-2 Vaccination 11/26/2019, 12/24/2019  . Pneumococcal Conjugate-13 04/14/2017  . Pneumococcal Polysaccharide-23 12/19/2008  . Td 09/15/2003, 01/06/2016  . Zoster Recombinat (Shingrix) 08/21/2017, 11/21/2017   Pertinent  Health Maintenance Due  Topic Date Due  . INFLUENZA VACCINE  Completed  . DEXA SCAN  Completed  . PNA vac Low Risk Adult  Completed   Fall Risk  08/15/2018  07/12/2017 06/21/2017 05/04/2017 03/08/2017  Falls in the past year? No No No No No   Functional Status Survey:    Vitals:   01/20/20 1016  BP: (!) 117/51  Pulse: 63  Resp: 18  Temp: (!) 96.9 F (36.1 C)  SpO2: 99%   There is no height or weight on file to calculate BMI. Physical Exam Vitals and nursing note reviewed.  Constitutional:      General: She is not in acute distress.    Appearance: She is not diaphoretic.  HENT:     Head: Normocephalic and atraumatic.  Neck:     Vascular: No JVD.  Cardiovascular:     Rate and Rhythm: Normal rate and regular rhythm.     Pulses:          Dorsalis pedis pulses are 1+ on the right side and 1+ on the left side.     Heart sounds: No murmur.  Pulmonary:     Effort: Pulmonary effort is normal. No  respiratory distress.     Breath sounds: Normal breath sounds. No wheezing.  Musculoskeletal:     Right foot: Normal range of motion. No deformity.     Left foot: Normal range of motion. No deformity.  Feet:     Right foot:     Toenail Condition: Right toenails are abnormally thick and long.     Left foot:     Toenail Condition: Left toenails are abnormally thick and long.     Comments: Erythema and white drainage noted in b/w the toes of both feet Skin:    General: Skin is warm and dry.     Comments: Left anterior shin with skin tear with surrounding erythema, warmth, tenderness, and small amt of purulent drainage to dressing. No red streaking or adenopathy noted. Wound bed ecchymotic with pink tissue. No slough.  Neurological:     General: No focal deficit present.     Mental Status: She is alert. Mental status is at baseline.     Cranial Nerves: Cranial nerve deficit present.  Psychiatric:        Mood and Affect: Mood normal.     Labs reviewed: Recent Labs    02/18/19 0000  NA 141  K 3.9  BUN 20  CREATININE 1.1   No results for input(s): AST, ALT, ALKPHOS, BILITOT, PROT, ALBUMIN in the last 8760 hours. Recent Labs     02/18/19 0000 08/06/19 0000  WBC 6.2 4.8  HGB 12.1 12.2  HCT 35* 36  PLT 210 209   Lab Results  Component Value Date   TSH 2.29 10/18/2018   No results found for: HGBA1C No results found for: CHOL, HDL, LDLCALC, LDLDIRECT, TRIG, CHOLHDL  Significant Diagnostic Results in last 30 days:  No results found.  Assessment/Plan 1. Cellulitis of left lower extremity No signs of systemic illness Doxycycline 100 mg bid x 7 days with food   2. Skin tear of left upper extremity See above Cleanse with normal saline and apply polymem and change q 3 days Continue geri sleeves to prevent skin tears, remove WC stirrups to new chair, and trim toenails podiatry coming to facility 3/2 per nurse)  3. Tinea pedis of both feet Cleanse feet with warm soapy water soak 10-15 min. Dry thoroughly in between toes. Apply Lamisil cream bid x 2 weeks    Family/ staff Communication: discussed with nurse  Labs/tests ordered:  NA

## 2020-01-21 DIAGNOSIS — Q6689 Other  specified congenital deformities of feet: Secondary | ICD-10-CM | POA: Diagnosis not present

## 2020-01-21 DIAGNOSIS — B351 Tinea unguium: Secondary | ICD-10-CM | POA: Diagnosis not present

## 2020-01-28 ENCOUNTER — Non-Acute Institutional Stay: Payer: Medicare Other | Admitting: Internal Medicine

## 2020-01-28 ENCOUNTER — Encounter: Payer: Self-pay | Admitting: Internal Medicine

## 2020-01-28 DIAGNOSIS — L03116 Cellulitis of left lower limb: Secondary | ICD-10-CM

## 2020-01-28 DIAGNOSIS — S81812A Laceration without foreign body, left lower leg, initial encounter: Secondary | ICD-10-CM | POA: Diagnosis not present

## 2020-01-28 DIAGNOSIS — G301 Alzheimer's disease with late onset: Secondary | ICD-10-CM | POA: Diagnosis not present

## 2020-01-28 DIAGNOSIS — F02818 Dementia in other diseases classified elsewhere, unspecified severity, with other behavioral disturbance: Secondary | ICD-10-CM

## 2020-01-28 DIAGNOSIS — F0281 Dementia in other diseases classified elsewhere with behavioral disturbance: Secondary | ICD-10-CM

## 2020-01-28 DIAGNOSIS — R630 Anorexia: Secondary | ICD-10-CM

## 2020-01-28 DIAGNOSIS — I480 Paroxysmal atrial fibrillation: Secondary | ICD-10-CM

## 2020-01-28 DIAGNOSIS — Z7189 Other specified counseling: Secondary | ICD-10-CM

## 2020-01-28 NOTE — Progress Notes (Signed)
Patient ID: Cathy Dickerson, female   DOB: Apr 21, 1927, 86 y.o.   MRN: [id number]  Location:  [place] Room Number: [place]/ 637 Place of Service:  ALF [phone]) Provider:   [name], DO  Patient Care Team: Cathy Curry, DO as PCP - General (Geriatric Medicine) Cathy Reedy, MD as Consulting Physician (Cardiology) Community, [facility] Cathy Aas, MD as Consulting Physician (Gastroenterology) Dickerson, [name], MD as Consulting Physician (Dermatology) Cathy Reedy, MD as Consulting Physician (Cardiology) Cathy Mutter, MD as Consulting Physician (Ophthalmology) Cathy Dickerson, Cathy Edman, NP as Nurse Practitioner (Internal Medicine) [name], MD as Consulting Physician (Gynecology)  Extended Emergency Contact Information Primary Emergency Contact: Cathy Dickerson,Cathy Dickerson Address: [address]          [place], [place] [address] Cathy Dickerson of [place] Phone: [phone] Work Phone: [phone] Mobile Phone: [phone] Relation: Spouse  Code Status:  DNR Goals of care: Advanced Directive information Advanced Directives 01/28/2020  Does Patient Have a Medical Advance Directive? Yes  Type of Advance Directive Out of facility DNR (pink MOST or yellow form)  Does patient want to make changes to medical advance directive? No - Patient declined  Copy of [place] in Chart? -  Would patient like information on creating a medical advance directive? -  Pre-existing out of facility DNR order (yellow form or pink MOST form) Pink MOST/Yellow Form most recent copy in chart - Physician notified to receive inpatient order     Chief Complaint  Patient presents with  . Acute Visit    Care planning     HPI:  Pt is a 86 y.o. female with Alzheimer's disease living in memory care, htn, afib, osteopenia, visual hallucinations, depression, visual agnosia, constipation and urinary incontinence seen  today for an acute visit for care plan meeting due to decreasing po intake.  Nursing had noted that Cathy Dickerson was sleeping more, eating less at her meals and having more bad days where she did not want to get up and participate in activities and meals.  This was mentioned to her husband, Cathy Dickerson, who is very involved in her care.  Activities had been trying to make their visits via alexa and now live visits when she was more alert and could better participate so he may have been seeing her at better times.  Nursing concern was that if her intake remained poor, she may begin a weight loss trend.  A supplement was suggested and framed as a question of whether he wanted [place] to pursue the supplement because it could be considered a life prolonging measure.  This concerned her husband and he had called for a meeting of the group to be sure he was aware of what was going on with Cathy Dickerson.  Myself, Delories Heinz and [place] met with Cathy Dickerson to review Tavia's status.  Essentially, we reviewed the typical trajectory of someone with moderate to severe dementia including expected changes like her decreasing po intake and possible weight loss, dysphagia due to the disease, more challenges with mobility and less speech, more time resting in bed and stiffening of joints if not moved regularly.  He does want to pursue the nutritional supplements for her at this time because she does sometimes continue to choose to eat and drink well and he wants to give her the option of doing this.  He understands and expressed that if she were to stop taking them, lose weight into the lower 100s and have  difficulty swallowing, he'd like to rethink things for her.  He again reviewed her advance directives and she was very clear that she did not want life prolonging measures at end-of-life.    We also reviewed that her skin tears of her legs are healing gradually.  The one that was believed to be infected now has no signs of infection without erythema,  warmth or drainage and is slowly drying up.  Modifications have been made to her bed and she's gotten a new wheelchair to try to prevent these from recurring plus the gerisleeves are being used when her hose are not in place.  Of note, this morning, Cathy Dickerson was particularly lethargic which concerned her nurse. She did not even want to get out of bed to urinate or have a bm on the commode.  Her HR was in the lower 40s when it typically runs in the 50s to 60s, but it did improve as recorded and her alertness improved.  She's on the lowest dose of toprol xl.  We could not find a pattern of her pulse and her symptoms outside of this morning's events.  Past Medical History:  Diagnosis Date  . Actinic keratoses   . Allergy   . Alzheimer disease ([place]) 05/21/2015  . Anxiety   . Arrhythmia    atrial fib  . Atrial fibrillation ([place]) 12/2012  . Basal cell carcinoma of skin   . Cataracts, both eyes 2005, 2013   Cataract extraction/IOL implant  . Current use of long term anticoagulation 12/2012   Eliquis  . Depression   . Diverticulosis 2008  . Fracture of scaphoid bone of hand 08/05/2004  . History of UTI   . Hyperlipidemia   . Hypertension   . Hyponatremia    SIADH  . Low sodium levels   . Mild cognitive impairment   . Osteopenia   . Rosacea   . Squamous cell carcinoma   . Thyroid nodule    Past Surgical History:  Procedure Laterality Date  . ABDOMINAL HYSTERECTOMY  1970   TAH-BSO  . cataracts Bilateral 2/05 and 3/05  . CESAREAN SECTION     questionable    Allergies  Allergen Reactions  . Aricept [Donepezil]   . Solifenacin Other (See Comments)  . Ciprofloxacin Other (See Comments)    Reaction unknown  . Latex Other (See Comments)    Reaction unknown  . Macrobid [Nitrofurantoin Macrocrystal] Nausea And Vomiting  . Macrodantin [Nitrofurantoin] Nausea Only    Upset stomach  . Other Other (See Comments)    Popcorn- Diverticulosis  . Strawberry Extract Other (See Comments)     Diverticulosis   . Tramadol Nausea Only    Outpatient Encounter Medications as of 01/28/2020  Medication Sig  . acetaminophen (TYLENOL) 325 MG tablet Take 650 mg by mouth every 6 (six) hours as needed.   Marland Kitchen apixaban (ELIQUIS) 2.5 MG TABS tablet Take 1 tablet (2.5 mg total) by mouth 2 (two) times daily.  . Calcium Carbonate-Vitamin D (CALCIUM 600+D PO) Take by mouth daily.  . cholecalciferol (VITAMIN D) 1000 UNITS tablet Take 2,000 Units by mouth daily.  Marland Kitchen doxycycline (DORYX) 100 MG EC tablet Take 100 mg by mouth 2 (two) times daily. X 7 days for cellulitis to left leg  . magnesium hydroxide (MILK OF MAGNESIA) 400 MG/5ML suspension Take 20 mLs by mouth daily as needed for mild constipation.  . metoprolol succinate (TOPROL-XL) 25 MG 24 hr tablet Take 12.5 mg by mouth daily.  . mirtazapine (REMERON) 15  MG tablet Take 15 mg at bedtime by mouth.  . Multiple Vitamins-Minerals (MULTI COMPLETE/IRON) TABS Take 1 tablet by mouth daily. 18-400 mg-mcg  . polyethylene glycol (MIRALAX / GLYCOLAX) packet 1/2 dose daily for constipation  . saccharomyces boulardii (FLORASTOR) 250 MG capsule Take 250 mg by mouth daily.  . sodium chloride 1 G tablet Take 1 g by mouth 2 (two) times daily with a meal.  . terbinafine (LAMISIL) 1 % cream Apply 1 application topically 2 (two) times daily. X 2 weeks to feet  . trimethoprim (TRIMPEX) 100 MG tablet Take 100 mg by mouth daily.  Marland Kitchen trimethoprim-polymyxin b (POLYTRIM) ophthalmic solution Place 1 drop into the left eye 4 (four) times daily.  Marland Kitchen UNABLE TO FIND Med Name: Preservative Free Tears every hour while awake in both eyes wait 3-5 min between drops. DO NOT wake resident for eye drops per MD  . venlafaxine (EFFEXOR) 75 MG tablet Take 75 mg by mouth 2 (two) times daily.   No facility-administered encounter medications on file as of 01/28/2020.    Review of Systems  Constitutional: Positive for activity change, appetite change and fatigue. Negative for chills, fever and  unexpected weight change.  HENT: Negative for congestion, sore throat and trouble swallowing.   Respiratory: Negative for chest tightness and shortness of breath.   Cardiovascular: Negative for chest pain, palpitations and leg swelling.  Gastrointestinal: Negative for abdominal pain and constipation.  Genitourinary: Negative for dysuria.  Musculoskeletal: Positive for gait problem.  Skin:       Skin tears of legs improving--no signs of infection remain  Neurological: Negative for dizziness and weakness.  Psychiatric/Behavioral: Positive for confusion. Negative for sleep disturbance. The patient is not nervous/anxious.        Gets upset when hair is combed, legs are touched during care (was not doing that earlier which also was concerning, but she was back to herself when I saw her)    Immunization History  Administered Date(s) Administered  . H1N1 12/04/2008  . Influenza, High Dose Seasonal PF 09/19/2019  . Influenza,inj,Quad PF,6+ Mos 09/11/2018  . Influenza-Unspecified 09/15/2003, 11/01/2010, 09/12/2011, 07/22/2016, 09/11/2017  . Moderna SARS-COVID-2 Vaccination 11/26/2019, 12/24/2019  . Pneumococcal Conjugate-13 04/14/2017  . Pneumococcal Polysaccharide-23 12/19/2008  . Td 09/15/2003, 01/06/2016  . Zoster Recombinat (Shingrix) 08/21/2017, 11/21/2017   Pertinent  Health Maintenance Due  Topic Date Due  . INFLUENZA VACCINE  Completed  . DEXA SCAN  Completed  . PNA vac Low Risk Adult  Completed   Fall Risk  08/15/2018 07/12/2017 06/21/2017 05/04/2017 03/08/2017  Falls in the past year? _0    Functional Status Survey:    Vitals:   01/28/20 1138  BP: (!) 158/61  Pulse: (!) 53  Temp: (!) 97.3 F (36.3 C)  SpO2: 98%  Weight: 120 lb 3.4 oz (54.5 kg)  Height: _1  (1.6 m)   Body mass index is 21.29 kg/m. Physical Exam Vitals reviewed.  Constitutional:      General: She is not in acute distress.    Appearance: She is not ill-appearing or toxic-appearing.  HENT:       Head: Normocephalic and atraumatic.  Cardiovascular:     Rate and Rhythm: Bradycardia present. Rhythm irregular.     Heart sounds: No murmur.  Pulmonary:     Effort: Pulmonary effort is normal.     Breath sounds: Normal breath sounds. No wheezing, rhonchi or rales.  Abdominal:     General: Bowel sounds are normal.  Palpations: Abdomen is soft.  Musculoskeletal:        General: Normal range of motion.     Right lower leg: No edema.     Left lower leg: No edema.  Skin:    Comments: Two skin tears healing--one that was infected no longer has erythema, warmth, drainage and is scabbing over  Neurological:     General: No focal deficit present.     Mental Status: She is alert. Mental status is at baseline.     Gait: Gait abnormal.  Psychiatric:        Mood and Affect: Mood normal.     Labs reviewed: Recent Labs    02/18/19 0000  NA 141  K 3.9  BUN 20  CREATININE 1.1   No results for input(s): AST, ALT, ALKPHOS, BILITOT, PROT, ALBUMIN in the last 8760 hours. Recent Labs    02/18/19 0000 08/06/19 0000  WBC 6.2 4.8  HGB 12.1 12.2  HCT 35* 36  PLT 210 209   Lab Results  Component Value Date   TSH 2.29 10/18/2018   No results found for: HGBA1C No results found for: CHOL, HDL, LDLCALC, LDLDIRECT, TRIG, CHOLHDL   Assessment/Plan 1. Late onset Alzheimer's disease with behavioral disturbance ([place]) -gradually progressing and getting more bad days of staying in bed with less intake, but not showing significant weight loss just yet--does snack quite a bit -will implement supplements as recommended by dietitian and continue this until Golden does not desire them or has more challenges with swallowing, is resistant to po intake  2. Cellulitis of left lower extremity -appears resolved with abx (apparently developed a week after the skin tear developed)  3. Noninfected skin tear of left lower extremity, initial encounter -is healing gradually with polymem use and after  tx of cellulitis -measures in place as above to prevent recurrence as best we can with her sensitive skin   4. Decreased appetite -as part of her progressive dementia--educated her husband on these changes  -agrees to supplements at this time orally -no feeding tubes in future per her living will  5. Paroxysmal atrial fibrillation (HCC) -rate is variable, she is on beta blocker low dose toprol 12.5 daily  -had unusual low this am in 40s that correlated with lethargy so will monitor this though determining next steps with med mgt may be challenging if we cannot maintain the beta blocker (tachy-brady suspect)  6. ACP (advance care planning) -30 mins spent with nursing and social work meeting with her husband discussing her future care and reviewing advance directives in place  Family/ staff Communication: met with Cathy Dickerson for 30 minutes today  Labs/tests ordered:  No new  Cathy Dickerson, D.O. [facility] [address], [place] [address] Cell Phone (Mon-Fri 8am-5pm):  [phone] On Call:  [phone] & follow prompts after 5pm & weekends Office Phone:  [phone] Office Fax:  [phone]

## 2020-03-04 DIAGNOSIS — Z20828 Contact with and (suspected) exposure to other viral communicable diseases: Secondary | ICD-10-CM | POA: Diagnosis not present

## 2020-03-04 DIAGNOSIS — Z9189 Other specified personal risk factors, not elsewhere classified: Secondary | ICD-10-CM | POA: Diagnosis not present

## 2020-03-10 ENCOUNTER — Non-Acute Institutional Stay: Payer: Medicare Other | Admitting: Internal Medicine

## 2020-03-10 ENCOUNTER — Encounter: Payer: Self-pay | Admitting: Internal Medicine

## 2020-03-10 DIAGNOSIS — G301 Alzheimer's disease with late onset: Secondary | ICD-10-CM | POA: Diagnosis not present

## 2020-03-10 DIAGNOSIS — I10 Essential (primary) hypertension: Secondary | ICD-10-CM | POA: Diagnosis not present

## 2020-03-10 DIAGNOSIS — I480 Paroxysmal atrial fibrillation: Secondary | ICD-10-CM | POA: Diagnosis not present

## 2020-03-10 DIAGNOSIS — K5901 Slow transit constipation: Secondary | ICD-10-CM

## 2020-03-10 DIAGNOSIS — S8011XA Contusion of right lower leg, initial encounter: Secondary | ICD-10-CM | POA: Diagnosis not present

## 2020-03-10 DIAGNOSIS — F0281 Dementia in other diseases classified elsewhere with behavioral disturbance: Secondary | ICD-10-CM

## 2020-03-10 DIAGNOSIS — D692 Other nonthrombocytopenic purpura: Secondary | ICD-10-CM

## 2020-03-10 DIAGNOSIS — F02818 Dementia in other diseases classified elsewhere, unspecified severity, with other behavioral disturbance: Secondary | ICD-10-CM

## 2020-03-10 NOTE — Progress Notes (Signed)
Patient ID: Cathy Dickerson, female   DOB: [birth year], 86 y.o.   MRN: [id number]  Location:  [place] Room Number: [employer] 308 Place of Service:  ALF (13) Provider:  Khloi Rawl L. Mariea Clonts, D.O., C.M.D.  [name], DO  Patient Care Team: [name], DO as PCP - General (Geriatric Medicine) [name], MD as Consulting Physician (Cardiology) Community, [facility] [name], MD as Consulting Physician (Gastroenterology) [name], [name], MD as Consulting Physician (Dermatology) [name], MD as Consulting Physician (Cardiology) [name], MD as Consulting Physician (Ophthalmology) Zimmer, [name], NP as Nurse Practitioner (Internal Medicine) [name], MD as Consulting Physician (Gynecology)  Extended Emergency Contact Information Primary Emergency Contact: [facility] Address: [address]          [place], [place] [address] [name] of [place] Phone: [phone] Work Phone: [phone] Mobile Phone: [phone] Relation: Spouse  Code Status:  DNR Goals of care: Advanced Directive information Advanced Directives 03/10/2020  Does Patient Have a Medical Advance Directive? Yes  Type of Advance Directive Out of facility DNR (pink MOST or yellow form)  Does patient want to make changes to medical advance directive? No - Patient declined  Copy of [place] in Chart? -  Would patient like information on creating a medical advance directive? -  Pre-existing out of facility DNR order (yellow form or pink MOST form) -     Chief Complaint  Patient presents with  . Medical Management of Chronic Issues    Routine Visit     HPI:  Pt is a 86 y.o. female seen today for medical management of chronic diseases.  She lives in memory care [facility] for long-term care due to moderate dementia--she is still able to feed herself with cues and assistance and participate  some in her ADLs.  She spends a lot of time in bed and under a lot of covers to stay warm.    [name] was seen in the afternoon today and was resting in bed.  She was not very interested in my visiting her.  She did permit an exam.  She denied pain until I examined her legs and then just moving her socks down to view the hematoma on her right lower shin was painful for her.  This is at risk of rupturing and creating another open wound on her leg.  She requires very little trauma to cause these with her thin skin at 89 and her eliquis for her afib.  She keeps mild edema of the legs.  Staff do not report any changes with her in the past few days otherwise.     Past Medical History:  Diagnosis Date  . Actinic keratoses   . Allergy   . Alzheimer disease ([place]) 05/21/2015  . Anxiety   . Arrhythmia    atrial fib  . Atrial fibrillation ([place]) 12/2012  . Basal cell carcinoma of skin   . Cataracts, both eyes 2005, 2013   Cataract extraction/IOL implant  . Current use of long term anticoagulation 12/2012   Eliquis  . Depression   . Diverticulosis 2008  . Fracture of scaphoid bone of hand 08/05/2004  . History of UTI   . Hyperlipidemia   . Hypertension   . Hyponatremia    SIADH  . Low sodium levels   . Mild cognitive impairment   . Osteopenia   . Rosacea   . Squamous cell carcinoma   . Thyroid  nodule    Past Surgical History:  Procedure Laterality Date  . ABDOMINAL HYSTERECTOMY  1970   TAH-BSO  . cataracts Bilateral 2/05 and 3/05  . CESAREAN SECTION     questionable    Allergies  Allergen Reactions  . Aricept [Donepezil]   . Solifenacin Other (See Comments)  . Ciprofloxacin Other (See Comments)    Reaction unknown  . Latex Other (See Comments)    Reaction unknown  . Macrobid [Nitrofurantoin Macrocrystal] Nausea And Vomiting  . Macrodantin [Nitrofurantoin] Nausea Only    Upset stomach  . Other Other (See Comments)    Popcorn- Diverticulosis  . Strawberry Extract Other (See  Comments)    Diverticulosis   . Tramadol Nausea Only    Outpatient Encounter Medications as of 03/10/2020  Medication Sig  . acetaminophen (TYLENOL) 325 MG tablet Take 650 mg by mouth every 6 (six) hours as needed.   Marland Kitchen apixaban (ELIQUIS) 2.5 MG TABS tablet Take 1 tablet (2.5 mg total) by mouth 2 (two) times daily.  . Calcium Carbonate-Vitamin D (CALCIUM 600+D PO) Take by mouth daily.  . cholecalciferol (VITAMIN D) 1000 UNITS tablet Take 2,000 Units by mouth daily.  . magnesium hydroxide (MILK OF MAGNESIA) 400 MG/5ML suspension Take 20 mLs by mouth daily as needed for mild constipation.  . metoprolol succinate (TOPROL-XL) 25 MG 24 hr tablet Take 12.5 mg by mouth daily.  . mirtazapine (REMERON) 15 MG tablet Take 15 mg at bedtime by mouth.  . Multiple Vitamins-Minerals (MULTI COMPLETE/IRON) TABS Take 1 tablet by mouth daily. 18-400 mg-mcg  . polyethylene glycol (MIRALAX / GLYCOLAX) packet 1/2 dose daily for constipation  . saccharomyces boulardii (FLORASTOR) 250 MG capsule Take 250 mg by mouth daily.  . sodium chloride 1 G tablet Take 1 g by mouth 2 (two) times daily with a meal.  . terbinafine (LAMISIL) 1 % cream Apply 1 application topically 2 (two) times daily. X 2 weeks to feet  . trimethoprim (TRIMPEX) 100 MG tablet Take 100 mg by mouth daily.  Marland Kitchen trimethoprim-polymyxin b (POLYTRIM) ophthalmic solution Place 1 drop into the left eye 4 (four) times daily.  Marland Kitchen UNABLE TO FIND Med Name: Preservative Free Tears every hour while awake in both eyes wait 3-5 min between drops. DO NOT wake resident for eye drops per MD  . [DISCONTINUED] doxycycline (DORYX) 100 MG EC tablet Take 100 mg by mouth 2 (two) times daily. X 7 days for cellulitis to left leg  . [DISCONTINUED] venlafaxine (EFFEXOR) 75 MG tablet Take 75 mg by mouth 2 (two) times daily.   No facility-administered encounter medications on file as of 03/10/2020.    Review of Systems  Constitutional: Negative for chills and fever.  Eyes:        Wears contacts that require regular changing with Dr. Ellie Lunch  Respiratory: Negative for shortness of breath.   Cardiovascular: Negative for chest pain.  Gastrointestinal: Negative for abdominal pain and constipation.  Genitourinary: Negative for dysuria.  Musculoskeletal: Negative for falls and joint pain.  Skin:       New hematoma right lower shin  Neurological: Negative for dizziness and loss of consciousness.  Endo/Heme/Allergies: Bruises/bleeds easily.  Psychiatric/Behavioral: Positive for memory loss. Negative for depression. The patient is not nervous/anxious and does not have insomnia.     Immunization History  Administered Date(s) Administered  . H1N1 12/04/2008  . Influenza, High Dose Seasonal PF 09/19/2019  . Influenza,inj,Quad PF,6+ Mos 09/11/2018  . Influenza-Unspecified 09/15/2003, 11/01/2010, 09/12/2011, 07/22/2016, 09/11/2017  . Moderna SARS-COVID-2  Vaccination 11/26/2019, 12/24/2019  . Pneumococcal Conjugate-13 04/14/2017  . Pneumococcal Polysaccharide-23 12/19/2008  . Td 09/15/2003, 01/06/2016  . Zoster Recombinat (Shingrix) 08/21/2017, 11/21/2017   Pertinent  Health Maintenance Due  Topic Date Due  . INFLUENZA VACCINE  06/21/2020  . DEXA SCAN  Completed  . PNA vac Low Risk Adult  Completed   Fall Risk  08/15/2018 07/12/2017 06/21/2017 05/04/2017 03/08/2017  Falls in the past year? No No No No No   Functional Status Survey:    Vitals:   03/10/20 1040  BP: (!) 132/55  Pulse: (!) 53  Temp: (!) 97.4 F (36.3 C)  Weight: 120 lb 3.2 oz (54.5 kg)  Height: 5\' 3"  (1.6 m)   Body mass index is 21.29 kg/m. Physical Exam Constitutional:      General: She is not in acute distress.    Appearance: Normal appearance. She is not toxic-appearing.  HENT:     Head: Normocephalic and atraumatic.  Cardiovascular:     Rate and Rhythm: Rhythm irregular.     Heart sounds: No murmur.  Pulmonary:     Effort: Pulmonary effort is normal.     Breath sounds: Normal breath  sounds. No rales.  Abdominal:     General: Bowel sounds are normal.     Palpations: Abdomen is soft.  Musculoskeletal:        General: Normal range of motion.     Right lower leg: Edema present.     Left lower leg: Edema present.  Skin:    General: Skin is warm and dry.     Comments: Half dollar-sized hematoma on right distal anterior shin just beneath sock  Neurological:     General: No focal deficit present.     Mental Status: She is alert.     Cranial Nerves: No cranial nerve deficit.     Gait: Gait abnormal.     Comments: Uses manual wheelchair for mobility  Psychiatric:        Mood and Affect: Mood normal.     Labs reviewed: No results for input(s): NA, K, CL, CO2, GLUCOSE, BUN, CREATININE, CALCIUM, MG, PHOS in the last 8760 hours. No results for input(s): AST, ALT, ALKPHOS, BILITOT, PROT, ALBUMIN in the last 8760 hours. Recent Labs    08/06/19 0000  WBC 4.8  HGB 12.2  HCT 36  PLT 209   Lab Results  Component Value Date   TSH 2.29 10/18/2018   No results found for: HGBA1C No results found for: CHOL, HDL, LDLCALC, LDLDIRECT, TRIG, CHOLHDL  Significant Diagnostic Results in last 30 days:  No results found.  Assessment/Plan 1. Leg hematoma, right, initial encounter -avoid putting pressure on this area as best possible by being very gentle with her legs -prone to recurrence and rupture with anticoagulation therapy  2. Late onset Alzheimer's disease with behavioral disturbance ([place]) -moderate to severe--spending more time in bed and interacting less with staff, does not eat well either -had care plan meeting not long ago with Trip -cont supplement shakes for her  3. Paroxysmal atrial fibrillation (HCC) -rate controlled, continues on eliquis for hypercoagulable state due to afib  4. Essential hypertension -bp well controlled on current regimen, cont same and monitor  5. Slow transit constipation -no recent challenges reported with miralax 1/2 dose daily,  florastor probiotic and daily prn MOM  6. Senile purpura ([place]) -remains a longstanding concern with her eliquis, advanced age and thin skin  Family/ staff Communication: discussed with memory care nurse  Labs/tests ordered:  No new   Krystian Ferrentino L. Rakeb Kibble, D.O. [facility] [address], [place] [address] Cell Phone (Mon-Fri 8am-5pm):  [phone] On Call:  [phone] & follow prompts after 5pm & weekends Office Phone:  [phone] Office Fax:  [phone]

## 2020-03-17 ENCOUNTER — Encounter: Payer: Self-pay | Admitting: Internal Medicine

## 2020-03-17 DIAGNOSIS — D692 Other nonthrombocytopenic purpura: Secondary | ICD-10-CM | POA: Insufficient documentation

## 2020-03-31 DIAGNOSIS — M545 Low back pain: Secondary | ICD-10-CM | POA: Diagnosis not present

## 2020-03-31 DIAGNOSIS — M546 Pain in thoracic spine: Secondary | ICD-10-CM | POA: Diagnosis not present

## 2020-04-09 ENCOUNTER — Encounter: Payer: Self-pay | Admitting: Adult Health

## 2020-04-09 ENCOUNTER — Non-Acute Institutional Stay: Payer: Medicare Other | Admitting: Adult Health

## 2020-04-09 DIAGNOSIS — R238 Other skin changes: Secondary | ICD-10-CM | POA: Diagnosis not present

## 2020-04-09 DIAGNOSIS — S81811A Laceration without foreign body, right lower leg, initial encounter: Secondary | ICD-10-CM

## 2020-04-09 NOTE — Progress Notes (Signed)
Location:  Occupational psychologist of Service:  ALF (13) Provider:   [name], ANP [place] [phone]   Cathy Curry, DO  Patient Care Team: Cathy Curry, DO as PCP - General (Geriatric Medicine) Community, Well [name], MD as Consulting Physician (Gastroenterology) [name], [name], MD as Consulting Physician (Dermatology) [name], MD as Consulting Physician (Cardiology) [name], MD as Consulting Physician (Ophthalmology) Zimmer, [name], NP as Nurse Practitioner (Internal Medicine) [name], MD as Consulting Physician (Gynecology)  Extended Emergency Contact Information Primary Emergency Contact: [facility] Address: [address]          [place], [place] [address] [name] of [place] Phone: [phone] Work Phone: [phone] Mobile Phone: [phone] Relation: Spouse  Code Status:  DNR Goals of care: Advanced Directive information Advanced Directives 03/10/2020  Does Patient Have a Medical Advance Directive? Yes  Type of Advance Directive Out of facility DNR (pink MOST or yellow form)  Does patient want to make changes to medical advance directive? No - Patient declined  Copy of [place] in Chart? -  Would patient like information on creating a medical advance directive? -  Pre-existing out of facility DNR order (yellow form or pink MOST form) -     Chief Complaint  Patient presents with  . Acute Visit    skin tear    HPI:  Pt is a 86 y.o. female seen today for an acute visit for a skin tear. [name] has progressive dementia and resides in the memory care setting. Her nurse reports that she has a skin tear to her right lower extremity that she would like me to assess. There is no pain to the area. No fever reported. A moderate amt of drainage is reported of concern.    Past Medical History:  Diagnosis Date   . Actinic keratoses   . Allergy   . Alzheimer disease ([place]) 05/21/2015  . Anxiety   . Arrhythmia    atrial fib  . Atrial fibrillation ([place]) 12/2012  . Basal cell carcinoma of skin   . Cataracts, both eyes 2005, 2013   Cataract extraction/IOL implant  . Current use of long term anticoagulation 12/2012   Eliquis  . Depression   . Diverticulosis 2008  . Fracture of scaphoid bone of hand 08/05/2004  . History of UTI   . Hyperlipidemia   . Hypertension   . Hyponatremia    SIADH  . Low sodium levels   . Mild cognitive impairment   . Osteopenia   . Rosacea   . Squamous cell carcinoma   . Thyroid nodule    Past Surgical History:  Procedure Laterality Date  . ABDOMINAL HYSTERECTOMY  1970   TAH-BSO  . cataracts Bilateral 2/05 and 3/05  . CESAREAN SECTION     questionable    Allergies  Allergen Reactions  . Aricept [Donepezil]   . Solifenacin Other (See Comments)  . Ciprofloxacin Other (See Comments)    Reaction unknown  . Latex Other (See Comments)    Reaction unknown  . Macrobid [Nitrofurantoin Macrocrystal] Nausea And Vomiting  . Macrodantin [Nitrofurantoin] Nausea Only    Upset stomach  . Other Other (See Comments)    Popcorn- Diverticulosis  . Strawberry Extract Other (See Comments)    Diverticulosis   . Tramadol Nausea Only    Outpatient Encounter Medications as of 04/09/2020  Medication Sig  . acetaminophen (TYLENOL) 325 MG tablet  Take 650 mg by mouth every 6 (six) hours as needed.   Marland Kitchen apixaban (ELIQUIS) 2.5 MG TABS tablet Take 1 tablet (2.5 mg total) by mouth 2 (two) times daily.  . Calcium Carbonate-Vitamin D (CALCIUM 600+D PO) Take by mouth daily.  . cholecalciferol (VITAMIN D) 1000 UNITS tablet Take 2,000 Units by mouth daily.  . magnesium hydroxide (MILK OF MAGNESIA) 400 MG/5ML suspension Take 20 mLs by mouth daily as needed for mild constipation.  . metoprolol succinate (TOPROL-XL) 25 MG 24 hr tablet Take 12.5 mg by mouth daily.  . mirtazapine (REMERON)  15 MG tablet Take 15 mg at bedtime by mouth.  . Multiple Vitamins-Minerals (MULTI COMPLETE/IRON) TABS Take 1 tablet by mouth daily. 18-400 mg-mcg  . polyethylene glycol (MIRALAX / GLYCOLAX) packet 1/2 dose daily for constipation  . saccharomyces boulardii (FLORASTOR) 250 MG capsule Take 250 mg by mouth daily.  . sodium chloride 1 G tablet Take 1 g by mouth 2 (two) times daily with a meal.  . terbinafine (LAMISIL) 1 % cream Apply 1 application topically 2 (two) times daily. X 2 weeks to feet  . trimethoprim (TRIMPEX) 100 MG tablet Take 100 mg by mouth daily.  Marland Kitchen trimethoprim-polymyxin b (POLYTRIM) ophthalmic solution Place 1 drop into the left eye 4 (four) times daily.  Marland Kitchen UNABLE TO FIND Med Name: Preservative Free Tears every hour while awake in both eyes wait 3-5 min between drops. DO NOT wake resident for eye drops per MD   No facility-administered encounter medications on file as of 04/09/2020.    Review of Systems  Unable to perform ROS: Dementia    Immunization History  Administered Date(s) Administered  . H1N1 12/04/2008  . Influenza, High Dose Seasonal PF 09/19/2019  . Influenza,inj,Quad PF,6+ Mos 09/11/2018  . Influenza-Unspecified 09/15/2003, 11/01/2010, 09/12/2011, 07/22/2016, 09/11/2017  . Moderna SARS-COVID-2 Vaccination 11/26/2019, 12/24/2019  . Pneumococcal Conjugate-13 04/14/2017  . Pneumococcal Polysaccharide-23 12/19/2008  . Td 09/15/2003, 01/06/2016  . Zoster Recombinat (Shingrix) 08/21/2017, 11/21/2017   Pertinent  Health Maintenance Due  Topic Date Due  . INFLUENZA VACCINE  06/21/2020  . DEXA SCAN  Completed  . PNA vac Low Risk Adult  Completed   Fall Risk  08/15/2018 07/12/2017 06/21/2017 05/04/2017 03/08/2017  Falls in the past year? No No No No No   Functional Status Survey:    There were no vitals filed for this visit. There is no height or weight on file to calculate BMI. Physical Exam Musculoskeletal:     Comments: Trace BLE edema  Skin:    General:  Skin is warm and dry.     Coloration: Skin is not jaundiced or pale.     Findings: No bruising, erythema or rash.     Comments: Right anterior shin skin tear with 50% pink and 50% purple/blue color. Moderate amt of serosang drainage on the dressing. Mild surrounding erythema is present with maceration. The area is not warm, tender, or swollen.   Neurological:     Mental Status: She is alert.     Labs reviewed: No results for input(s): NA, K, CL, CO2, GLUCOSE, BUN, CREATININE, CALCIUM, MG, PHOS in the last 8760 hours. No results for input(s): AST, ALT, ALKPHOS, BILITOT, PROT, ALBUMIN in the last 8760 hours. Recent Labs    08/06/19 0000  WBC 4.8  HGB 12.2  HCT 36  PLT 209   Lab Results  Component Value Date   TSH 2.29 10/18/2018   No results found for: HGBA1C No results found for: CHOL,  HDL, LDLCALC, LDLDIRECT, TRIG, CHOLHDL  Significant Diagnostic Results in last 30 days:  No results found.  Assessment/Plan 1. Skin tear of lower leg without complication, right, initial encounter The wound is macerated but there are no signs of infection at this time. D/C polymem and begin more absorbent dressing with silver properties such as Sorbalgon   2. Age-related skin fragility Discussed with the nurse continued strategies to protect her skin such as geri sleeves and avoidance of WC stirrups    Family/ staff Communication: nurse  Labs/tests ordered:  NA

## 2020-04-13 DIAGNOSIS — R296 Repeated falls: Secondary | ICD-10-CM | POA: Diagnosis not present

## 2020-04-13 DIAGNOSIS — F0281 Dementia in other diseases classified elsewhere with behavioral disturbance: Secondary | ICD-10-CM | POA: Diagnosis not present

## 2020-04-13 DIAGNOSIS — R278 Other lack of coordination: Secondary | ICD-10-CM | POA: Diagnosis not present

## 2020-04-13 DIAGNOSIS — R2689 Other abnormalities of gait and mobility: Secondary | ICD-10-CM | POA: Diagnosis not present

## 2020-04-17 DIAGNOSIS — H16123 Filamentary keratitis, bilateral: Secondary | ICD-10-CM | POA: Diagnosis not present

## 2020-05-07 ENCOUNTER — Non-Acute Institutional Stay: Payer: Medicare Other | Admitting: Adult Health

## 2020-05-07 ENCOUNTER — Encounter: Payer: Self-pay | Admitting: Adult Health

## 2020-05-07 DIAGNOSIS — E871 Hypo-osmolality and hyponatremia: Secondary | ICD-10-CM

## 2020-05-07 DIAGNOSIS — Z87440 Personal history of urinary (tract) infections: Secondary | ICD-10-CM

## 2020-05-07 DIAGNOSIS — I480 Paroxysmal atrial fibrillation: Secondary | ICD-10-CM | POA: Diagnosis not present

## 2020-05-07 DIAGNOSIS — G301 Alzheimer's disease with late onset: Secondary | ICD-10-CM | POA: Diagnosis not present

## 2020-05-07 DIAGNOSIS — Z7901 Long term (current) use of anticoagulants: Secondary | ICD-10-CM

## 2020-05-07 DIAGNOSIS — F0281 Dementia in other diseases classified elsewhere with behavioral disturbance: Secondary | ICD-10-CM

## 2020-05-08 NOTE — Addendum Note (Signed)
Addended by: Barnie Mort on: 05/08/2020 09:22 AM   Modules accepted: Orders

## 2020-05-08 NOTE — Progress Notes (Addendum)
Location:  Occupational psychologist of Service:  ALF (13) Provider:  [name], ANP [place] [phone]   Cathy Curry, DO  Patient Care Team: Cathy Curry, DO as PCP - General (Geriatric Medicine) Community, Well Cathy Aas, MD as Consulting Physician (Gastroenterology) [name], [name], MD as Consulting Physician (Dermatology) [name], MD as Consulting Physician (Cardiology) [name], MD as Consulting Physician (Ophthalmology) Zimmer, [name], NP as Nurse Practitioner (Internal Medicine) [name], MD as Consulting Physician (Gynecology)  Extended Emergency Contact Information Primary Emergency Contact: [facility] Address: [address]          [place], [place] [address] Cathy Dickerson of [place] Phone: [phone] Work Phone: [phone] Mobile Phone: [phone] Relation: Spouse  Code Status:  DNR Goals of care: Advanced Directive information Advanced Directives 03/10/2020  Does Patient Have a Medical Advance Directive? Yes  Type of Advance Directive Out of facility DNR (pink MOST or yellow form)  Does patient want to make changes to medical advance directive? No - Patient declined  Copy of [place] in Chart? -  Would patient like information on creating a medical advance directive? -  Pre-existing out of facility DNR order (yellow form or pink MOST form) -     Chief Complaint  Patient presents with  . Medical Management of Chronic Issues    HPI:  Pt is a 86 y.o. female seen today for medical management of chronic diseases.   [name] has a hx of AD with progression over the past year. She continues to reside in the memory care unit but is no longer ambulatory and requires assistance with ADLs. She exhibits some resistance to personal care but can be redirected per interview with the nurse. There are no acute complaints  regarding her care today. Her weight is stable over the past few readings. Vitals are WNL. She has a hx of afib and is on Eliquis for CVA risk reduction, as well as Toprol for rate control. There are no issues with pnd, sob, doe, cp, etc.  Wt Readings from Last 3 Encounters:  05/08/20 121 lb 9.6 oz (55.2 kg)  03/10/20 120 lb 3.2 oz (54.5 kg)  01/28/20 120 lb 3.4 oz (54.5 kg)     Past Medical History:  Diagnosis Date  . Actinic keratoses   . Allergy   . Alzheimer disease ([place]) 05/21/2015  . Anxiety   . Arrhythmia    atrial fib  . Atrial fibrillation ([place]) 12/2012  . Basal cell carcinoma of skin   . Cataracts, both eyes 2005, 2013   Cataract extraction/IOL implant  . Current use of long term anticoagulation 12/2012   Eliquis  . Depression   . Diverticulosis 2008  . Fracture of scaphoid bone of hand 08/05/2004  . History of UTI   . Hyperlipidemia   . Hypertension   . Hyponatremia    SIADH  . Low sodium levels   . Mild cognitive impairment   . Osteopenia   . Rosacea   . Squamous cell carcinoma   . Thyroid nodule    Past Surgical History:  Procedure Laterality Date  . ABDOMINAL HYSTERECTOMY  1970   TAH-BSO  . cataracts Bilateral 2/05 and 3/05  . CESAREAN SECTION     questionable    Allergies  Allergen Reactions  . Aricept [Donepezil]   . Solifenacin Other (See Comments)  . Ciprofloxacin Other (See Comments)    Reaction unknown  .  Latex Other (See Comments)    Reaction unknown  . Macrobid [Nitrofurantoin Macrocrystal] Nausea And Vomiting  . Macrodantin [Nitrofurantoin] Nausea Only    Upset stomach  . Other Other (See Comments)    Popcorn- Diverticulosis  . Strawberry Extract Other (See Comments)    Diverticulosis   . Tramadol Nausea Only    Outpatient Encounter Medications as of 05/07/2020  Medication Sig  . Calcium Carbonate-Vitamin D (CALCIUM 600+D PO) Take by mouth daily.  . cholecalciferol (VITAMIN D) 1000 UNITS tablet Take 2,000 Units by mouth daily.  Marland Kitchen  tobramycin (TOBREX) 0.3 % ophthalmic solution Place 1 drop into the left eye in the morning, at noon, in the evening, and at bedtime.  Marland Kitchen acetaminophen (TYLENOL) 325 MG tablet Take 650 mg by mouth every 6 (six) hours as needed.   Marland Kitchen apixaban (ELIQUIS) 2.5 MG TABS tablet Take 1 tablet (2.5 mg total) by mouth 2 (two) times daily.  . magnesium hydroxide (MILK OF MAGNESIA) 400 MG/5ML suspension Take 20 mLs by mouth daily as needed for mild constipation.  . metoprolol succinate (TOPROL-XL) 25 MG 24 hr tablet Take 12.5 mg by mouth daily.  . mirtazapine (REMERON) 15 MG tablet Take 15 mg at bedtime by mouth.  . Multiple Vitamins-Minerals (MULTI COMPLETE/IRON) TABS Take 1 tablet by mouth daily. 18-400 mg-mcg  . polyethylene glycol (MIRALAX / GLYCOLAX) packet 1/2 dose daily for constipation  . saccharomyces boulardii (FLORASTOR) 250 MG capsule Take 250 mg by mouth daily.  . sodium chloride 1 G tablet Take 1 g by mouth 2 (two) times daily with a meal.  . trimethoprim (TRIMPEX) 100 MG tablet Take 100 mg by mouth daily.  Marland Kitchen UNABLE TO FIND Med Name: Preservative Free Tears every hour while awake in both eyes wait 3-5 min between drops. DO NOT wake resident for eye drops per MD  . [DISCONTINUED] terbinafine (LAMISIL) 1 % cream Apply 1 application topically 2 (two) times daily. X 2 weeks to feet  . [DISCONTINUED] trimethoprim-polymyxin b (POLYTRIM) ophthalmic solution Place 1 drop into the left eye 4 (four) times daily.   No facility-administered encounter medications on file as of 05/07/2020.    Review of Systems  Unable to perform ROS: Dementia    Immunization History  Administered Date(s) Administered  . H1N1 12/04/2008  . Influenza, High Dose Seasonal PF 09/19/2019  . Influenza,inj,Quad PF,6+ Mos 09/11/2018  . Influenza-Unspecified 09/15/2003, 11/01/2010, 09/12/2011, 07/22/2016, 09/11/2017  . Moderna SARS-COVID-2 Vaccination 11/26/2019, 12/24/2019  . Pneumococcal Conjugate-13 04/14/2017  . Pneumococcal  Polysaccharide-23 12/19/2008  . Td 09/15/2003, 01/06/2016  . Zoster Recombinat (Shingrix) 08/21/2017, 11/21/2017   Pertinent  Health Maintenance Due  Topic Date Due  . INFLUENZA VACCINE  06/21/2020  . DEXA SCAN  Completed  . PNA vac Low Risk Adult  Completed   Fall Risk  08/15/2018 07/12/2017 06/21/2017 05/04/2017 03/08/2017  Falls in the past year? No No No No No   Functional Status Survey:    Vitals:   05/08/20 0915  Weight: 121 lb 9.6 oz (55.2 kg)   Body mass index is 21.54 kg/m. Physical Exam Vitals and nursing note reviewed.  Constitutional:      General: She is not in acute distress.    Appearance: She is not diaphoretic.  HENT:     Head: Normocephalic and atraumatic.  Neck:     Vascular: No JVD.  Cardiovascular:     Rate and Rhythm: Normal rate. Rhythm irregular.     Heart sounds: No murmur heard.   Pulmonary:  Effort: Pulmonary effort is normal. No respiratory distress.     Breath sounds: Normal breath sounds. No wheezing.  Abdominal:     General: Bowel sounds are normal. There is no distension.     Palpations: Abdomen is soft.  Musculoskeletal:     Comments: Trace edema to BLE, healed skin tears noted to both lower ext.   Skin:    General: Skin is warm and dry.  Neurological:     General: No focal deficit present.     Mental Status: She is alert. Mental status is at baseline.  Psychiatric:        Mood and Affect: Mood normal.     Labs reviewed: No results for input(s): NA, K, CL, CO2, GLUCOSE, BUN, CREATININE, CALCIUM, MG, PHOS in the last 8760 hours. No results for input(s): AST, ALT, ALKPHOS, BILITOT, PROT, ALBUMIN in the last 8760 hours. Recent Labs    08/06/19 0000  WBC 4.8  HGB 12.2  HCT 36  PLT 209   Lab Results  Component Value Date   TSH 2.29 10/18/2018   No results found for: HGBA1C No results found for: CHOL, HDL, LDLCALC, LDLDIRECT, TRIG, CHOLHDL  Significant Diagnostic Results in last 30 days:  No results  found.  Assessment/Plan 1. Late onset Alzheimer's disease with behavioral disturbance ([place]) Progressive decline in cognition and physical function c/w the disease. Continue supportive care in the skilled environment.  2. Hyponatremia Continue sodium chloride 1 gram bid and check BMP annually unless resident has difficult with blood draws.   3. History of recurrent UTIs No new, continue trimethoprim 100 mg qd   4. Paroxysmal atrial fibrillation (HCC) Rate is controllled with Toprol 12.5 mg qd   5. Current use of long term anticoagulation Continue Eliquis for CVA risk reduction  Monitor CBC annually   Labs/tests ordered:  CBC BMP

## 2020-05-11 LAB — BASIC METABOLIC PANEL WITH GFR
BUN: 24 — AB (ref 4–21)
CO2: 22 (ref 13–22)
Chloride: 106 (ref 99–108)
Creatinine: 0.9 (ref 0.5–1.1)
Glucose: 80
Potassium: 4.5 (ref 3.4–5.3)
Sodium: 139 (ref 137–147)

## 2020-05-11 LAB — COMPREHENSIVE METABOLIC PANEL: Calcium: 9.7 (ref 8.7–10.7)

## 2020-05-11 LAB — CBC AND DIFFERENTIAL
HCT: 36 (ref 36–46)
Hemoglobin: 11.8 — AB (ref 12.0–16.0)
Platelets: 203 (ref 150–399)
WBC: 5.2

## 2020-05-11 LAB — CBC: RBC: 3.83 — AB (ref 3.87–5.11)

## 2020-05-27 ENCOUNTER — Encounter: Payer: Self-pay | Admitting: Internal Medicine

## 2020-07-02 ENCOUNTER — Non-Acute Institutional Stay: Payer: Medicare Other | Admitting: Adult Health

## 2020-07-02 DIAGNOSIS — F02818 Dementia in other diseases classified elsewhere, unspecified severity, with other behavioral disturbance: Secondary | ICD-10-CM

## 2020-07-02 DIAGNOSIS — G301 Alzheimer's disease with late onset: Secondary | ICD-10-CM

## 2020-07-02 DIAGNOSIS — I480 Paroxysmal atrial fibrillation: Secondary | ICD-10-CM | POA: Diagnosis not present

## 2020-07-02 DIAGNOSIS — D692 Other nonthrombocytopenic purpura: Secondary | ICD-10-CM

## 2020-07-02 DIAGNOSIS — F0281 Dementia in other diseases classified elsewhere with behavioral disturbance: Secondary | ICD-10-CM | POA: Diagnosis not present

## 2020-07-02 DIAGNOSIS — E871 Hypo-osmolality and hyponatremia: Secondary | ICD-10-CM

## 2020-07-02 DIAGNOSIS — F321 Major depressive disorder, single episode, moderate: Secondary | ICD-10-CM | POA: Diagnosis not present

## 2020-07-02 DIAGNOSIS — K5901 Slow transit constipation: Secondary | ICD-10-CM

## 2020-07-03 ENCOUNTER — Encounter: Payer: Self-pay | Admitting: Adult Health

## 2020-07-03 NOTE — Progress Notes (Signed)
Location:  Occupational psychologist of Service:  ALF (13) Provider:  [name], ANP [place] [phone]   [name], DO  Patient Care Team: [name], DO as PCP - General (Geriatric Medicine) Community, Well [name], MD as Consulting Physician (Gastroenterology) [name], [name], MD as Consulting Physician (Dermatology) [name], MD as Consulting Physician (Ophthalmology) Royal Hawthorn, NP as Nurse Practitioner (Nurse Practitioner)  Extended Emergency Contact Information Primary Emergency Contact: [facility] Address: [address]          [place], [place] [address] [name] of [place] Phone: [phone] Work Phone: [phone] Mobile Phone: [phone] Relation: Spouse  Code Status:  DNR Goals of care: Advanced Directive information Advanced Directives 07/03/2020  Does Patient Have a Medical Advance Directive? Yes  Type of Paramedic of [place];Living will  Does patient want to make changes to medical advance directive? -  Copy of [place] in Chart? Yes - validated most recent copy scanned in chart (See row information)  Would patient like information on creating a medical advance directive? -  Pre-existing out of facility DNR order (yellow form or pink MOST form) -     Chief Complaint  Patient presents with  . Medical Management of Chronic Issues    HPI:  Pt is a 86 y.o. female seen today for medical management of chronic diseases.    Ms. Kujala resides in the memory care setting and requires assistance with all ADLs and a lift for transfers. Her husband is at the bedside. There are no acute complaints from the nurse. She tends to resist care at times and needs cuing and coaching to participate. Has periods of slight increased anxiety but no significant issues with unstable mood. Unfortunately, she was transferred with a  caregiver and fell to the ground. She did not suffer any significant injuries but sustained multiple bruises. Her husband expressed that she enjoys juice rather than water and he would like her to be offered these beverages to avoid dehydration. We also reviewed her bowel regimen, 1/2 dose of miralax is enough for her most days as BMs are recorded every 1-2 days. Occasionally she needs MOM. Her weight has trended down to 115 lbs. 6 lb loss since May but prior to this she fluctuated between 115-120 and is a small eater overall.   Past Medical History:  Diagnosis Date  . Actinic keratoses   . Allergy   . Alzheimer disease ([place]) 05/21/2015  . Anxiety   . Arrhythmia    atrial fib  . Atrial fibrillation ([place]) 12/2012  . Basal cell carcinoma of skin   . Cataracts, both eyes 2005, 2013   Cataract extraction/IOL implant  . Current use of long term anticoagulation 12/2012   Eliquis  . Depression   . Diverticulosis 2008  . Fracture of scaphoid bone of hand 08/05/2004  . History of UTI   . Hyperlipidemia   . Hypertension   . Hyponatremia    SIADH  . Low sodium levels   . Mild cognitive impairment   . Osteopenia   . Rosacea   . Squamous cell carcinoma   . Thyroid nodule    Past Surgical History:  Procedure Laterality Date  . ABDOMINAL HYSTERECTOMY  1970   TAH-BSO  . cataracts Bilateral 2/05 and 3/05  . CESAREAN SECTION     questionable    Allergies  Allergen Reactions  . Aricept [Donepezil]   .  Solifenacin Other (See Comments)  . Ciprofloxacin Other (See Comments)    Reaction unknown  . Latex Other (See Comments)    Reaction unknown  . Macrobid [Nitrofurantoin Macrocrystal] Nausea And Vomiting  . Macrodantin [Nitrofurantoin] Nausea Only    Upset stomach  . Other Other (See Comments)    Popcorn- Diverticulosis  . Strawberry Extract Other (See Comments)    Diverticulosis   . Tramadol Nausea Only    Outpatient Encounter Medications as of 07/02/2020  Medication Sig  .  acetaminophen (TYLENOL) 325 MG tablet Take 650 mg by mouth every 6 (six) hours as needed.   Marland Kitchen apixaban (ELIQUIS) 2.5 MG TABS tablet Take 1 tablet (2.5 mg total) by mouth 2 (two) times daily.  . Calcium Carbonate-Vitamin D (CALCIUM 600+D PO) Take by mouth daily.  . cholecalciferol (VITAMIN D) 1000 UNITS tablet Take 2,000 Units by mouth daily.  . magnesium hydroxide (MILK OF MAGNESIA) 400 MG/5ML suspension Take 20 mLs by mouth daily as needed for mild constipation.  . metoprolol succinate (TOPROL-XL) 25 MG 24 hr tablet Take 12.5 mg by mouth daily.  . mirtazapine (REMERON) 15 MG tablet Take 15 mg at bedtime by mouth.  . Multiple Vitamins-Minerals (MULTI COMPLETE/IRON) TABS Take 1 tablet by mouth daily. 18-400 mg-mcg  . polyethylene glycol (MIRALAX / GLYCOLAX) packet 1/2 dose daily for constipation  . saccharomyces boulardii (FLORASTOR) 250 MG capsule Take 250 mg by mouth daily.  . sodium chloride 1 G tablet Take 1 g by mouth 2 (two) times daily with a meal.  . tobramycin (TOBREX) 0.3 % ophthalmic solution Place 1 drop into the left eye in the morning, at noon, in the evening, and at bedtime.  Marland Kitchen trimethoprim (TRIMPEX) 100 MG tablet Take 100 mg by mouth daily.  Marland Kitchen UNABLE TO FIND Med Name: Preservative Free Tears every hour while awake in both eyes wait 3-5 min between drops. DO NOT wake resident for eye drops per MD   No facility-administered encounter medications on file as of 07/02/2020.    Review of Systems  Unable to perform ROS: Dementia    Immunization History  Administered Date(s) Administered  . H1N1 12/04/2008  . Influenza, High Dose Seasonal PF 09/19/2019  . Influenza,inj,Quad PF,6+ Mos 09/11/2018  . Influenza-Unspecified 09/15/2003, 11/01/2010, 09/12/2011, 07/22/2016, 09/11/2017  . Moderna SARS-COVID-2 Vaccination 11/26/2019, 12/24/2019  . Pneumococcal Conjugate-13 04/14/2017  . Pneumococcal Polysaccharide-23 12/19/2008  . Td 09/15/2003, 01/06/2016  . Zoster Recombinat (Shingrix)  08/21/2017, 11/21/2017   Pertinent  Health Maintenance Due  Topic Date Due  . INFLUENZA VACCINE  06/21/2020  . DEXA SCAN  Completed  . PNA vac Low Risk Adult  Completed   Fall Risk  08/15/2018 07/12/2017 06/21/2017 05/04/2017 03/08/2017  Falls in the past year? No No No No No   Functional Status Survey:    Vitals:   07/03/20 1326  Weight: 115 lb 3.2 oz (52.3 kg)   Body mass index is 20.41 kg/m. Physical Exam Vitals and nursing note reviewed.  Constitutional:      General: She is not in acute distress.    Appearance: She is not diaphoretic.  HENT:     Head: Normocephalic and atraumatic.  Neck:     Vascular: No JVD.  Cardiovascular:     Rate and Rhythm: Normal rate. Rhythm irregular.     Heart sounds: No murmur heard.   Pulmonary:     Effort: Pulmonary effort is normal. No respiratory distress.     Breath sounds: Normal breath sounds. No wheezing.  Abdominal:     General: Bowel sounds are normal. There is no distension.     Palpations: Abdomen is soft.  Musculoskeletal:     Comments: Trace edema to BLE  Skin:    General: Skin is warm and dry.     Findings: Bruising (noted to BUE and BLE) present.  Neurological:     General: No focal deficit present.     Mental Status: She is alert. Mental status is at baseline.  Psychiatric:        Mood and Affect: Mood normal.     Labs reviewed: Recent Labs    05/11/20 0000  NA 139  K 4.5  CL 106  CO2 22  BUN 24*  CREATININE 0.9  CALCIUM 9.7   No results for input(s): AST, ALT, ALKPHOS, BILITOT, PROT, ALBUMIN in the last 8760 hours. Recent Labs    08/06/19 0000 05/11/20 0000  WBC 4.8 5.2  HGB 12.2 11.8*  HCT 36 36  PLT 209 203   Lab Results  Component Value Date   TSH 2.29 10/18/2018   No results found for: HGBA1C No results found for: CHOL, HDL, LDLCALC, LDLDIRECT, TRIG, CHOLHDL  Significant Diagnostic Results in last 30 days:  No results found.  Assessment/Plan  1. Late onset Alzheimer's disease with  behavioral disturbance ([place]) Progressive over the past year requiring more assistance. Continue supportive environment in the memory care setting.   2. Slow transit constipation Continue Miralax 1/2 dose daily and as needed MOM   3. Paroxysmal atrial fibrillation (HCC) Rate remains controlled with no new events Continues on Eliquis for CVA risk reduction with no s/e other than ecchymosis   4. Hyponatremia Continue Sodium tablets 1 gram bid   5. Depression, major, single episode, moderate (HCC) Continues on Remeron 15 mg qhs. Would not taper at this time due to risk of increased anxiety and apprehension  6. Senile purpura ([place]) Along with excessive ecchymosis associated with a recent fall    Labs/tests ordered: NA

## 2020-07-06 DIAGNOSIS — Z20828 Contact with and (suspected) exposure to other viral communicable diseases: Secondary | ICD-10-CM | POA: Diagnosis not present

## 2020-07-06 DIAGNOSIS — Z9189 Other specified personal risk factors, not elsewhere classified: Secondary | ICD-10-CM | POA: Diagnosis not present

## 2020-07-13 DIAGNOSIS — Z20828 Contact with and (suspected) exposure to other viral communicable diseases: Secondary | ICD-10-CM | POA: Diagnosis not present

## 2020-07-13 DIAGNOSIS — Z9189 Other specified personal risk factors, not elsewhere classified: Secondary | ICD-10-CM | POA: Diagnosis not present

## 2020-07-31 ENCOUNTER — Encounter: Payer: Self-pay | Admitting: Adult Health

## 2020-07-31 ENCOUNTER — Non-Acute Institutional Stay: Payer: Medicare Other | Admitting: Adult Health

## 2020-07-31 DIAGNOSIS — R634 Abnormal weight loss: Secondary | ICD-10-CM

## 2020-07-31 DIAGNOSIS — R55 Syncope and collapse: Secondary | ICD-10-CM

## 2020-07-31 DIAGNOSIS — G301 Alzheimer's disease with late onset: Secondary | ICD-10-CM | POA: Diagnosis not present

## 2020-07-31 DIAGNOSIS — F0281 Dementia in other diseases classified elsewhere with behavioral disturbance: Secondary | ICD-10-CM | POA: Diagnosis not present

## 2020-07-31 DIAGNOSIS — Z20828 Contact with and (suspected) exposure to other viral communicable diseases: Secondary | ICD-10-CM | POA: Diagnosis not present

## 2020-07-31 DIAGNOSIS — Z9189 Other specified personal risk factors, not elsewhere classified: Secondary | ICD-10-CM | POA: Diagnosis not present

## 2020-07-31 NOTE — Progress Notes (Addendum)
Location:  Occupational psychologist of Service:  ALF (13) Provider:   [name], ANP [place] [phone]   [name], DO  Patient Care Team: [name], DO as PCP - General (Geriatric Medicine) Community, Well [name], MD as Consulting Physician (Gastroenterology) [name], [name], MD as Consulting Physician (Dermatology) [name], MD as Consulting Physician (Ophthalmology) Royal Hawthorn, NP as Nurse Practitioner (Nurse Practitioner)  Extended Emergency Contact Information Primary Emergency Contact: [facility] Address: [address]          [place], [place] [address] [name] of [place] Phone: [phone] Work Phone: [phone] Mobile Phone: [phone] Relation: Spouse  Code Status:  DNR Goals of care: Advanced Directive information Advanced Directives 07/03/2020  Does Patient Have a Medical Advance Directive? Yes  Type of Paramedic of [place];Living will  Does patient want to make changes to medical advance directive? -  Copy of [place] in Chart? Yes - validated most recent copy scanned in chart (See row information)  Would patient like information on creating a medical advance directive? -  Pre-existing out of facility DNR order (yellow form or pink MOST form) -     Chief Complaint  Patient presents with  . Acute Visit    syncope    HPI:  Pt is a 86 y.o. female seen today for an acute visit for syncope. This morning I was asked to see Ms. Owens Shark for syncope. She was found to be non responsive by her caregiver and nurse for a few minutes at most by their account. She was in the upright position. There was no associated twitching or convulsions. Vitals were checked and were WNL. She has not had any other associated symptoms such as fever, cough, sob, cp, etc. She is not ambulatory and they are not able to obtain  orthostatics. She has progressive AD and has been eating less and losing weight. She has lost 12 lbs in the past few months. The nurse reports they are offering nutritional beverages such as Breeze.  There are no issues voiding per staff. Last BM was 2 days ago.  Wt Readings from Last 3 Encounters:  07/31/20 109 lb 9.6 oz (49.7 kg)  07/03/20 115 lb 3.2 oz (52.3 kg)  05/08/20 121 lb 9.6 oz (55.2 kg)      Past Medical History:  Diagnosis Date  . Actinic keratoses   . Allergy   . Alzheimer disease ([place]) 05/21/2015  . Anxiety   . Arrhythmia    atrial fib  . Atrial fibrillation ([place]) 12/2012  . Basal cell carcinoma of skin   . Cataracts, both eyes 2005, 2013   Cataract extraction/IOL implant  . Current use of long term anticoagulation 12/2012   Eliquis  . Depression   . Diverticulosis 2008  . Fracture of scaphoid bone of hand 08/05/2004  . History of UTI   . Hyperlipidemia   . Hypertension   . Hyponatremia    SIADH  . Low sodium levels   . Mild cognitive impairment   . Osteopenia   . Rosacea   . Squamous cell carcinoma   . Thyroid nodule    Past Surgical History:  Procedure Laterality Date  . ABDOMINAL HYSTERECTOMY  1970   TAH-BSO  . cataracts Bilateral 2/05 and 3/05  . CESAREAN SECTION     questionable    Allergies  Allergen Reactions  . Aricept [Donepezil]   . Solifenacin Other (  See Comments)  . Ciprofloxacin Other (See Comments)    Reaction unknown  . Latex Other (See Comments)    Reaction unknown  . Macrobid [Nitrofurantoin Macrocrystal] Nausea And Vomiting  . Macrodantin [Nitrofurantoin] Nausea Only    Upset stomach  . Other Other (See Comments)    Popcorn- Diverticulosis  . Strawberry Extract Other (See Comments)    Diverticulosis   . Tramadol Nausea Only    Outpatient Encounter Medications as of 07/31/2020  Medication Sig  . acetaminophen (TYLENOL) 325 MG tablet Take 650 mg by mouth every 6 (six) hours as needed.   Marland Kitchen apixaban (ELIQUIS) 2.5 MG TABS  tablet Take 1 tablet (2.5 mg total) by mouth 2 (two) times daily.  . Calcium Carbonate-Vitamin D (CALCIUM 600+D PO) Take by mouth daily.  . cholecalciferol (VITAMIN D) 1000 UNITS tablet Take 2,000 Units by mouth daily.  . magnesium hydroxide (MILK OF MAGNESIA) 400 MG/5ML suspension Take 20 mLs by mouth daily as needed for mild constipation.  . metoprolol succinate (TOPROL-XL) 25 MG 24 hr tablet Take 12.5 mg by mouth daily.  . mirtazapine (REMERON) 15 MG tablet Take 15 mg at bedtime by mouth.  . Multiple Vitamins-Minerals (MULTI COMPLETE/IRON) TABS Take 1 tablet by mouth daily. 18-400 mg-mcg  . polyethylene glycol (MIRALAX / GLYCOLAX) packet 1/2 dose daily for constipation  . saccharomyces boulardii (FLORASTOR) 250 MG capsule Take 250 mg by mouth daily.  . sodium chloride 1 G tablet Take 1 g by mouth 2 (two) times daily with a meal.  . tobramycin (TOBREX) 0.3 % ophthalmic solution Place 1 drop into the left eye in the morning, at noon, in the evening, and at bedtime.  Marland Kitchen trimethoprim (TRIMPEX) 100 MG tablet Take 100 mg by mouth daily.  Marland Kitchen UNABLE TO FIND Med Name: Preservative Free Tears every hour while awake in both eyes wait 3-5 min between drops. DO NOT wake resident for eye drops per MD   No facility-administered encounter medications on file as of 07/31/2020.    Review of Systems  Unable to perform ROS: Dementia    Immunization History  Administered Date(s) Administered  . H1N1 12/04/2008  . Influenza, High Dose Seasonal PF 09/19/2019  . Influenza,inj,Quad PF,6+ Mos 09/11/2018  . Influenza-Unspecified 09/15/2003, 11/01/2010, 09/12/2011, 07/22/2016, 09/11/2017  . Moderna SARS-COVID-2 Vaccination 11/26/2019, 12/24/2019  . Pneumococcal Conjugate-13 04/14/2017  . Pneumococcal Polysaccharide-23 12/19/2008  . Td 09/15/2003, 01/06/2016  . Zoster Recombinat (Shingrix) 08/21/2017, 11/21/2017   Pertinent  Health Maintenance Due  Topic Date Due  . INFLUENZA VACCINE  06/21/2020  . DEXA SCAN   Completed  . PNA vac Low Risk Adult  Completed   Fall Risk  08/15/2018 07/12/2017 06/21/2017 05/04/2017 03/08/2017  Falls in the past year? No No No No No   Functional Status Survey:    Vitals:   07/31/20 1322  BP: 125/65  Pulse: 67  Weight: 109 lb 9.6 oz (49.7 kg)   Body mass index is 19.41 kg/m. Physical Exam Vitals and nursing note reviewed.  Constitutional:      Comments: Frail thin appearing female  HENT:     Head: Normocephalic and atraumatic.     Right Ear: External ear normal.     Left Ear: External ear normal.     Ears:     Comments: Dry scaly skin to the right ear    Nose: Nose normal. No congestion.     Mouth/Throat:     Mouth: Mucous membranes are dry.     Pharynx: Oropharynx is  clear. No oropharyngeal exudate.  Eyes:     Conjunctiva/sclera: Conjunctivae normal.     Pupils: Pupils are equal, round, and reactive to light.  Cardiovascular:     Rate and Rhythm: Normal rate. Rhythm irregular.     Heart sounds: No murmur heard.   Pulmonary:     Effort: Pulmonary effort is normal.     Breath sounds: Normal breath sounds.  Abdominal:     General: There is no distension.     Palpations: Abdomen is soft.     Tenderness: There is no abdominal tenderness.     Comments: Hypoactive x 4   Musculoskeletal:     Right lower leg: No edema.     Left lower leg: No edema.  Skin:    General: Skin is warm.     Coloration: Skin is pale.  Neurological:     General: No focal deficit present.     Mental Status: She is alert. Mental status is at baseline.  Psychiatric:        Mood and Affect: Mood normal.     Labs reviewed: Recent Labs    05/11/20 0000  NA 139  K 4.5  CL 106  CO2 22  BUN 24*  CREATININE 0.9  CALCIUM 9.7   No results for input(s): AST, ALT, ALKPHOS, BILITOT, PROT, ALBUMIN in the last 8760 hours. Recent Labs    08/06/19 0000 05/11/20 0000  WBC 4.8 5.2  HGB 12.2 11.8*  HCT 36 36  PLT 209 203   Lab Results  Component Value Date   TSH 2.29  10/18/2018   No results found for: HGBA1C No results found for: CHOL, HDL, LDLCALC, LDLDIRECT, TRIG, CHOLHDL  Significant Diagnostic Results in last 30 days:  No results found.  Assessment/Plan 1. Syncope, unspecified syncope type Unclear etiology Differential would include dehydration, orthostatic hypotension, arrhythmia, etc. At this time she has no other acute findings and appears to be back at baseline. Given her advancing dementia it would not be beneficial to pursue additional testing at this time. She does appear slightly dry on exam and should be encourage to drink more fluid if she is able and agreeable. Monitor Vs qshift x 72 hrs and report abnormalities   2. Weight loss Due to decreased intake and progressive dementia. Continue nutritional supplements  3. Late onset Alzheimer's disease with behavioral disturbance ([place]) Progressing over the past year with decreased mobility and intake which is expected.  Continue supportive measures at [employer] staff Communication: discussed with Mr. Nikkel and her nurse Andee Poles  Labs/tests ordered:  NA

## 2020-08-06 DIAGNOSIS — Z20828 Contact with and (suspected) exposure to other viral communicable diseases: Secondary | ICD-10-CM | POA: Diagnosis not present

## 2020-08-06 DIAGNOSIS — Z9189 Other specified personal risk factors, not elsewhere classified: Secondary | ICD-10-CM | POA: Diagnosis not present

## 2020-08-10 DIAGNOSIS — Z9189 Other specified personal risk factors, not elsewhere classified: Secondary | ICD-10-CM | POA: Diagnosis not present

## 2020-08-10 DIAGNOSIS — Z20828 Contact with and (suspected) exposure to other viral communicable diseases: Secondary | ICD-10-CM | POA: Diagnosis not present

## 2020-08-13 ENCOUNTER — Encounter: Payer: Self-pay | Admitting: Internal Medicine

## 2020-08-13 DIAGNOSIS — H04123 Dry eye syndrome of bilateral lacrimal glands: Secondary | ICD-10-CM | POA: Diagnosis not present

## 2020-08-13 DIAGNOSIS — D631 Anemia in chronic kidney disease: Secondary | ICD-10-CM | POA: Diagnosis not present

## 2020-08-13 DIAGNOSIS — R55 Syncope and collapse: Secondary | ICD-10-CM | POA: Diagnosis not present

## 2020-08-13 DIAGNOSIS — R32 Unspecified urinary incontinence: Secondary | ICD-10-CM | POA: Diagnosis not present

## 2020-08-13 DIAGNOSIS — Z87440 Personal history of urinary (tract) infections: Secondary | ICD-10-CM | POA: Diagnosis not present

## 2020-08-13 DIAGNOSIS — Z681 Body mass index (BMI) 19 or less, adult: Secondary | ICD-10-CM | POA: Diagnosis not present

## 2020-08-13 DIAGNOSIS — R634 Abnormal weight loss: Secondary | ICD-10-CM | POA: Diagnosis not present

## 2020-08-13 DIAGNOSIS — Z7401 Bed confinement status: Secondary | ICD-10-CM | POA: Diagnosis not present

## 2020-08-13 DIAGNOSIS — I4891 Unspecified atrial fibrillation: Secondary | ICD-10-CM | POA: Diagnosis not present

## 2020-08-13 DIAGNOSIS — L719 Rosacea, unspecified: Secondary | ICD-10-CM | POA: Diagnosis not present

## 2020-08-13 DIAGNOSIS — G309 Alzheimer's disease, unspecified: Secondary | ICD-10-CM | POA: Diagnosis not present

## 2020-08-13 DIAGNOSIS — F0281 Dementia in other diseases classified elsewhere with behavioral disturbance: Secondary | ICD-10-CM | POA: Diagnosis not present

## 2020-08-13 DIAGNOSIS — E222 Syndrome of inappropriate secretion of antidiuretic hormone: Secondary | ICD-10-CM | POA: Diagnosis not present

## 2020-08-13 DIAGNOSIS — N183 Chronic kidney disease, stage 3 unspecified: Secondary | ICD-10-CM | POA: Diagnosis not present

## 2020-08-13 DIAGNOSIS — F418 Other specified anxiety disorders: Secondary | ICD-10-CM | POA: Diagnosis not present

## 2020-08-13 DIAGNOSIS — R159 Full incontinence of feces: Secondary | ICD-10-CM | POA: Diagnosis not present

## 2020-08-14 DIAGNOSIS — I4891 Unspecified atrial fibrillation: Secondary | ICD-10-CM | POA: Diagnosis not present

## 2020-08-14 DIAGNOSIS — Z87440 Personal history of urinary (tract) infections: Secondary | ICD-10-CM | POA: Diagnosis not present

## 2020-08-14 DIAGNOSIS — R634 Abnormal weight loss: Secondary | ICD-10-CM | POA: Diagnosis not present

## 2020-08-14 DIAGNOSIS — F0281 Dementia in other diseases classified elsewhere with behavioral disturbance: Secondary | ICD-10-CM | POA: Diagnosis not present

## 2020-08-14 DIAGNOSIS — G309 Alzheimer's disease, unspecified: Secondary | ICD-10-CM | POA: Diagnosis not present

## 2020-08-14 DIAGNOSIS — E222 Syndrome of inappropriate secretion of antidiuretic hormone: Secondary | ICD-10-CM | POA: Diagnosis not present

## 2020-08-15 ENCOUNTER — Encounter: Payer: Self-pay | Admitting: Internal Medicine

## 2020-08-17 DIAGNOSIS — Z20828 Contact with and (suspected) exposure to other viral communicable diseases: Secondary | ICD-10-CM | POA: Diagnosis not present

## 2020-08-17 DIAGNOSIS — F0281 Dementia in other diseases classified elsewhere with behavioral disturbance: Secondary | ICD-10-CM | POA: Diagnosis not present

## 2020-08-17 DIAGNOSIS — R634 Abnormal weight loss: Secondary | ICD-10-CM | POA: Diagnosis not present

## 2020-08-17 DIAGNOSIS — Z9189 Other specified personal risk factors, not elsewhere classified: Secondary | ICD-10-CM | POA: Diagnosis not present

## 2020-08-17 DIAGNOSIS — I4891 Unspecified atrial fibrillation: Secondary | ICD-10-CM | POA: Diagnosis not present

## 2020-08-17 DIAGNOSIS — G309 Alzheimer's disease, unspecified: Secondary | ICD-10-CM | POA: Diagnosis not present

## 2020-08-17 DIAGNOSIS — Z87440 Personal history of urinary (tract) infections: Secondary | ICD-10-CM | POA: Diagnosis not present

## 2020-08-17 DIAGNOSIS — E222 Syndrome of inappropriate secretion of antidiuretic hormone: Secondary | ICD-10-CM | POA: Diagnosis not present

## 2020-08-18 DIAGNOSIS — R634 Abnormal weight loss: Secondary | ICD-10-CM | POA: Diagnosis not present

## 2020-08-18 DIAGNOSIS — Z87440 Personal history of urinary (tract) infections: Secondary | ICD-10-CM | POA: Diagnosis not present

## 2020-08-18 DIAGNOSIS — F0281 Dementia in other diseases classified elsewhere with behavioral disturbance: Secondary | ICD-10-CM | POA: Diagnosis not present

## 2020-08-18 DIAGNOSIS — G309 Alzheimer's disease, unspecified: Secondary | ICD-10-CM | POA: Diagnosis not present

## 2020-08-18 DIAGNOSIS — E222 Syndrome of inappropriate secretion of antidiuretic hormone: Secondary | ICD-10-CM | POA: Diagnosis not present

## 2020-08-18 DIAGNOSIS — I4891 Unspecified atrial fibrillation: Secondary | ICD-10-CM | POA: Diagnosis not present

## 2020-08-20 DIAGNOSIS — E222 Syndrome of inappropriate secretion of antidiuretic hormone: Secondary | ICD-10-CM | POA: Diagnosis not present

## 2020-08-20 DIAGNOSIS — Z87440 Personal history of urinary (tract) infections: Secondary | ICD-10-CM | POA: Diagnosis not present

## 2020-08-20 DIAGNOSIS — F0281 Dementia in other diseases classified elsewhere with behavioral disturbance: Secondary | ICD-10-CM | POA: Diagnosis not present

## 2020-08-20 DIAGNOSIS — R634 Abnormal weight loss: Secondary | ICD-10-CM | POA: Diagnosis not present

## 2020-08-20 DIAGNOSIS — G309 Alzheimer's disease, unspecified: Secondary | ICD-10-CM | POA: Diagnosis not present

## 2020-08-20 DIAGNOSIS — I4891 Unspecified atrial fibrillation: Secondary | ICD-10-CM | POA: Diagnosis not present

## 2020-08-21 ENCOUNTER — Encounter: Payer: Self-pay | Admitting: Internal Medicine

## 2020-08-21 DIAGNOSIS — F418 Other specified anxiety disorders: Secondary | ICD-10-CM | POA: Diagnosis not present

## 2020-08-21 DIAGNOSIS — L719 Rosacea, unspecified: Secondary | ICD-10-CM | POA: Diagnosis not present

## 2020-08-21 DIAGNOSIS — H04123 Dry eye syndrome of bilateral lacrimal glands: Secondary | ICD-10-CM | POA: Diagnosis not present

## 2020-08-21 DIAGNOSIS — R634 Abnormal weight loss: Secondary | ICD-10-CM | POA: Diagnosis not present

## 2020-08-21 DIAGNOSIS — G309 Alzheimer's disease, unspecified: Secondary | ICD-10-CM | POA: Diagnosis not present

## 2020-08-21 DIAGNOSIS — Z87440 Personal history of urinary (tract) infections: Secondary | ICD-10-CM | POA: Diagnosis not present

## 2020-08-21 DIAGNOSIS — R55 Syncope and collapse: Secondary | ICD-10-CM | POA: Diagnosis not present

## 2020-08-21 DIAGNOSIS — Z7401 Bed confinement status: Secondary | ICD-10-CM | POA: Diagnosis not present

## 2020-08-21 DIAGNOSIS — I4891 Unspecified atrial fibrillation: Secondary | ICD-10-CM | POA: Diagnosis not present

## 2020-08-21 DIAGNOSIS — Z681 Body mass index (BMI) 19 or less, adult: Secondary | ICD-10-CM | POA: Diagnosis not present

## 2020-08-21 DIAGNOSIS — D631 Anemia in chronic kidney disease: Secondary | ICD-10-CM | POA: Diagnosis not present

## 2020-08-21 DIAGNOSIS — F0281 Dementia in other diseases classified elsewhere with behavioral disturbance: Secondary | ICD-10-CM | POA: Diagnosis not present

## 2020-08-21 DIAGNOSIS — E222 Syndrome of inappropriate secretion of antidiuretic hormone: Secondary | ICD-10-CM | POA: Diagnosis not present

## 2020-08-21 DIAGNOSIS — N183 Chronic kidney disease, stage 3 unspecified: Secondary | ICD-10-CM | POA: Diagnosis not present

## 2020-08-21 DIAGNOSIS — R159 Full incontinence of feces: Secondary | ICD-10-CM | POA: Diagnosis not present

## 2020-08-21 DIAGNOSIS — R32 Unspecified urinary incontinence: Secondary | ICD-10-CM | POA: Diagnosis not present

## 2020-08-27 ENCOUNTER — Non-Acute Institutional Stay: Payer: Medicare Other | Admitting: Adult Health

## 2020-08-27 ENCOUNTER — Encounter: Payer: Self-pay | Admitting: Adult Health

## 2020-08-27 DIAGNOSIS — R634 Abnormal weight loss: Secondary | ICD-10-CM | POA: Diagnosis not present

## 2020-08-27 DIAGNOSIS — E871 Hypo-osmolality and hyponatremia: Secondary | ICD-10-CM | POA: Diagnosis not present

## 2020-08-27 DIAGNOSIS — G301 Alzheimer's disease with late onset: Secondary | ICD-10-CM

## 2020-08-27 DIAGNOSIS — I4891 Unspecified atrial fibrillation: Secondary | ICD-10-CM | POA: Diagnosis not present

## 2020-08-27 DIAGNOSIS — F0281 Dementia in other diseases classified elsewhere with behavioral disturbance: Secondary | ICD-10-CM | POA: Diagnosis not present

## 2020-08-27 DIAGNOSIS — Z87440 Personal history of urinary (tract) infections: Secondary | ICD-10-CM

## 2020-08-27 DIAGNOSIS — I480 Paroxysmal atrial fibrillation: Secondary | ICD-10-CM

## 2020-08-27 DIAGNOSIS — E222 Syndrome of inappropriate secretion of antidiuretic hormone: Secondary | ICD-10-CM | POA: Diagnosis not present

## 2020-08-27 DIAGNOSIS — K5901 Slow transit constipation: Secondary | ICD-10-CM | POA: Diagnosis not present

## 2020-08-27 DIAGNOSIS — G309 Alzheimer's disease, unspecified: Secondary | ICD-10-CM | POA: Diagnosis not present

## 2020-08-27 NOTE — Progress Notes (Signed)
Location:  Occupational psychologist of Service:  ALF (13) Provider:   [name], ANP [place] [phone]   [name], DO  Patient Care Team: [name], DO as PCP - General (Geriatric Medicine) Community, Well [name], MD as Consulting Physician (Gastroenterology) [name], [name], MD as Consulting Physician (Dermatology) [name], MD as Consulting Physician (Ophthalmology) Royal Hawthorn, NP as Nurse Practitioner (Nurse Practitioner)  Extended Emergency Contact Information Primary Emergency Contact: [facility] Address: [address]          [place], [place] [address] [name] of [place] Phone: [phone] Work Phone: [phone] Mobile Phone: [phone] Relation: Spouse  Code Status:  DNR Goals of care: Advanced Directive information Advanced Directives 07/03/2020  Does Patient Have a Medical Advance Directive? Yes  Type of Paramedic of [place];Living will  Does patient want to make changes to medical advance directive? -  Copy of [place] in Chart? Yes - validated most recent copy scanned in chart (See row information)  Would patient like information on creating a medical advance directive? -  Pre-existing out of facility DNR order (yellow form or pink MOST form) -     Chief Complaint  Patient presents with  . Medical Management of Chronic Issues    HPI:  Pt is a 86 y.o. adult seen today for medical management of chronic diseases.   The nurse reports that Ms. Derrington is intermittently refusing her medications. This tends to be worse in the evening hrs. She has progressive dementia and is now followed by Hospice care. Due to her hospice status and difficulty consuming medications we are reviewing her list today to see which meds can be discontinued or reduced. Ms. Stella has afib and takes Eliquis for CVA risk reduction,  metoprolol for rate control, and sodium tabs for hyponatremia. Apparently the sodium tabs taste bad. Her HR runs in the 50's regularly and there are no recent incidences of afib with RVR. In addition, her intake has decreased and she is down 7 lbs in the past month.   Past Medical History:  Diagnosis Date  . Actinic keratoses   . Allergy   . Alzheimer disease ([place]) 05/21/2015  . Anxiety   . Arrhythmia    atrial fib  . Atrial fibrillation ([place]) 12/2012  . Basal cell carcinoma of skin   . Cataracts, both eyes 2005, 2013   Cataract extraction/IOL implant  . Current use of long term anticoagulation 12/2012   Eliquis  . Depression   . Diverticulosis 2008  . Fracture of scaphoid bone of hand 08/05/2004  . History of UTI   . Hyperlipidemia   . Hypertension   . Hyponatremia    SIADH  . Low sodium levels   . Mild cognitive impairment   . Osteopenia   . Rosacea   . Squamous cell carcinoma   . Thyroid nodule    Past Surgical History:  Procedure Laterality Date  . ABDOMINAL HYSTERECTOMY  1970   TAH-BSO  . cataracts Bilateral 2/05 and 3/05  . CESAREAN SECTION     questionable    Allergies  Allergen Reactions  . Aricept [Donepezil]   . Solifenacin Other (See Comments)  . Ciprofloxacin Other (See Comments)    Reaction unknown  . Latex Other (See Comments)    Reaction unknown  . Macrobid [Nitrofurantoin Macrocrystal] Nausea And Vomiting  . Macrodantin [Nitrofurantoin] Nausea Only    Upset stomach  .  Other Other (See Comments)    Popcorn- Diverticulosis  . Strawberry Extract Other (See Comments)    Diverticulosis   . Tramadol Nausea Only    Outpatient Encounter Medications as of 08/27/2020  Medication Sig  . acetaminophen (TYLENOL) 325 MG tablet Take 650 mg by mouth every 6 (six) hours as needed.   . magnesium hydroxide (MILK OF MAGNESIA) 400 MG/5ML suspension Take 20 mLs by mouth daily as needed for mild constipation.  . metoprolol succinate (TOPROL-XL) 25 MG 24 hr tablet  Take 12.5 mg by mouth every other day.   . mirtazapine (REMERON) 15 MG tablet Take 15 mg at bedtime by mouth.  . polyethylene glycol (MIRALAX / GLYCOLAX) packet 1/2 dose daily for constipation  . saccharomyces boulardii (FLORASTOR) 250 MG capsule Take 250 mg by mouth daily.  . sodium chloride 1 G tablet Take 1 g by mouth daily.   Marland Kitchen tobramycin (TOBREX) 0.3 % ophthalmic solution Place 1 drop into the left eye in the morning, at noon, in the evening, and at bedtime.  Marland Kitchen trimethoprim (TRIMPEX) 100 MG tablet Take 100 mg by mouth daily.  Marland Kitchen UNABLE TO FIND Med Name: Preservative Free Tears every hour while awake in both eyes wait 3-5 min between drops. DO NOT wake resident for eye drops per MD  . [DISCONTINUED] apixaban (ELIQUIS) 2.5 MG TABS tablet Take 1 tablet (2.5 mg total) by mouth 2 (two) times daily.  . [DISCONTINUED] Calcium Carbonate-Vitamin D (CALCIUM 600+D PO) Take by mouth daily.  . [DISCONTINUED] cholecalciferol (VITAMIN D) 1000 UNITS tablet Take 2,000 Units by mouth daily.  . [DISCONTINUED] Multiple Vitamins-Minerals (MULTI COMPLETE/IRON) TABS Take 1 tablet by mouth daily. 18-400 mg-mcg   No facility-administered encounter medications on file as of 08/27/2020.    Review of Systems  Unable to perform ROS: Dementia    Immunization History  Administered Date(s) Administered  . H1N1 12/04/2008  . Influenza, High Dose Seasonal PF 09/19/2019  . Influenza,inj,Quad PF,6+ Mos 09/11/2018  . Influenza-Unspecified 09/15/2003, 11/01/2010, 09/12/2011, 07/22/2016, 09/11/2017  . Moderna SARS-COVID-2 Vaccination 11/26/2019, 12/24/2019  . Pneumococcal Conjugate-13 04/14/2017  . Pneumococcal Polysaccharide-23 12/19/2008  . Td 09/15/2003, 01/06/2016  . Zoster Recombinat (Shingrix) 08/21/2017, 11/21/2017   Pertinent  Health Maintenance Due  Topic Date Due  . INFLUENZA VACCINE  Completed  . DEXA SCAN  Completed  . PNA vac Low Risk Adult  Completed   Fall Risk  08/15/2018 07/12/2017 06/21/2017  05/04/2017 03/08/2017  Falls in the past year? No No No No No   Functional Status Survey:    Vitals:   08/27/20 1651  Weight: 108 lb 12.8 oz (49.4 kg)   Body mass index is 19.27 kg/m.  Wt Readings from Last 3 Encounters:  08/27/20 108 lb 12.8 oz (49.4 kg)  07/31/20 109 lb 9.6 oz (49.7 kg)  07/03/20 115 lb 3.2 oz (52.3 kg)    Physical Exam Vitals and nursing note reviewed.  Constitutional:      General: She is not in acute distress.    Appearance: She is not diaphoretic.  HENT:     Head: Normocephalic and atraumatic.     Mouth/Throat:     Mouth: Mucous membranes are moist.     Pharynx: Oropharynx is clear.  Neck:     Vascular: No JVD.  Cardiovascular:     Rate and Rhythm: Normal rate. Rhythm irregular.     Heart sounds: No murmur heard.   Pulmonary:     Effort: Pulmonary effort is normal. No respiratory distress.  Breath sounds: Normal breath sounds. No wheezing.  Abdominal:     General: Bowel sounds are normal. There is no distension.     Palpations: Abdomen is soft.     Tenderness: There is no abdominal tenderness.  Musculoskeletal:     Comments: Trace edema to BLE  Skin:    General: Skin is warm and dry.  Neurological:     General: No focal deficit present.     Mental Status: She is alert. Mental status is at baseline.  Psychiatric:        Mood and Affect: Mood normal.     Labs reviewed: Recent Labs    05/11/20 0000  NA 139  K 4.5  CL 106  CO2 22  BUN 24*  CREATININE 0.9  CALCIUM 9.7   No results for input(s): AST, ALT, ALKPHOS, BILITOT, PROT, ALBUMIN in the last 8760 hours. Recent Labs    05/11/20 0000  WBC 5.2  HGB 11.8*  HCT 36  PLT 203   Lab Results  Component Value Date   TSH 2.29 10/18/2018   No results found for: HGBA1C No results found for: CHOL, HDL, LDLCALC, LDLDIRECT, TRIG, CHOLHDL  Significant Diagnostic Results in last 30 days:  No results found.  Assessment/Plan 1. Late onset Alzheimer's disease with behavioral  disturbance ([place]) Progressive decline in cognition and physical function c/w the disease. Continue supportive care in the memory care environment. Followed by hospice services   2. Paroxysmal atrial fibrillation [facility]) Ms. Decamp has been intermittently refusing her medications, including Eliquis. Due to her progressive decline she no longer benefits from this medication. In addition, she bruises easily and has fragile skin. Per review of recommendations from Dr. Mariea Clonts it is appropriate to discontinue this medication at this time as the risk outweighs the benefit. She is followed by Hospice and this is in line with their approach to care and an appropriate recommendation for this resident. Her HR runs in the 50's and she is also refusing the metoprolol at times. Will reduce to qod and monitor resident.   3. Hyponatremia Reduce sodium tabs to 1 tab daily due to refusal. Will follow BMP periodically.  4. History of recurrent UTIs Continues on trimethoprim for UTI prevention. If she consistently refuses this med we could discontinue but they are going to try to give all meds in the morning as this is a better time for her for compliance.   5. Slow transit constipation Controlled with miralax 1/2 cap daily   6. Weight loss Continue boost daily   Family/ staff Communication: discussed with Mr. Hasty  Labs/tests ordered:  NA

## 2020-09-03 DIAGNOSIS — E222 Syndrome of inappropriate secretion of antidiuretic hormone: Secondary | ICD-10-CM | POA: Diagnosis not present

## 2020-09-03 DIAGNOSIS — R634 Abnormal weight loss: Secondary | ICD-10-CM | POA: Diagnosis not present

## 2020-09-03 DIAGNOSIS — I4891 Unspecified atrial fibrillation: Secondary | ICD-10-CM | POA: Diagnosis not present

## 2020-09-03 DIAGNOSIS — Z87440 Personal history of urinary (tract) infections: Secondary | ICD-10-CM | POA: Diagnosis not present

## 2020-09-03 DIAGNOSIS — G309 Alzheimer's disease, unspecified: Secondary | ICD-10-CM | POA: Diagnosis not present

## 2020-09-03 DIAGNOSIS — F0281 Dementia in other diseases classified elsewhere with behavioral disturbance: Secondary | ICD-10-CM | POA: Diagnosis not present

## 2020-09-07 DIAGNOSIS — E222 Syndrome of inappropriate secretion of antidiuretic hormone: Secondary | ICD-10-CM | POA: Diagnosis not present

## 2020-09-07 DIAGNOSIS — R634 Abnormal weight loss: Secondary | ICD-10-CM | POA: Diagnosis not present

## 2020-09-07 DIAGNOSIS — I4891 Unspecified atrial fibrillation: Secondary | ICD-10-CM | POA: Diagnosis not present

## 2020-09-07 DIAGNOSIS — F0281 Dementia in other diseases classified elsewhere with behavioral disturbance: Secondary | ICD-10-CM | POA: Diagnosis not present

## 2020-09-07 DIAGNOSIS — G309 Alzheimer's disease, unspecified: Secondary | ICD-10-CM | POA: Diagnosis not present

## 2020-09-07 DIAGNOSIS — Z87440 Personal history of urinary (tract) infections: Secondary | ICD-10-CM | POA: Diagnosis not present

## 2020-09-11 DIAGNOSIS — Z23 Encounter for immunization: Secondary | ICD-10-CM | POA: Diagnosis not present

## 2020-09-17 DIAGNOSIS — Z87440 Personal history of urinary (tract) infections: Secondary | ICD-10-CM | POA: Diagnosis not present

## 2020-09-17 DIAGNOSIS — F0281 Dementia in other diseases classified elsewhere with behavioral disturbance: Secondary | ICD-10-CM | POA: Diagnosis not present

## 2020-09-17 DIAGNOSIS — R634 Abnormal weight loss: Secondary | ICD-10-CM | POA: Diagnosis not present

## 2020-09-17 DIAGNOSIS — G309 Alzheimer's disease, unspecified: Secondary | ICD-10-CM | POA: Diagnosis not present

## 2020-09-17 DIAGNOSIS — E222 Syndrome of inappropriate secretion of antidiuretic hormone: Secondary | ICD-10-CM | POA: Diagnosis not present

## 2020-09-17 DIAGNOSIS — I4891 Unspecified atrial fibrillation: Secondary | ICD-10-CM | POA: Diagnosis not present

## 2020-09-21 DIAGNOSIS — D631 Anemia in chronic kidney disease: Secondary | ICD-10-CM | POA: Diagnosis not present

## 2020-09-21 DIAGNOSIS — R159 Full incontinence of feces: Secondary | ICD-10-CM | POA: Diagnosis not present

## 2020-09-21 DIAGNOSIS — N183 Chronic kidney disease, stage 3 unspecified: Secondary | ICD-10-CM | POA: Diagnosis not present

## 2020-09-21 DIAGNOSIS — F0281 Dementia in other diseases classified elsewhere with behavioral disturbance: Secondary | ICD-10-CM | POA: Diagnosis not present

## 2020-09-21 DIAGNOSIS — Z87440 Personal history of urinary (tract) infections: Secondary | ICD-10-CM | POA: Diagnosis not present

## 2020-09-21 DIAGNOSIS — F418 Other specified anxiety disorders: Secondary | ICD-10-CM | POA: Diagnosis not present

## 2020-09-21 DIAGNOSIS — Z7401 Bed confinement status: Secondary | ICD-10-CM | POA: Diagnosis not present

## 2020-09-21 DIAGNOSIS — G309 Alzheimer's disease, unspecified: Secondary | ICD-10-CM | POA: Diagnosis not present

## 2020-09-21 DIAGNOSIS — R55 Syncope and collapse: Secondary | ICD-10-CM | POA: Diagnosis not present

## 2020-09-21 DIAGNOSIS — L719 Rosacea, unspecified: Secondary | ICD-10-CM | POA: Diagnosis not present

## 2020-09-21 DIAGNOSIS — E222 Syndrome of inappropriate secretion of antidiuretic hormone: Secondary | ICD-10-CM | POA: Diagnosis not present

## 2020-09-21 DIAGNOSIS — H04123 Dry eye syndrome of bilateral lacrimal glands: Secondary | ICD-10-CM | POA: Diagnosis not present

## 2020-09-21 DIAGNOSIS — Z681 Body mass index (BMI) 19 or less, adult: Secondary | ICD-10-CM | POA: Diagnosis not present

## 2020-09-21 DIAGNOSIS — I4891 Unspecified atrial fibrillation: Secondary | ICD-10-CM | POA: Diagnosis not present

## 2020-09-21 DIAGNOSIS — R32 Unspecified urinary incontinence: Secondary | ICD-10-CM | POA: Diagnosis not present

## 2020-09-21 DIAGNOSIS — R634 Abnormal weight loss: Secondary | ICD-10-CM | POA: Diagnosis not present

## 2020-09-22 DIAGNOSIS — I4891 Unspecified atrial fibrillation: Secondary | ICD-10-CM | POA: Diagnosis not present

## 2020-09-22 DIAGNOSIS — Z87440 Personal history of urinary (tract) infections: Secondary | ICD-10-CM | POA: Diagnosis not present

## 2020-09-22 DIAGNOSIS — F0281 Dementia in other diseases classified elsewhere with behavioral disturbance: Secondary | ICD-10-CM | POA: Diagnosis not present

## 2020-09-22 DIAGNOSIS — G309 Alzheimer's disease, unspecified: Secondary | ICD-10-CM | POA: Diagnosis not present

## 2020-09-22 DIAGNOSIS — R634 Abnormal weight loss: Secondary | ICD-10-CM | POA: Diagnosis not present

## 2020-09-22 DIAGNOSIS — E222 Syndrome of inappropriate secretion of antidiuretic hormone: Secondary | ICD-10-CM | POA: Diagnosis not present

## 2020-09-29 DIAGNOSIS — G309 Alzheimer's disease, unspecified: Secondary | ICD-10-CM | POA: Diagnosis not present

## 2020-09-29 DIAGNOSIS — R634 Abnormal weight loss: Secondary | ICD-10-CM | POA: Diagnosis not present

## 2020-09-29 DIAGNOSIS — Z87440 Personal history of urinary (tract) infections: Secondary | ICD-10-CM | POA: Diagnosis not present

## 2020-09-29 DIAGNOSIS — F0281 Dementia in other diseases classified elsewhere with behavioral disturbance: Secondary | ICD-10-CM | POA: Diagnosis not present

## 2020-09-29 DIAGNOSIS — E222 Syndrome of inappropriate secretion of antidiuretic hormone: Secondary | ICD-10-CM | POA: Diagnosis not present

## 2020-09-29 DIAGNOSIS — I4891 Unspecified atrial fibrillation: Secondary | ICD-10-CM | POA: Diagnosis not present

## 2020-09-30 DIAGNOSIS — G309 Alzheimer's disease, unspecified: Secondary | ICD-10-CM | POA: Diagnosis not present

## 2020-09-30 DIAGNOSIS — F0281 Dementia in other diseases classified elsewhere with behavioral disturbance: Secondary | ICD-10-CM | POA: Diagnosis not present

## 2020-09-30 DIAGNOSIS — Z87440 Personal history of urinary (tract) infections: Secondary | ICD-10-CM | POA: Diagnosis not present

## 2020-09-30 DIAGNOSIS — R634 Abnormal weight loss: Secondary | ICD-10-CM | POA: Diagnosis not present

## 2020-09-30 DIAGNOSIS — Z23 Encounter for immunization: Secondary | ICD-10-CM | POA: Diagnosis not present

## 2020-09-30 DIAGNOSIS — E222 Syndrome of inappropriate secretion of antidiuretic hormone: Secondary | ICD-10-CM | POA: Diagnosis not present

## 2020-09-30 DIAGNOSIS — I4891 Unspecified atrial fibrillation: Secondary | ICD-10-CM | POA: Diagnosis not present

## 2020-10-02 DIAGNOSIS — E222 Syndrome of inappropriate secretion of antidiuretic hormone: Secondary | ICD-10-CM | POA: Diagnosis not present

## 2020-10-02 DIAGNOSIS — F0281 Dementia in other diseases classified elsewhere with behavioral disturbance: Secondary | ICD-10-CM | POA: Diagnosis not present

## 2020-10-02 DIAGNOSIS — R634 Abnormal weight loss: Secondary | ICD-10-CM | POA: Diagnosis not present

## 2020-10-02 DIAGNOSIS — Z87440 Personal history of urinary (tract) infections: Secondary | ICD-10-CM | POA: Diagnosis not present

## 2020-10-02 DIAGNOSIS — I4891 Unspecified atrial fibrillation: Secondary | ICD-10-CM | POA: Diagnosis not present

## 2020-10-02 DIAGNOSIS — G309 Alzheimer's disease, unspecified: Secondary | ICD-10-CM | POA: Diagnosis not present

## 2020-10-04 ENCOUNTER — Encounter: Payer: Self-pay | Admitting: Internal Medicine

## 2020-10-07 DIAGNOSIS — E222 Syndrome of inappropriate secretion of antidiuretic hormone: Secondary | ICD-10-CM | POA: Diagnosis not present

## 2020-10-07 DIAGNOSIS — F0281 Dementia in other diseases classified elsewhere with behavioral disturbance: Secondary | ICD-10-CM | POA: Diagnosis not present

## 2020-10-07 DIAGNOSIS — G309 Alzheimer's disease, unspecified: Secondary | ICD-10-CM | POA: Diagnosis not present

## 2020-10-07 DIAGNOSIS — R634 Abnormal weight loss: Secondary | ICD-10-CM | POA: Diagnosis not present

## 2020-10-07 DIAGNOSIS — I4891 Unspecified atrial fibrillation: Secondary | ICD-10-CM | POA: Diagnosis not present

## 2020-10-07 DIAGNOSIS — Z87440 Personal history of urinary (tract) infections: Secondary | ICD-10-CM | POA: Diagnosis not present

## 2020-10-13 DIAGNOSIS — I4891 Unspecified atrial fibrillation: Secondary | ICD-10-CM | POA: Diagnosis not present

## 2020-10-13 DIAGNOSIS — G309 Alzheimer's disease, unspecified: Secondary | ICD-10-CM | POA: Diagnosis not present

## 2020-10-13 DIAGNOSIS — Z87440 Personal history of urinary (tract) infections: Secondary | ICD-10-CM | POA: Diagnosis not present

## 2020-10-13 DIAGNOSIS — F0281 Dementia in other diseases classified elsewhere with behavioral disturbance: Secondary | ICD-10-CM | POA: Diagnosis not present

## 2020-10-13 DIAGNOSIS — R634 Abnormal weight loss: Secondary | ICD-10-CM | POA: Diagnosis not present

## 2020-10-13 DIAGNOSIS — E222 Syndrome of inappropriate secretion of antidiuretic hormone: Secondary | ICD-10-CM | POA: Diagnosis not present

## 2020-10-20 DIAGNOSIS — E222 Syndrome of inappropriate secretion of antidiuretic hormone: Secondary | ICD-10-CM | POA: Diagnosis not present

## 2020-10-20 DIAGNOSIS — I4891 Unspecified atrial fibrillation: Secondary | ICD-10-CM | POA: Diagnosis not present

## 2020-10-20 DIAGNOSIS — R634 Abnormal weight loss: Secondary | ICD-10-CM | POA: Diagnosis not present

## 2020-10-20 DIAGNOSIS — F0281 Dementia in other diseases classified elsewhere with behavioral disturbance: Secondary | ICD-10-CM | POA: Diagnosis not present

## 2020-10-20 DIAGNOSIS — Z87440 Personal history of urinary (tract) infections: Secondary | ICD-10-CM | POA: Diagnosis not present

## 2020-10-20 DIAGNOSIS — G309 Alzheimer's disease, unspecified: Secondary | ICD-10-CM | POA: Diagnosis not present

## 2020-10-21 DIAGNOSIS — R159 Full incontinence of feces: Secondary | ICD-10-CM | POA: Diagnosis not present

## 2020-10-21 DIAGNOSIS — F0281 Dementia in other diseases classified elsewhere with behavioral disturbance: Secondary | ICD-10-CM | POA: Diagnosis not present

## 2020-10-21 DIAGNOSIS — Z7401 Bed confinement status: Secondary | ICD-10-CM | POA: Diagnosis not present

## 2020-10-21 DIAGNOSIS — N183 Chronic kidney disease, stage 3 unspecified: Secondary | ICD-10-CM | POA: Diagnosis not present

## 2020-10-21 DIAGNOSIS — Z87440 Personal history of urinary (tract) infections: Secondary | ICD-10-CM | POA: Diagnosis not present

## 2020-10-21 DIAGNOSIS — R634 Abnormal weight loss: Secondary | ICD-10-CM | POA: Diagnosis not present

## 2020-10-21 DIAGNOSIS — L719 Rosacea, unspecified: Secondary | ICD-10-CM | POA: Diagnosis not present

## 2020-10-21 DIAGNOSIS — H04123 Dry eye syndrome of bilateral lacrimal glands: Secondary | ICD-10-CM | POA: Diagnosis not present

## 2020-10-21 DIAGNOSIS — R55 Syncope and collapse: Secondary | ICD-10-CM | POA: Diagnosis not present

## 2020-10-21 DIAGNOSIS — E222 Syndrome of inappropriate secretion of antidiuretic hormone: Secondary | ICD-10-CM | POA: Diagnosis not present

## 2020-10-21 DIAGNOSIS — D631 Anemia in chronic kidney disease: Secondary | ICD-10-CM | POA: Diagnosis not present

## 2020-10-21 DIAGNOSIS — G309 Alzheimer's disease, unspecified: Secondary | ICD-10-CM | POA: Diagnosis not present

## 2020-10-21 DIAGNOSIS — F418 Other specified anxiety disorders: Secondary | ICD-10-CM | POA: Diagnosis not present

## 2020-10-21 DIAGNOSIS — Z681 Body mass index (BMI) 19 or less, adult: Secondary | ICD-10-CM | POA: Diagnosis not present

## 2020-10-21 DIAGNOSIS — R32 Unspecified urinary incontinence: Secondary | ICD-10-CM | POA: Diagnosis not present

## 2020-10-21 DIAGNOSIS — I4891 Unspecified atrial fibrillation: Secondary | ICD-10-CM | POA: Diagnosis not present

## 2020-10-23 DIAGNOSIS — E222 Syndrome of inappropriate secretion of antidiuretic hormone: Secondary | ICD-10-CM | POA: Diagnosis not present

## 2020-10-23 DIAGNOSIS — Z87440 Personal history of urinary (tract) infections: Secondary | ICD-10-CM | POA: Diagnosis not present

## 2020-10-23 DIAGNOSIS — F0281 Dementia in other diseases classified elsewhere with behavioral disturbance: Secondary | ICD-10-CM | POA: Diagnosis not present

## 2020-10-23 DIAGNOSIS — G309 Alzheimer's disease, unspecified: Secondary | ICD-10-CM | POA: Diagnosis not present

## 2020-10-23 DIAGNOSIS — I4891 Unspecified atrial fibrillation: Secondary | ICD-10-CM | POA: Diagnosis not present

## 2020-10-23 DIAGNOSIS — R634 Abnormal weight loss: Secondary | ICD-10-CM | POA: Diagnosis not present

## 2020-10-27 DIAGNOSIS — Z87440 Personal history of urinary (tract) infections: Secondary | ICD-10-CM | POA: Diagnosis not present

## 2020-10-27 DIAGNOSIS — I4891 Unspecified atrial fibrillation: Secondary | ICD-10-CM | POA: Diagnosis not present

## 2020-10-27 DIAGNOSIS — G309 Alzheimer's disease, unspecified: Secondary | ICD-10-CM | POA: Diagnosis not present

## 2020-10-27 DIAGNOSIS — E222 Syndrome of inappropriate secretion of antidiuretic hormone: Secondary | ICD-10-CM | POA: Diagnosis not present

## 2020-10-27 DIAGNOSIS — F0281 Dementia in other diseases classified elsewhere with behavioral disturbance: Secondary | ICD-10-CM | POA: Diagnosis not present

## 2020-10-27 DIAGNOSIS — R634 Abnormal weight loss: Secondary | ICD-10-CM | POA: Diagnosis not present

## 2020-11-03 DIAGNOSIS — R634 Abnormal weight loss: Secondary | ICD-10-CM | POA: Diagnosis not present

## 2020-11-03 DIAGNOSIS — G309 Alzheimer's disease, unspecified: Secondary | ICD-10-CM | POA: Diagnosis not present

## 2020-11-03 DIAGNOSIS — Z87440 Personal history of urinary (tract) infections: Secondary | ICD-10-CM | POA: Diagnosis not present

## 2020-11-03 DIAGNOSIS — F0281 Dementia in other diseases classified elsewhere with behavioral disturbance: Secondary | ICD-10-CM | POA: Diagnosis not present

## 2020-11-03 DIAGNOSIS — I4891 Unspecified atrial fibrillation: Secondary | ICD-10-CM | POA: Diagnosis not present

## 2020-11-03 DIAGNOSIS — E222 Syndrome of inappropriate secretion of antidiuretic hormone: Secondary | ICD-10-CM | POA: Diagnosis not present

## 2020-11-10 DIAGNOSIS — G309 Alzheimer's disease, unspecified: Secondary | ICD-10-CM | POA: Diagnosis not present

## 2020-11-10 DIAGNOSIS — E222 Syndrome of inappropriate secretion of antidiuretic hormone: Secondary | ICD-10-CM | POA: Diagnosis not present

## 2020-11-10 DIAGNOSIS — F0281 Dementia in other diseases classified elsewhere with behavioral disturbance: Secondary | ICD-10-CM | POA: Diagnosis not present

## 2020-11-10 DIAGNOSIS — I4891 Unspecified atrial fibrillation: Secondary | ICD-10-CM | POA: Diagnosis not present

## 2020-11-10 DIAGNOSIS — Z87440 Personal history of urinary (tract) infections: Secondary | ICD-10-CM | POA: Diagnosis not present

## 2020-11-10 DIAGNOSIS — R634 Abnormal weight loss: Secondary | ICD-10-CM | POA: Diagnosis not present

## 2020-11-13 DIAGNOSIS — I4891 Unspecified atrial fibrillation: Secondary | ICD-10-CM | POA: Diagnosis not present

## 2020-11-13 DIAGNOSIS — G309 Alzheimer's disease, unspecified: Secondary | ICD-10-CM | POA: Diagnosis not present

## 2020-11-13 DIAGNOSIS — R634 Abnormal weight loss: Secondary | ICD-10-CM | POA: Diagnosis not present

## 2020-11-13 DIAGNOSIS — Z87440 Personal history of urinary (tract) infections: Secondary | ICD-10-CM | POA: Diagnosis not present

## 2020-11-13 DIAGNOSIS — F0281 Dementia in other diseases classified elsewhere with behavioral disturbance: Secondary | ICD-10-CM | POA: Diagnosis not present

## 2020-11-13 DIAGNOSIS — E222 Syndrome of inappropriate secretion of antidiuretic hormone: Secondary | ICD-10-CM | POA: Diagnosis not present

## 2020-11-17 DIAGNOSIS — R634 Abnormal weight loss: Secondary | ICD-10-CM | POA: Diagnosis not present

## 2020-11-17 DIAGNOSIS — I4891 Unspecified atrial fibrillation: Secondary | ICD-10-CM | POA: Diagnosis not present

## 2020-11-17 DIAGNOSIS — E222 Syndrome of inappropriate secretion of antidiuretic hormone: Secondary | ICD-10-CM | POA: Diagnosis not present

## 2020-11-17 DIAGNOSIS — F0281 Dementia in other diseases classified elsewhere with behavioral disturbance: Secondary | ICD-10-CM | POA: Diagnosis not present

## 2020-11-17 DIAGNOSIS — Z87440 Personal history of urinary (tract) infections: Secondary | ICD-10-CM | POA: Diagnosis not present

## 2020-11-17 DIAGNOSIS — G309 Alzheimer's disease, unspecified: Secondary | ICD-10-CM | POA: Diagnosis not present

## 2020-11-18 ENCOUNTER — Other Ambulatory Visit: Payer: Self-pay | Admitting: Internal Medicine

## 2020-11-18 MED ORDER — HYDROXYZINE HCL 10 MG PO TABS: 10.0000 mg | ORAL_TABLET | Freq: Every day | ORAL | 0 refills | Status: DC | PRN

## 2020-11-21 DIAGNOSIS — R634 Abnormal weight loss: Secondary | ICD-10-CM | POA: Diagnosis not present

## 2020-11-21 DIAGNOSIS — N183 Chronic kidney disease, stage 3 unspecified: Secondary | ICD-10-CM | POA: Diagnosis not present

## 2020-11-21 DIAGNOSIS — R159 Full incontinence of feces: Secondary | ICD-10-CM | POA: Diagnosis not present

## 2020-11-21 DIAGNOSIS — L719 Rosacea, unspecified: Secondary | ICD-10-CM | POA: Diagnosis not present

## 2020-11-21 DIAGNOSIS — F0281 Dementia in other diseases classified elsewhere with behavioral disturbance: Secondary | ICD-10-CM | POA: Diagnosis not present

## 2020-11-21 DIAGNOSIS — R32 Unspecified urinary incontinence: Secondary | ICD-10-CM | POA: Diagnosis not present

## 2020-11-21 DIAGNOSIS — G309 Alzheimer's disease, unspecified: Secondary | ICD-10-CM | POA: Diagnosis not present

## 2020-11-21 DIAGNOSIS — D631 Anemia in chronic kidney disease: Secondary | ICD-10-CM | POA: Diagnosis not present

## 2020-11-21 DIAGNOSIS — F418 Other specified anxiety disorders: Secondary | ICD-10-CM | POA: Diagnosis not present

## 2020-11-21 DIAGNOSIS — Z681 Body mass index (BMI) 19 or less, adult: Secondary | ICD-10-CM | POA: Diagnosis not present

## 2020-11-21 DIAGNOSIS — Z741 Need for assistance with personal care: Secondary | ICD-10-CM | POA: Diagnosis not present

## 2020-11-21 DIAGNOSIS — I4891 Unspecified atrial fibrillation: Secondary | ICD-10-CM | POA: Diagnosis not present

## 2020-11-21 DIAGNOSIS — Z7401 Bed confinement status: Secondary | ICD-10-CM | POA: Diagnosis not present

## 2020-11-21 DIAGNOSIS — Z87440 Personal history of urinary (tract) infections: Secondary | ICD-10-CM | POA: Diagnosis not present

## 2020-11-21 DIAGNOSIS — H04123 Dry eye syndrome of bilateral lacrimal glands: Secondary | ICD-10-CM | POA: Diagnosis not present

## 2020-11-21 DIAGNOSIS — E222 Syndrome of inappropriate secretion of antidiuretic hormone: Secondary | ICD-10-CM | POA: Diagnosis not present

## 2020-11-21 DIAGNOSIS — R55 Syncope and collapse: Secondary | ICD-10-CM | POA: Diagnosis not present

## 2020-11-24 DIAGNOSIS — E222 Syndrome of inappropriate secretion of antidiuretic hormone: Secondary | ICD-10-CM | POA: Diagnosis not present

## 2020-11-24 DIAGNOSIS — R634 Abnormal weight loss: Secondary | ICD-10-CM | POA: Diagnosis not present

## 2020-11-24 DIAGNOSIS — I4891 Unspecified atrial fibrillation: Secondary | ICD-10-CM | POA: Diagnosis not present

## 2020-11-24 DIAGNOSIS — Z87440 Personal history of urinary (tract) infections: Secondary | ICD-10-CM | POA: Diagnosis not present

## 2020-11-24 DIAGNOSIS — F0281 Dementia in other diseases classified elsewhere with behavioral disturbance: Secondary | ICD-10-CM | POA: Diagnosis not present

## 2020-11-24 DIAGNOSIS — G309 Alzheimer's disease, unspecified: Secondary | ICD-10-CM | POA: Diagnosis not present

## 2020-11-30 DIAGNOSIS — F0281 Dementia in other diseases classified elsewhere with behavioral disturbance: Secondary | ICD-10-CM | POA: Diagnosis not present

## 2020-11-30 DIAGNOSIS — I4891 Unspecified atrial fibrillation: Secondary | ICD-10-CM | POA: Diagnosis not present

## 2020-11-30 DIAGNOSIS — E222 Syndrome of inappropriate secretion of antidiuretic hormone: Secondary | ICD-10-CM | POA: Diagnosis not present

## 2020-11-30 DIAGNOSIS — Z87440 Personal history of urinary (tract) infections: Secondary | ICD-10-CM | POA: Diagnosis not present

## 2020-11-30 DIAGNOSIS — G309 Alzheimer's disease, unspecified: Secondary | ICD-10-CM | POA: Diagnosis not present

## 2020-11-30 DIAGNOSIS — R634 Abnormal weight loss: Secondary | ICD-10-CM | POA: Diagnosis not present

## 2020-12-01 ENCOUNTER — Encounter: Payer: Self-pay | Admitting: Internal Medicine

## 2020-12-01 ENCOUNTER — Non-Acute Institutional Stay (SKILLED_NURSING_FACILITY): Payer: Medicare Other | Admitting: Internal Medicine

## 2020-12-01 DIAGNOSIS — G301 Alzheimer's disease with late onset: Secondary | ICD-10-CM | POA: Diagnosis not present

## 2020-12-01 DIAGNOSIS — F0281 Dementia in other diseases classified elsewhere with behavioral disturbance: Secondary | ICD-10-CM | POA: Diagnosis not present

## 2020-12-01 DIAGNOSIS — R483 Visual agnosia: Secondary | ICD-10-CM | POA: Diagnosis not present

## 2020-12-01 DIAGNOSIS — F02818 Dementia in other diseases classified elsewhere, unspecified severity, with other behavioral disturbance: Secondary | ICD-10-CM

## 2020-12-01 NOTE — Progress Notes (Signed)
Location:  Occupational psychologist of Service:  SNF (31) Provider:  Kamryn Messineo L. Mariea Clonts, D.O., C.M.D.  [name], DO  Patient Care Team: [name], DO as PCP - General (Geriatric Medicine) Community, Well [name], MD as Consulting Physician (Gastroenterology) [name], [name], MD as Consulting Physician (Dermatology) [name], MD as Consulting Physician (Ophthalmology) Royal Hawthorn, NP as Nurse Practitioner (Nurse Practitioner)  Extended Emergency Contact Information Primary Emergency Contact: [facility] Address: [address]          [place], [place] [address] [name] of [place] Phone: [phone] Work Phone: [phone] Mobile Phone: [phone] Relation: Spouse  Code Status:  DNR, hospice Goals of care: Advanced Directive information Advanced Directives 07/03/2020  Does Patient Have a Medical Advance Directive? Yes  Type of Paramedic of [place];Living will  Does patient want to make changes to medical advance directive? -  Copy of [place] in Chart? Yes - validated most recent copy scanned in chart (See row information)  Would patient like information on creating a medical advance directive? -  Pre-existing out of facility DNR order (yellow form or pink MOST form) -     Chief Complaint  Patient presents with  . Acute Visit    Complains of Dementia Behaviors    HPI:  Pt is a 87 y.o. female seen today for an acute visit for increased dementia related behaviors today.  Radiance is in SNF with advanced dementia.  She was using racial slurs, calling people names, hitting, spitting and pinching CNAs, home care staff.  Her one caregiver left b/c of the racial slurs.  This behavior is totally out of the ordinary for [place] per her husband and other family.   Her behavior has been worse in the daytime, but improves by evening.  I was there b/w 5:30-6pm  and her CNA was able to very kindly and gently provide her pericare just after her husband arrived.  Unfortunately, things did not go smoothly around 4pm so they had to come back at this time to try again.  I spoke with Trip and we discussed the events and he also sent me a mychart message about this afterward.    Past Medical History:  Diagnosis Date  . Actinic keratoses   . Allergy   . Alzheimer disease ([place]) 05/21/2015  . Anxiety   . Arrhythmia    atrial fib  . Atrial fibrillation ([place]) 12/2012  . Basal cell carcinoma of skin   . Cataracts, both eyes 2005, 2013   Cataract extraction/IOL implant  . Current use of long term anticoagulation 12/2012   Eliquis  . Depression   . Diverticulosis 2008  . Fracture of scaphoid bone of hand 08/05/2004  . History of UTI   . Hyperlipidemia   . Hypertension   . Hyponatremia    SIADH  . Low sodium levels   . Mild cognitive impairment   . Osteopenia   . Rosacea   . Squamous cell carcinoma   . Thyroid nodule    Past Surgical History:  Procedure Laterality Date  . ABDOMINAL HYSTERECTOMY  1970   TAH-BSO  . cataracts Bilateral 2/05 and 3/05  . CESAREAN SECTION     questionable    Allergies  Allergen Reactions  . Aricept [Donepezil]   . Solifenacin Other (See Comments)  . Ciprofloxacin Other (See Comments)    Reaction unknown  . Latex Other (See Comments)    Reaction  unknown  . Macrobid [Nitrofurantoin Macrocrystal] Nausea And Vomiting  . Macrodantin [Nitrofurantoin] Nausea Only    Upset stomach  . Other Other (See Comments)    Popcorn- Diverticulosis  . Strawberry Extract Other (See Comments)    Diverticulosis   . Tramadol Nausea Only    Outpatient Encounter Medications as of 12/01/2020  Medication Sig  . hydrOXYzine (ATARAX/VISTARIL) 10 MG tablet Take 1 tablet (10 mg total) by mouth daily as needed for anxiety.  . lactose free nutrition (BOOST) LIQD Take 237 mLs by mouth daily.  . magnesium hydroxide (MILK OF MAGNESIA) 400  MG/5ML suspension Take 20 mLs by mouth daily as needed for mild constipation.  . melatonin 3 MG TABS tablet Take 3 mg by mouth at bedtime as needed.  . metoprolol succinate (TOPROL-XL) 25 MG 24 hr tablet Take 12.5 mg by mouth every other day.   . mineral oil-hydrophilic petrolatum (AQUAPHOR) ointment Apply topically 2 (two) times daily.  . mirtazapine (REMERON) 15 MG tablet Take 15 mg at bedtime by mouth.  . polyethylene glycol (MIRALAX / GLYCOLAX) packet 1/2 dose daily for constipation  . sodium chloride 1 G tablet Take 1 g by mouth daily.   Marland Kitchen tobramycin (TOBREX) 0.3 % ophthalmic solution Place 1 drop into the left eye in the morning, at noon, in the evening, and at bedtime.  Marland Kitchen trimethoprim (TRIMPEX) 100 MG tablet Take 100 mg by mouth daily.  Marland Kitchen UNABLE TO FIND Med Name: Preservative Free Tears every hour while awake in both eyes wait 3-5 min between drops. DO NOT wake resident for eye drops per MD  . [DISCONTINUED] acetaminophen (TYLENOL) 325 MG tablet Take 650 mg by mouth every 6 (six) hours as needed.   . [DISCONTINUED] saccharomyces boulardii (FLORASTOR) 250 MG capsule Take 250 mg by mouth daily.   No facility-administered encounter medications on file as of 12/01/2020.    Review of Systems  Constitutional: Negative for chills and fever.  HENT: Negative for congestion.   Eyes: Positive for blurred vision.       Seems to have more visual deficits than previously per her husband  Respiratory: Negative for cough and shortness of breath.   Cardiovascular: Negative for chest pain, palpitations and leg swelling.  Gastrointestinal: Negative for abdominal pain and constipation.  Genitourinary: Negative for dysuria.  Musculoskeletal: Negative for falls and joint pain.  Skin: Negative for itching and rash.  Neurological: Positive for weakness. Negative for dizziness and loss of consciousness.  Endo/Heme/Allergies: Bruises/bleeds easily.  Psychiatric/Behavioral: Positive for memory loss. Negative  for depression. The patient is nervous/anxious. The patient does not have insomnia.     Immunization History  Administered Date(s) Administered  . H1N1 12/04/2008  . Influenza, High Dose Seasonal PF 09/19/2019  . Influenza,inj,Quad PF,6+ Mos 09/11/2018  . Influenza-Unspecified 09/15/2003, 11/01/2010, 09/12/2011, 07/22/2016, 09/11/2017, 07/23/2020  . Moderna Sars-Covid-2 Vaccination 11/26/2019, 12/24/2019  . Pneumococcal Conjugate-13 04/14/2017  . Pneumococcal Polysaccharide-23 12/19/2008  . Td 09/15/2003, 01/06/2016  . Zoster Recombinat (Shingrix) 08/21/2017, 11/21/2017   Pertinent  Health Maintenance Due  Topic Date Due  . INFLUENZA VACCINE  Completed  . DEXA SCAN  Completed  . PNA vac Low Risk Adult  Completed   Fall Risk  08/15/2018 07/12/2017 06/21/2017 05/04/2017 03/08/2017  Falls in the past year? No No No No No   Functional Status Survey:    Vitals:   12/01/20 1633  BP: (!) 100/55  Pulse: 80  Temp: (!) 97.3 F (36.3 C)  TempSrc: Oral  SpO2: 96%  Weight:  108 lb 3.2 oz (49.1 kg)  Height: '5\' 3"'  (1.6 m)   Body mass index is 19.17 kg/m. Physical Exam Vitals reviewed.  Constitutional:      General: She is not in acute distress. Eyes:     Conjunctiva/sclera: Conjunctivae normal.     Pupils: Pupils are equal, round, and reactive to light.     Comments: Does not appear to see well even directly in front of her at this point  Cardiovascular:     Rate and Rhythm: Rhythm irregular.     Heart sounds: No murmur heard.   Pulmonary:     Effort: Pulmonary effort is normal.     Breath sounds: Normal breath sounds. No wheezing, rhonchi or rales.  Abdominal:     General: Bowel sounds are normal.     Tenderness: There is no abdominal tenderness.  Lymphadenopathy:     Cervical: No cervical adenopathy.  Neurological:     General: No focal deficit present.     Mental Status: She is alert.  Psychiatric:     Comments: Currently calm and allowing care with simple instructions,  gentle coaxing, staying in her vision and always keeping her informed what's happening next     Labs reviewed: Recent Labs    05/11/20 0000  NA 139  K 4.5  CL 106  CO2 22  BUN 24*  CREATININE 0.9  CALCIUM 9.7   No results for input(s): AST, ALT, ALKPHOS, BILITOT, PROT, ALBUMIN in the last 8760 hours. Recent Labs    05/11/20 0000  WBC 5.2  HGB 11.8*  HCT 36  PLT 203   Lab Results  Component Value Date   TSH 2.29 10/18/2018   No results found for: HGBA1C No results found for: CHOL, HDL, LDLCALC, LDLDIRECT, TRIG, CHOLHDL  Significant Diagnostic Results in last 30 days:  No results found.  Assessment/Plan 1. Late onset Alzheimer's disease with behavioral disturbance ([place]) Behaviors on day shift with care Better by evening and when husband present Did not permit care at 4pm but was ok by 5:30pm when I saw her Continue to return later if she does not permit care Provide simple instructions, gentle coaxing, staying in her vision and always keeping her informed what's happening next  2. Visual agnosia -cannot come at her from outside of her minimal visual field and need to keep her aware of what's going on at all times  Family/ staff Communication: met with Trip outside of room  Labs/tests ordered:  No new  Jonie Burdell L. Greenlee Ancheta, D.O. [facility] [address], [place] [address] Cell Phone (Mon-Fri 8am-5pm):  [phone] On Call:  [phone] & follow prompts after 5pm & weekends Office Phone:  [phone] Office Fax:  [phone]

## 2020-12-03 ENCOUNTER — Encounter: Payer: Self-pay | Admitting: Internal Medicine

## 2020-12-09 DIAGNOSIS — F0281 Dementia in other diseases classified elsewhere with behavioral disturbance: Secondary | ICD-10-CM | POA: Diagnosis not present

## 2020-12-09 DIAGNOSIS — G309 Alzheimer's disease, unspecified: Secondary | ICD-10-CM | POA: Diagnosis not present

## 2020-12-09 DIAGNOSIS — I4891 Unspecified atrial fibrillation: Secondary | ICD-10-CM | POA: Diagnosis not present

## 2020-12-09 DIAGNOSIS — R634 Abnormal weight loss: Secondary | ICD-10-CM | POA: Diagnosis not present

## 2020-12-09 DIAGNOSIS — Z87440 Personal history of urinary (tract) infections: Secondary | ICD-10-CM | POA: Diagnosis not present

## 2020-12-09 DIAGNOSIS — E222 Syndrome of inappropriate secretion of antidiuretic hormone: Secondary | ICD-10-CM | POA: Diagnosis not present

## 2020-12-11 DIAGNOSIS — F0281 Dementia in other diseases classified elsewhere with behavioral disturbance: Secondary | ICD-10-CM | POA: Diagnosis not present

## 2020-12-11 DIAGNOSIS — G309 Alzheimer's disease, unspecified: Secondary | ICD-10-CM | POA: Diagnosis not present

## 2020-12-11 DIAGNOSIS — I4891 Unspecified atrial fibrillation: Secondary | ICD-10-CM | POA: Diagnosis not present

## 2020-12-11 DIAGNOSIS — R634 Abnormal weight loss: Secondary | ICD-10-CM | POA: Diagnosis not present

## 2020-12-11 DIAGNOSIS — E222 Syndrome of inappropriate secretion of antidiuretic hormone: Secondary | ICD-10-CM | POA: Diagnosis not present

## 2020-12-11 DIAGNOSIS — Z87440 Personal history of urinary (tract) infections: Secondary | ICD-10-CM | POA: Diagnosis not present

## 2020-12-18 DIAGNOSIS — Z87440 Personal history of urinary (tract) infections: Secondary | ICD-10-CM | POA: Diagnosis not present

## 2020-12-18 DIAGNOSIS — R634 Abnormal weight loss: Secondary | ICD-10-CM | POA: Diagnosis not present

## 2020-12-18 DIAGNOSIS — E222 Syndrome of inappropriate secretion of antidiuretic hormone: Secondary | ICD-10-CM | POA: Diagnosis not present

## 2020-12-18 DIAGNOSIS — G309 Alzheimer's disease, unspecified: Secondary | ICD-10-CM | POA: Diagnosis not present

## 2020-12-18 DIAGNOSIS — F0281 Dementia in other diseases classified elsewhere with behavioral disturbance: Secondary | ICD-10-CM | POA: Diagnosis not present

## 2020-12-18 DIAGNOSIS — I4891 Unspecified atrial fibrillation: Secondary | ICD-10-CM | POA: Diagnosis not present

## 2020-12-21 ENCOUNTER — Non-Acute Institutional Stay (SKILLED_NURSING_FACILITY): Payer: Medicare Other | Admitting: Adult Health

## 2020-12-21 DIAGNOSIS — L853 Xerosis cutis: Secondary | ICD-10-CM | POA: Diagnosis not present

## 2020-12-21 DIAGNOSIS — K5901 Slow transit constipation: Secondary | ICD-10-CM

## 2020-12-22 ENCOUNTER — Encounter: Payer: Self-pay | Admitting: Adult Health

## 2020-12-22 DIAGNOSIS — E222 Syndrome of inappropriate secretion of antidiuretic hormone: Secondary | ICD-10-CM | POA: Diagnosis not present

## 2020-12-22 DIAGNOSIS — I4891 Unspecified atrial fibrillation: Secondary | ICD-10-CM | POA: Diagnosis not present

## 2020-12-22 DIAGNOSIS — R55 Syncope and collapse: Secondary | ICD-10-CM | POA: Diagnosis not present

## 2020-12-22 DIAGNOSIS — Z7401 Bed confinement status: Secondary | ICD-10-CM | POA: Diagnosis not present

## 2020-12-22 DIAGNOSIS — Z741 Need for assistance with personal care: Secondary | ICD-10-CM | POA: Diagnosis not present

## 2020-12-22 DIAGNOSIS — G309 Alzheimer's disease, unspecified: Secondary | ICD-10-CM | POA: Diagnosis not present

## 2020-12-22 DIAGNOSIS — R32 Unspecified urinary incontinence: Secondary | ICD-10-CM | POA: Diagnosis not present

## 2020-12-22 DIAGNOSIS — H16129 Filamentary keratitis, unspecified eye: Secondary | ICD-10-CM

## 2020-12-22 DIAGNOSIS — F418 Other specified anxiety disorders: Secondary | ICD-10-CM | POA: Diagnosis not present

## 2020-12-22 DIAGNOSIS — N183 Chronic kidney disease, stage 3 unspecified: Secondary | ICD-10-CM | POA: Diagnosis not present

## 2020-12-22 DIAGNOSIS — Z87440 Personal history of urinary (tract) infections: Secondary | ICD-10-CM | POA: Diagnosis not present

## 2020-12-22 DIAGNOSIS — L719 Rosacea, unspecified: Secondary | ICD-10-CM | POA: Diagnosis not present

## 2020-12-22 DIAGNOSIS — F0281 Dementia in other diseases classified elsewhere with behavioral disturbance: Secondary | ICD-10-CM | POA: Diagnosis not present

## 2020-12-22 DIAGNOSIS — R634 Abnormal weight loss: Secondary | ICD-10-CM | POA: Diagnosis not present

## 2020-12-22 DIAGNOSIS — R159 Full incontinence of feces: Secondary | ICD-10-CM | POA: Diagnosis not present

## 2020-12-22 DIAGNOSIS — H04123 Dry eye syndrome of bilateral lacrimal glands: Secondary | ICD-10-CM | POA: Diagnosis not present

## 2020-12-22 DIAGNOSIS — Z681 Body mass index (BMI) 19 or less, adult: Secondary | ICD-10-CM | POA: Diagnosis not present

## 2020-12-22 DIAGNOSIS — D631 Anemia in chronic kidney disease: Secondary | ICD-10-CM | POA: Diagnosis not present

## 2020-12-22 HISTORY — DX: Filamentary keratitis, unspecified eye: H16.129

## 2020-12-22 NOTE — Progress Notes (Signed)
Location:  Occupational psychologist of Service:  SNF (31) Provider:   [name], ANP [place] [phone]   [name], DO  Patient Care Team: [name], DO as PCP - General (Geriatric Medicine) Community, Well [name], MD as Consulting Physician (Gastroenterology) [name], [name], MD as Consulting Physician (Dermatology) [name], MD as Consulting Physician (Ophthalmology) Royal Hawthorn, NP as Nurse Practitioner (Nurse Practitioner)  Extended Emergency Contact Information Primary Emergency Contact: [facility] Address: [address]          [place], [place] [address] [name] of [place] Phone: [phone] Work Phone: [phone] Mobile Phone: [phone] Relation: Spouse  Code Status:  DNR Goals of care: Advanced Directive information Advanced Directives 07/03/2020  Does Patient Have a Medical Advance Directive? Yes  Type of Paramedic of [place];Living will  Does patient want to make changes to medical advance directive? -  Copy of [place] in Chart? Yes - validated most recent copy scanned in chart (See row information)  Would patient like information on creating a medical advance directive? -  Pre-existing out of facility DNR order (yellow form or pink MOST form) -     Chief Complaint  Patient presents with  . Acute Visit    Constipation and left eye skin dry     HPI:  Pt is a 87 y.o. female seen today for an acute visit for constipation and to assess her left for dryness.  Ms. Gootee has advancing dementia and resides in skilled care.  Her nurse reports that she sometimes has difficulty having bowel movements and intermittently requires suppositories.  They are asking for additional medication to keep her regular.  At times the nurses have difficulty getting her to swallow her medications but the nurse reports the  MiraLAX is given and fluid and she consumes it.  There is also an area for me to examine around the outer left which is dry and slightly red but there is no drainage.  She has a history of keratitis and is on eyedrops but sometimes the nurse are not able to get them in her due to combative related behaviors.   Past Medical History:  Diagnosis Date  . Actinic keratoses   . Allergy   . Alzheimer disease ([place]) 05/21/2015  . Anxiety   . Arrhythmia    atrial fib  . Atrial fibrillation ([place]) 12/2012  . Basal cell carcinoma of skin   . Cataracts, both eyes 2005, 2013   Cataract extraction/IOL implant  . Current use of long term anticoagulation 12/2012   Eliquis  . Depression   . Diverticulosis 2008  . Fracture of scaphoid bone of hand 08/05/2004  . History of UTI   . Hyperlipidemia   . Hypertension   . Hyponatremia    SIADH  . Low sodium levels   . Mild cognitive impairment   . Osteopenia   . Rosacea   . Squamous cell carcinoma   . Thyroid nodule    Past Surgical History:  Procedure Laterality Date  . ABDOMINAL HYSTERECTOMY  1970   TAH-BSO  . cataracts Bilateral 2/05 and 3/05  . CESAREAN SECTION     questionable    Allergies  Allergen Reactions  . Aricept [Donepezil]   . Solifenacin Other (See Comments)  . Ciprofloxacin Other (See Comments)    Reaction unknown  . Latex Other (See Comments)    Reaction unknown  . Macrobid [employer]  Macrocrystal] Nausea And Vomiting  . Macrodantin [Nitrofurantoin] Nausea Only    Upset stomach  . Other Other (See Comments)    Popcorn- Diverticulosis  . Strawberry Extract Other (See Comments)    Diverticulosis   . Tramadol Nausea Only    Outpatient Encounter Medications as of 12/21/2020  Medication Sig  . divalproex (DEPAKOTE SPRINKLE) 125 MG capsule Take 250 mg by mouth in the morning.  . lactose free nutrition (BOOST) LIQD Take 237 mLs by mouth daily.  . magnesium hydroxide (MILK OF MAGNESIA) 400 MG/5ML suspension Take 20 mLs  by mouth daily as needed for mild constipation.  . melatonin 3 MG TABS tablet Take 3 mg by mouth at bedtime as needed.  . metoprolol succinate (TOPROL-XL) 25 MG 24 hr tablet Take 12.5 mg by mouth every other day.   . mineral oil-hydrophilic petrolatum (AQUAPHOR) ointment Apply topically 2 (two) times daily. Apply to left lower eye area qhs  . mirtazapine (REMERON) 15 MG tablet Take 15 mg at bedtime by mouth.  . polyethylene glycol (MIRALAX / GLYCOLAX) packet 2 (two) times daily. Full cap full each morning and 1/2 cap full each evening.  . sodium chloride 1 G tablet Take 1 g by mouth daily.   Marland Kitchen tobramycin (TOBREX) 0.3 % ophthalmic solution Place 1 drop into the left eye in the morning, at noon, in the evening, and at bedtime.  Marland Kitchen trimethoprim (TRIMPEX) 100 MG tablet Take 100 mg by mouth daily.  Marland Kitchen UNABLE TO FIND Med Name: Preservative Free Tears every hour while awake in both eyes wait 3-5 min between drops. DO NOT wake resident for eye drops per MD  . [DISCONTINUED] hydrOXYzine (ATARAX/VISTARIL) 10 MG tablet Take 1 tablet (10 mg total) by mouth daily as needed for anxiety.   No facility-administered encounter medications on file as of 12/21/2020.    Review of Systems  Unable to perform ROS: Dementia    Immunization History  Administered Date(s) Administered  . H1N1 12/04/2008  . Influenza, High Dose Seasonal PF 09/19/2019  . Influenza,inj,Quad PF,6+ Mos 09/11/2018  . Influenza-Unspecified 09/15/2003, 11/01/2010, 09/12/2011, 07/22/2016, 09/11/2017, 07/23/2020  . Moderna Sars-Covid-2 Vaccination 11/26/2019, 12/24/2019  . Pneumococcal Conjugate-13 04/14/2017  . Pneumococcal Polysaccharide-23 12/19/2008  . Td 09/15/2003, 01/06/2016  . Zoster Recombinat (Shingrix) 08/21/2017, 11/21/2017   Pertinent  Health Maintenance Due  Topic Date Due  . INFLUENZA VACCINE  Completed  . DEXA SCAN  Completed  . PNA vac Low Risk Adult  Completed   Fall Risk  08/15/2018 07/12/2017 06/21/2017 05/04/2017  03/08/2017  Falls in the past year? No No No No No   Functional Status Survey:    There were no vitals filed for this visit. There is no height or weight on file to calculate BMI. Physical Exam Vitals and nursing note reviewed.  Constitutional:      General: She is not in acute distress.    Appearance: She is not diaphoretic.  HENT:     Head: Normocephalic and atraumatic.  Eyes:     General:        Right eye: No discharge.        Left eye: No discharge.     Conjunctiva/sclera: Conjunctivae normal.  Neck:     Vascular: No JVD.  Cardiovascular:     Rate and Rhythm: Normal rate. Rhythm irregular.     Heart sounds: No murmur heard.   Pulmonary:     Effort: Pulmonary effort is normal. No respiratory distress.     Breath sounds: Normal  breath sounds. No wheezing.  Abdominal:     General: Bowel sounds are normal. There is no distension.     Palpations: Abdomen is soft.     Tenderness: There is no abdominal tenderness.  Skin:    General: Skin is warm and dry.     Comments: Dry scaly skin around the left lower lid and left outer corner of the eye  Neurological:     General: No focal deficit present.     Mental Status: She is alert. Mental status is at baseline.     Labs reviewed: Recent Labs    05/11/20 0000  NA 139  K 4.5  CL 106  CO2 22  BUN 24*  CREATININE 0.9  CALCIUM 9.7   No results for input(s): AST, ALT, ALKPHOS, BILITOT, PROT, ALBUMIN in the last 8760 hours. Recent Labs    05/11/20 0000  WBC 5.2  HGB 11.8*  HCT 36  PLT 203   Lab Results  Component Value Date   TSH 2.29 10/18/2018   No results found for: HGBA1C No results found for: CHOL, HDL, LDLCALC, LDLDIRECT, TRIG, CHOLHDL  Significant Diagnostic Results in last 30 days:  No results found.  Assessment/Plan  1. Xerosis of skin Apply aquaphor to lower lid and outer lid of left eye qhs  2. Slow transit constipation Increase miralax to 1 full cap full each morning and 1/2 cap ful each  evening.    Labs/tests ordered: NA

## 2020-12-23 DIAGNOSIS — E222 Syndrome of inappropriate secretion of antidiuretic hormone: Secondary | ICD-10-CM | POA: Diagnosis not present

## 2020-12-23 DIAGNOSIS — I4891 Unspecified atrial fibrillation: Secondary | ICD-10-CM | POA: Diagnosis not present

## 2020-12-23 DIAGNOSIS — G309 Alzheimer's disease, unspecified: Secondary | ICD-10-CM | POA: Diagnosis not present

## 2020-12-23 DIAGNOSIS — F0281 Dementia in other diseases classified elsewhere with behavioral disturbance: Secondary | ICD-10-CM | POA: Diagnosis not present

## 2020-12-23 DIAGNOSIS — Z87440 Personal history of urinary (tract) infections: Secondary | ICD-10-CM | POA: Diagnosis not present

## 2020-12-23 DIAGNOSIS — R634 Abnormal weight loss: Secondary | ICD-10-CM | POA: Diagnosis not present

## 2020-12-24 DIAGNOSIS — R634 Abnormal weight loss: Secondary | ICD-10-CM | POA: Diagnosis not present

## 2020-12-24 DIAGNOSIS — G309 Alzheimer's disease, unspecified: Secondary | ICD-10-CM | POA: Diagnosis not present

## 2020-12-24 DIAGNOSIS — F0281 Dementia in other diseases classified elsewhere with behavioral disturbance: Secondary | ICD-10-CM | POA: Diagnosis not present

## 2020-12-24 DIAGNOSIS — I4891 Unspecified atrial fibrillation: Secondary | ICD-10-CM | POA: Diagnosis not present

## 2020-12-24 DIAGNOSIS — E222 Syndrome of inappropriate secretion of antidiuretic hormone: Secondary | ICD-10-CM | POA: Diagnosis not present

## 2020-12-24 DIAGNOSIS — Z87440 Personal history of urinary (tract) infections: Secondary | ICD-10-CM | POA: Diagnosis not present

## 2021-01-03 DIAGNOSIS — E222 Syndrome of inappropriate secretion of antidiuretic hormone: Secondary | ICD-10-CM | POA: Diagnosis not present

## 2021-01-03 DIAGNOSIS — Z87440 Personal history of urinary (tract) infections: Secondary | ICD-10-CM | POA: Diagnosis not present

## 2021-01-03 DIAGNOSIS — G309 Alzheimer's disease, unspecified: Secondary | ICD-10-CM | POA: Diagnosis not present

## 2021-01-03 DIAGNOSIS — R634 Abnormal weight loss: Secondary | ICD-10-CM | POA: Diagnosis not present

## 2021-01-03 DIAGNOSIS — I4891 Unspecified atrial fibrillation: Secondary | ICD-10-CM | POA: Diagnosis not present

## 2021-01-03 DIAGNOSIS — F0281 Dementia in other diseases classified elsewhere with behavioral disturbance: Secondary | ICD-10-CM | POA: Diagnosis not present

## 2021-01-04 ENCOUNTER — Encounter: Payer: Self-pay | Admitting: Adult Health

## 2021-01-04 ENCOUNTER — Non-Acute Institutional Stay (SKILLED_NURSING_FACILITY): Payer: Medicare Other | Admitting: Adult Health

## 2021-01-04 DIAGNOSIS — F321 Major depressive disorder, single episode, moderate: Secondary | ICD-10-CM

## 2021-01-04 DIAGNOSIS — I480 Paroxysmal atrial fibrillation: Secondary | ICD-10-CM

## 2021-01-04 DIAGNOSIS — K5901 Slow transit constipation: Secondary | ICD-10-CM | POA: Diagnosis not present

## 2021-01-04 DIAGNOSIS — G301 Alzheimer's disease with late onset: Secondary | ICD-10-CM | POA: Diagnosis not present

## 2021-01-04 DIAGNOSIS — F0281 Dementia in other diseases classified elsewhere with behavioral disturbance: Secondary | ICD-10-CM

## 2021-01-04 DIAGNOSIS — F02818 Dementia in other diseases classified elsewhere, unspecified severity, with other behavioral disturbance: Secondary | ICD-10-CM

## 2021-01-04 DIAGNOSIS — Z87440 Personal history of urinary (tract) infections: Secondary | ICD-10-CM

## 2021-01-04 NOTE — Progress Notes (Signed)
Location:  Occupational psychologist of Service:  SNF (31) Provider:   [name], ANP [place] [phone]   [name], DO  Patient Care Team: [name], DO as PCP - General (Geriatric Medicine) Community, Well [name], MD as Consulting Physician (Gastroenterology) [name], [name], MD as Consulting Physician (Dermatology) [name], MD as Consulting Physician (Ophthalmology) Royal Hawthorn, NP as Nurse Practitioner (Nurse Practitioner)  Extended Emergency Contact Information Primary Emergency Contact: [facility] Address: [address]          [place], [place] [address] [name] of [place] Phone: [phone] Work Phone: [phone] Mobile Phone: [phone] Relation: Spouse  Code Status:  DNR Goals of care: Advanced Directive information Advanced Directives 07/03/2020  Does Patient Have a Medical Advance Directive? Yes  Type of Paramedic of [place];Living will  Does patient want to make changes to medical advance directive? -  Copy of [place] in Chart? Yes - validated most recent copy scanned in chart (See row information)  Would patient like information on creating a medical advance directive? -  Pre-existing out of facility DNR order (yellow form or pink MOST form) -     Chief Complaint  Patient presents with  . Medical Management of Chronic Issues    HPI:  Pt is a 87 y.o. female seen today for medical management of chronic diseases. PMH significant for AD. She moved to skilled care in Dec of 2021 after residing in memory care. She has had some behaviors of concern which include verbal and physical aggression during personal care. She was started on depakote for mood stabilization 12/10/20.  The nurse reports this has helped with less issues particularly with verbal comments. No s/e thus far. Pt is followed by hospice due  to dementia. Weight remains stable at 108 lbs.   Miralax was increased to twice daily and the nurse reports this has helped with BMs.  Afib: rate controlled on metoprolol 12.5 mg qod. No longer on eliquis for CVA risk reduction due to lack of benefit.    Noted hx of filamentary keratitis with no acute complaints.   No recent UTIs, continues on trimethoprm Past Medical History:  Diagnosis Date  . Actinic keratoses   . Allergy   . Alzheimer disease ([place]) 05/21/2015  . Anxiety   . Arrhythmia    atrial fib  . Atrial fibrillation ([place]) 12/2012  . Basal cell carcinoma of skin   . Cataracts, both eyes 2005, 2013   Cataract extraction/IOL implant  . Current use of long term anticoagulation 12/2012   Eliquis  . Depression   . Diverticulosis 2008  . 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 Fracture of scaphoid bone of hand 08/05/2004  . History of UTI   . Hyperlipidemia   . Hypertension   . Hyponatremia    SIADH  . Low sodium levels   . Mild cognitive impairment   . Osteopenia   . Rosacea   . Squamous cell carcinoma   . Thyroid nodule    Past Surgical History:  Procedure Laterality Date  . ABDOMINAL HYSTERECTOMY  1970   TAH-BSO  . cataracts Bilateral 2/05 and 3/05  . CESAREAN SECTION     questionable    Allergies  Allergen Reactions  . Aricept [Donepezil]   . Solifenacin Other (See Comments)  . Ciprofloxacin Other (See Comments)    Reaction unknown  . Latex Other (See Comments)    Reaction  unknown  . Macrobid [Nitrofurantoin Macrocrystal] Nausea And Vomiting  . Macrodantin [Nitrofurantoin] Nausea Only    Upset stomach  . Other Other (See Comments)    Popcorn- Diverticulosis  . Strawberry Extract Other (See Comments)    Diverticulosis   . Tramadol Nausea Only    Outpatient Encounter Medications as of 01/04/2021  Medication Sig  . divalproex (DEPAKOTE SPRINKLE) 125 MG capsule Take 250 mg by mouth in the morning.  . lactose free nutrition (BOOST) LIQD Take 237 mLs by  mouth daily.  . magnesium hydroxide (MILK OF MAGNESIA) 400 MG/5ML suspension Take 20 mLs by mouth daily as needed for mild constipation.  . melatonin 3 MG TABS tablet Take 3 mg by mouth at bedtime as needed.  . metoprolol succinate (TOPROL-XL) 25 MG 24 hr tablet Take 12.5 mg by mouth every other day.   . mineral oil-hydrophilic petrolatum (AQUAPHOR) ointment Apply topically 2 (two) times daily. Apply to left lower eye area qhs  . mirtazapine (REMERON) 15 MG tablet Take 15 mg at bedtime by mouth.  . tobramycin (TOBREX) 0.3 % ophthalmic solution Place 1 drop into the left eye in the morning, at noon, in the evening, and at bedtime.  Marland Kitchen trimethoprim (TRIMPEX) 100 MG tablet Take 100 mg by mouth daily.  Marland Kitchen UNABLE TO FIND Med Name: Preservative Free Tears every hour while awake in both eyes wait 3-5 min between drops. DO NOT wake resident for eye drops per MD  . polyethylene glycol (MIRALAX / GLYCOLAX) packet 2 (two) times daily. Full cap full each morning and 1/2 cap full each evening.  . sodium chloride 1 G tablet Take 1 g by mouth daily.    No facility-administered encounter medications on file as of 01/04/2021.    Review of Systems  Unable to perform ROS: Dementia    Immunization History  Administered Date(s) Administered  . H1N1 12/04/2008  . Influenza, High Dose Seasonal PF 09/19/2019  . Influenza,inj,Quad PF,6+ Mos 09/11/2018  . Influenza-Unspecified 09/15/2003, 11/01/2010, 09/12/2011, 07/22/2016, 09/11/2017, 07/23/2020  . Moderna Sars-Covid-2 Vaccination 11/26/2019, 12/24/2019  . Pneumococcal Conjugate-13 04/14/2017  . Pneumococcal Polysaccharide-23 12/19/2008  . Td 09/15/2003, 01/06/2016  . Zoster Recombinat (Shingrix) 08/21/2017, 11/21/2017   Pertinent  Health Maintenance Due  Topic Date Due  . INFLUENZA VACCINE  Completed  . DEXA SCAN  Completed  . PNA vac Low Risk Adult  Completed   Fall Risk  08/15/2018 07/12/2017 06/21/2017 05/04/2017 03/08/2017  Falls in the past year? No No No  No No   Functional Status Survey:    Vitals:   01/04/21 1018  Weight: 108 lb 3.2 oz (49.1 kg)   Body mass index is 19.17 kg/m.  Wt Readings from Last 3 Encounters:  01/04/21 108 lb 3.2 oz (49.1 kg)  12/01/20 108 lb 3.2 oz (49.1 kg)  08/27/20 108 lb 12.8 oz (49.4 kg)    Physical Exam Vitals and nursing note reviewed.  Constitutional:      General: She is not in acute distress.    Appearance: She is not diaphoretic.  HENT:     Head: Normocephalic and atraumatic.  Neck:     Vascular: No JVD.  Cardiovascular:     Rate and Rhythm: Normal rate. Rhythm irregular.     Heart sounds: No murmur heard.   Pulmonary:     Effort: Pulmonary effort is normal. No respiratory distress.     Breath sounds: Normal breath sounds. No wheezing.  Abdominal:     General: Bowel sounds are normal. There  is no distension.     Palpations: Abdomen is soft.     Tenderness: There is no abdominal tenderness.  Musculoskeletal:     Cervical back: No rigidity or tenderness.     Right lower leg: No edema.     Left lower leg: No edema.  Lymphadenopathy:     Cervical: No cervical adenopathy.  Skin:    General: Skin is warm and dry.  Neurological:     General: No focal deficit present.     Mental Status: She is alert. Mental status is at baseline.  Psychiatric:     Comments: Slightly anxious.      Labs reviewed: Recent Labs    05/11/20 0000  NA 139  K 4.5  CL 106  CO2 22  BUN 24*  CREATININE 0.9  CALCIUM 9.7   No results for input(s): AST, ALT, ALKPHOS, BILITOT, PROT, ALBUMIN in the last 8760 hours. Recent Labs    05/11/20 0000  WBC 5.2  HGB 11.8*  HCT 36  PLT 203   Lab Results  Component Value Date   TSH 2.29 10/18/2018   No results found for: HGBA1C No results found for: CHOL, HDL, LDLCALC, LDLDIRECT, TRIG, CHOLHDL  Significant Diagnostic Results in last 30 days:  No results found.  Assessment/Plan  1. Late onset Alzheimer's disease with behavioral disturbance  (HCC) Severe stage, followed by hospice Continue Depakote 250 mg qhs for mood stabilization   2. Paroxysmal atrial fibrillation (HCC) Rate controlled Continue Toprol XL 12.5 mg qod  3. Slow transit constipation Improved Continue miralax full cap in am and 1/2 cap in pm (hold for loose stools)  4. Depression, major, single episode, moderate (HCC) Continue Remeron 15 mg qhs   5. History of recurrent UTIs No current issues Continue trimethoprim 100 mg qd     Labs/tests ordered: NA

## 2021-01-05 DIAGNOSIS — R634 Abnormal weight loss: Secondary | ICD-10-CM | POA: Diagnosis not present

## 2021-01-05 DIAGNOSIS — Z87440 Personal history of urinary (tract) infections: Secondary | ICD-10-CM | POA: Diagnosis not present

## 2021-01-05 DIAGNOSIS — G309 Alzheimer's disease, unspecified: Secondary | ICD-10-CM | POA: Diagnosis not present

## 2021-01-05 DIAGNOSIS — I4891 Unspecified atrial fibrillation: Secondary | ICD-10-CM | POA: Diagnosis not present

## 2021-01-05 DIAGNOSIS — F0281 Dementia in other diseases classified elsewhere with behavioral disturbance: Secondary | ICD-10-CM | POA: Diagnosis not present

## 2021-01-05 DIAGNOSIS — E222 Syndrome of inappropriate secretion of antidiuretic hormone: Secondary | ICD-10-CM | POA: Diagnosis not present

## 2021-01-11 ENCOUNTER — Encounter: Payer: Self-pay | Admitting: Internal Medicine

## 2021-01-12 DIAGNOSIS — Z87440 Personal history of urinary (tract) infections: Secondary | ICD-10-CM | POA: Diagnosis not present

## 2021-01-12 DIAGNOSIS — G309 Alzheimer's disease, unspecified: Secondary | ICD-10-CM | POA: Diagnosis not present

## 2021-01-12 DIAGNOSIS — E222 Syndrome of inappropriate secretion of antidiuretic hormone: Secondary | ICD-10-CM | POA: Diagnosis not present

## 2021-01-12 DIAGNOSIS — R634 Abnormal weight loss: Secondary | ICD-10-CM | POA: Diagnosis not present

## 2021-01-12 DIAGNOSIS — I4891 Unspecified atrial fibrillation: Secondary | ICD-10-CM | POA: Diagnosis not present

## 2021-01-12 DIAGNOSIS — F0281 Dementia in other diseases classified elsewhere with behavioral disturbance: Secondary | ICD-10-CM | POA: Diagnosis not present

## 2021-01-14 DIAGNOSIS — Z87440 Personal history of urinary (tract) infections: Secondary | ICD-10-CM | POA: Diagnosis not present

## 2021-01-14 DIAGNOSIS — I4891 Unspecified atrial fibrillation: Secondary | ICD-10-CM | POA: Diagnosis not present

## 2021-01-14 DIAGNOSIS — R634 Abnormal weight loss: Secondary | ICD-10-CM | POA: Diagnosis not present

## 2021-01-14 DIAGNOSIS — G309 Alzheimer's disease, unspecified: Secondary | ICD-10-CM | POA: Diagnosis not present

## 2021-01-14 DIAGNOSIS — E222 Syndrome of inappropriate secretion of antidiuretic hormone: Secondary | ICD-10-CM | POA: Diagnosis not present

## 2021-01-14 DIAGNOSIS — F0281 Dementia in other diseases classified elsewhere with behavioral disturbance: Secondary | ICD-10-CM | POA: Diagnosis not present

## 2021-01-19 DIAGNOSIS — R159 Full incontinence of feces: Secondary | ICD-10-CM | POA: Diagnosis not present

## 2021-01-19 DIAGNOSIS — Z681 Body mass index (BMI) 19 or less, adult: Secondary | ICD-10-CM | POA: Diagnosis not present

## 2021-01-19 DIAGNOSIS — I4891 Unspecified atrial fibrillation: Secondary | ICD-10-CM | POA: Diagnosis not present

## 2021-01-19 DIAGNOSIS — F0281 Dementia in other diseases classified elsewhere with behavioral disturbance: Secondary | ICD-10-CM | POA: Diagnosis not present

## 2021-01-19 DIAGNOSIS — R634 Abnormal weight loss: Secondary | ICD-10-CM | POA: Diagnosis not present

## 2021-01-19 DIAGNOSIS — N183 Chronic kidney disease, stage 3 unspecified: Secondary | ICD-10-CM | POA: Diagnosis not present

## 2021-01-19 DIAGNOSIS — R32 Unspecified urinary incontinence: Secondary | ICD-10-CM | POA: Diagnosis not present

## 2021-01-19 DIAGNOSIS — F418 Other specified anxiety disorders: Secondary | ICD-10-CM | POA: Diagnosis not present

## 2021-01-19 DIAGNOSIS — H04123 Dry eye syndrome of bilateral lacrimal glands: Secondary | ICD-10-CM | POA: Diagnosis not present

## 2021-01-19 DIAGNOSIS — Z7401 Bed confinement status: Secondary | ICD-10-CM | POA: Diagnosis not present

## 2021-01-19 DIAGNOSIS — E222 Syndrome of inappropriate secretion of antidiuretic hormone: Secondary | ICD-10-CM | POA: Diagnosis not present

## 2021-01-19 DIAGNOSIS — Z87440 Personal history of urinary (tract) infections: Secondary | ICD-10-CM | POA: Diagnosis not present

## 2021-01-19 DIAGNOSIS — L719 Rosacea, unspecified: Secondary | ICD-10-CM | POA: Diagnosis not present

## 2021-01-19 DIAGNOSIS — R55 Syncope and collapse: Secondary | ICD-10-CM | POA: Diagnosis not present

## 2021-01-19 DIAGNOSIS — Z741 Need for assistance with personal care: Secondary | ICD-10-CM | POA: Diagnosis not present

## 2021-01-19 DIAGNOSIS — G309 Alzheimer's disease, unspecified: Secondary | ICD-10-CM | POA: Diagnosis not present

## 2021-01-19 DIAGNOSIS — D631 Anemia in chronic kidney disease: Secondary | ICD-10-CM | POA: Diagnosis not present

## 2021-01-20 DIAGNOSIS — E222 Syndrome of inappropriate secretion of antidiuretic hormone: Secondary | ICD-10-CM | POA: Diagnosis not present

## 2021-01-20 DIAGNOSIS — G309 Alzheimer's disease, unspecified: Secondary | ICD-10-CM | POA: Diagnosis not present

## 2021-01-20 DIAGNOSIS — I4891 Unspecified atrial fibrillation: Secondary | ICD-10-CM | POA: Diagnosis not present

## 2021-01-20 DIAGNOSIS — Z87440 Personal history of urinary (tract) infections: Secondary | ICD-10-CM | POA: Diagnosis not present

## 2021-01-20 DIAGNOSIS — F0281 Dementia in other diseases classified elsewhere with behavioral disturbance: Secondary | ICD-10-CM | POA: Diagnosis not present

## 2021-01-20 DIAGNOSIS — R634 Abnormal weight loss: Secondary | ICD-10-CM | POA: Diagnosis not present

## 2021-01-29 DIAGNOSIS — G309 Alzheimer's disease, unspecified: Secondary | ICD-10-CM | POA: Diagnosis not present

## 2021-01-29 DIAGNOSIS — I4891 Unspecified atrial fibrillation: Secondary | ICD-10-CM | POA: Diagnosis not present

## 2021-01-29 DIAGNOSIS — F0281 Dementia in other diseases classified elsewhere with behavioral disturbance: Secondary | ICD-10-CM | POA: Diagnosis not present

## 2021-01-29 DIAGNOSIS — R634 Abnormal weight loss: Secondary | ICD-10-CM | POA: Diagnosis not present

## 2021-01-29 DIAGNOSIS — E222 Syndrome of inappropriate secretion of antidiuretic hormone: Secondary | ICD-10-CM | POA: Diagnosis not present

## 2021-01-29 DIAGNOSIS — Z87440 Personal history of urinary (tract) infections: Secondary | ICD-10-CM | POA: Diagnosis not present

## 2021-01-31 DIAGNOSIS — I4891 Unspecified atrial fibrillation: Secondary | ICD-10-CM | POA: Diagnosis not present

## 2021-01-31 DIAGNOSIS — F0281 Dementia in other diseases classified elsewhere with behavioral disturbance: Secondary | ICD-10-CM | POA: Diagnosis not present

## 2021-01-31 DIAGNOSIS — Z87440 Personal history of urinary (tract) infections: Secondary | ICD-10-CM | POA: Diagnosis not present

## 2021-01-31 DIAGNOSIS — G309 Alzheimer's disease, unspecified: Secondary | ICD-10-CM | POA: Diagnosis not present

## 2021-01-31 DIAGNOSIS — R634 Abnormal weight loss: Secondary | ICD-10-CM | POA: Diagnosis not present

## 2021-01-31 DIAGNOSIS — E222 Syndrome of inappropriate secretion of antidiuretic hormone: Secondary | ICD-10-CM | POA: Diagnosis not present

## 2021-02-01 ENCOUNTER — Non-Acute Institutional Stay (SKILLED_NURSING_FACILITY): Payer: Medicare Other | Admitting: Adult Health

## 2021-02-01 ENCOUNTER — Encounter: Payer: Self-pay | Admitting: Adult Health

## 2021-02-01 DIAGNOSIS — F321 Major depressive disorder, single episode, moderate: Secondary | ICD-10-CM

## 2021-02-01 DIAGNOSIS — I480 Paroxysmal atrial fibrillation: Secondary | ICD-10-CM | POA: Diagnosis not present

## 2021-02-01 DIAGNOSIS — H16123 Filamentary keratitis, bilateral: Secondary | ICD-10-CM | POA: Diagnosis not present

## 2021-02-01 DIAGNOSIS — L853 Xerosis cutis: Secondary | ICD-10-CM | POA: Diagnosis not present

## 2021-02-01 DIAGNOSIS — F0281 Dementia in other diseases classified elsewhere with behavioral disturbance: Secondary | ICD-10-CM | POA: Diagnosis not present

## 2021-02-01 DIAGNOSIS — G301 Alzheimer's disease with late onset: Secondary | ICD-10-CM | POA: Diagnosis not present

## 2021-02-01 DIAGNOSIS — F02818 Dementia in other diseases classified elsewhere, unspecified severity, with other behavioral disturbance: Secondary | ICD-10-CM

## 2021-02-01 NOTE — Progress Notes (Signed)
Location:  Occupational psychologist of Service:  SNF (31) Provider:   [name], ANP [place] [phone]   [name], DO  Patient Care Team: [name], DO as PCP - General (Geriatric Medicine) Community, Well [name], MD as Consulting Physician (Gastroenterology) [name], [name], MD as Consulting Physician (Dermatology) [name], MD as Consulting Physician (Ophthalmology) Royal Hawthorn, NP as Nurse Practitioner (Nurse Practitioner)  Extended Emergency Contact Information Primary Emergency Contact: [facility] Address: [address]          [place], [place] [address] [name] of [place] Phone: [phone] Work Phone: [phone] Mobile Phone: [phone] Relation: Spouse  Code Status:  DNR Goals of care: Advanced Directive information Advanced Directives 07/03/2020  Does Patient Have a Medical Advance Directive? Yes  Type of Paramedic of [place];Living will  Does patient want to make changes to medical advance directive? -  Copy of [place] in Chart? Yes - validated most recent copy scanned in chart (See row information)  Would patient like information on creating a medical advance directive? -  Pre-existing out of facility DNR order (yellow form or pink MOST form) -     Chief Complaint  Patient presents with  . Medical Management of Chronic Issues    HPI:  Pt is a 87 y.o. female seen today for medical management of chronic diseases.    Since moving to skilled care Ms. B initially had some behavioral disturbances which included physical and verbal aggression. She was started on Depakote and these behaviors have improved. She does have still resist and can be combative with care but the frequency and severity of these issues has decreased.   Afib: rate remains in the 50-60 range.    Intakes recorded range 1-25% to  26-50%.  Weight down 3 lbs since last month. No recorded issues with coughing or choking on food.  Wt Readings from Last 3 Encounters:  02/01/21 105 lb 1.6 oz (47.7 kg)  01/04/21 108 lb 3.2 oz (49.1 kg)  12/01/20 108 lb 3.2 oz (49.1 kg)   Hx of filamentary keratitis. No current issues with pain or inflammation. Has some dry skin around the outer area of the left eye. Nurse reports she may not have received the aquaphor for today yet Past Medical History:  Diagnosis Date  . Actinic keratoses   . Allergy   . Alzheimer disease ([place]) 05/21/2015  . Anxiety   . Arrhythmia    atrial fib  . Atrial fibrillation ([place]) 12/2012  . Basal cell carcinoma of skin   . Cataracts, both eyes 2005, 2013   Cataract extraction/IOL implant  . Current use of long term anticoagulation 12/2012   Eliquis  . Depression   . Diverticulosis 2008  . 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 Fracture of scaphoid bone of hand 08/05/2004  . History of UTI   . Hyperlipidemia   . Hypertension   . Hyponatremia    SIADH  . Low sodium levels   . Mild cognitive impairment   . Osteopenia   . Rosacea   . Squamous cell carcinoma   . Thyroid nodule    Past Surgical History:  Procedure Laterality Date  . ABDOMINAL HYSTERECTOMY  1970   TAH-BSO  . cataracts Bilateral 2/05 and 3/05  . CESAREAN SECTION     questionable    Allergies  Allergen Reactions  . Aricept [Donepezil]   . Solifenacin Other (See Comments)  .  Ciprofloxacin Other (See Comments)    Reaction unknown  . Latex Other (See Comments)    Reaction unknown  . Macrobid [Nitrofurantoin Macrocrystal] Nausea And Vomiting  . Macrodantin [Nitrofurantoin] Nausea Only    Upset stomach  . Other Other (See Comments)    Popcorn- Diverticulosis  . Strawberry Extract Other (See Comments)    Diverticulosis   . Tramadol Nausea Only    Outpatient Encounter Medications as of 02/01/2021  Medication Sig  . bisacodyl (DULCOLAX) 10 MG suppository Place 10 mg rectally as  needed for moderate constipation.  . divalproex (DEPAKOTE SPRINKLE) 125 MG capsule Take 250 mg by mouth in the morning.  . lactose free nutrition (BOOST) LIQD Take 237 mLs by mouth daily.  . magnesium hydroxide (MILK OF MAGNESIA) 400 MG/5ML suspension Take 20 mLs by mouth daily as needed for mild constipation.  . melatonin 3 MG TABS tablet Take 3 mg by mouth at bedtime as needed.  . metoprolol succinate (TOPROL-XL) 25 MG 24 hr tablet Take 12.5 mg by mouth every other day.   . mirtazapine (REMERON) 15 MG tablet Take 15 mg at bedtime by mouth.  . polyethylene glycol (MIRALAX / GLYCOLAX) packet 2 (two) times daily. Full cap full each morning and 1/2 cap full each evening.  . sodium chloride 1 G tablet Take 1 g by mouth daily.   Marland Kitchen tobramycin (TOBREX) 0.3 % ophthalmic solution Place 1 drop into the left eye in the morning, at noon, in the evening, and at bedtime.  Marland Kitchen trimethoprim (TRIMPEX) 100 MG tablet Take 100 mg by mouth daily.  Marland Kitchen UNABLE TO FIND in the morning and at bedtime. Preservative tears  . mineral oil-hydrophilic petrolatum (AQUAPHOR) ointment Apply topically 2 (two) times daily. Apply to left lower eye area qhs   No facility-administered encounter medications on file as of 02/01/2021.    Review of Systems  Unable to perform ROS: Dementia    Immunization History  Administered Date(s) Administered  . H1N1 12/04/2008  . Influenza, High Dose Seasonal PF 09/19/2019  . Influenza,inj,Quad PF,6+ Mos 09/11/2018  . Influenza-Unspecified 09/15/2003, 11/01/2010, 09/12/2011, 07/22/2016, 09/11/2017, 07/23/2020  . Moderna Sars-Covid-2 Vaccination 11/26/2019, 12/24/2019  . Pneumococcal Conjugate-13 04/14/2017  . Pneumococcal Polysaccharide-23 12/19/2008  . Td 09/15/2003, 01/06/2016  . Zoster Recombinat (Shingrix) 08/21/2017, 11/21/2017   Pertinent  Health Maintenance Due  Topic Date Due  . INFLUENZA VACCINE  Completed  . DEXA SCAN  Completed  . PNA vac Low Risk Adult  Completed   Fall  Risk  08/15/2018 07/12/2017 06/21/2017 05/04/2017 03/08/2017  Falls in the past year? No No No No No   Functional Status Survey:    Vitals:   02/01/21 1611  Weight: 105 lb 1.6 oz (47.7 kg)   Body mass index is 18.62 kg/m. Physical Exam Vitals and nursing note reviewed.  Constitutional:      General: She is not in acute distress.    Appearance: She is not diaphoretic.  HENT:     Head: Normocephalic and atraumatic.     Nose: Nose normal. No congestion.     Mouth/Throat:     Mouth: Mucous membranes are dry.  Eyes:     General:        Right eye: No discharge.        Left eye: No discharge.     Conjunctiva/sclera: Conjunctivae normal.     Pupils: Pupils are equal, round, and reactive to light.     Comments: Erythema and scaly dry skin noted to the left  eye lid and surrounding area.   Neck:     Vascular: No JVD.  Cardiovascular:     Rate and Rhythm: Normal rate. Rhythm irregular.     Heart sounds: No murmur heard.   Pulmonary:     Effort: Pulmonary effort is normal. No respiratory distress.     Breath sounds: Normal breath sounds. No wheezing.  Abdominal:     General: Bowel sounds are normal. There is no distension.     Palpations: Abdomen is soft.     Tenderness: There is no abdominal tenderness.  Musculoskeletal:     Right lower leg: No edema.     Left lower leg: No edema.  Skin:    General: Skin is warm and dry.  Neurological:     General: No focal deficit present.     Mental Status: She is alert. Mental status is at baseline.     Comments: Confused, not able to f/c or answer questions.      Labs reviewed: Recent Labs    05/11/20 0000  NA 139  K 4.5  CL 106  CO2 22  BUN 24*  CREATININE 0.9  CALCIUM 9.7   No results for input(s): AST, ALT, ALKPHOS, BILITOT, PROT, ALBUMIN in the last 8760 hours. Recent Labs    05/11/20 0000  WBC 5.2  HGB 11.8*  HCT 36  PLT 203   Lab Results  Component Value Date   TSH 2.29 10/18/2018   No results found for:  HGBA1C No results found for: CHOL, HDL, LDLCALC, LDLDIRECT, TRIG, CHOLHDL  Significant Diagnostic Results in last 30 days:  No results found.  Assessment/Plan   1. Paroxysmal atrial fibrillation (HCC) Rate is controlled No longer on Eliquis for CVA risk reduction due to lack of benefit.   2. Late onset Alzheimer's disease with behavioral disturbance ([place]) Severe with progression in the past year.  Doing well with current dose of depakote. No need for titration.   3. Depression, major, single episode, moderate (HCC) Its difficult to assess for depression in this setting but at this time I would continue her current regimen to prevent exacerbating any behavioral concerns.  Continues on Remeron 15 mg qhs.   4. Filamentary keratitis of both eyes No current issues.  Continues on tobramycin and artifical tears   5. Xerosis of skin The area around her left eyes appears dry with atopic quality. Recommend the aquaphor be given daily unless it causes issues with regards to her behaviors.

## 2021-02-03 DIAGNOSIS — F0281 Dementia in other diseases classified elsewhere with behavioral disturbance: Secondary | ICD-10-CM | POA: Diagnosis not present

## 2021-02-03 DIAGNOSIS — G309 Alzheimer's disease, unspecified: Secondary | ICD-10-CM | POA: Diagnosis not present

## 2021-02-03 DIAGNOSIS — I4891 Unspecified atrial fibrillation: Secondary | ICD-10-CM | POA: Diagnosis not present

## 2021-02-03 DIAGNOSIS — Z87440 Personal history of urinary (tract) infections: Secondary | ICD-10-CM | POA: Diagnosis not present

## 2021-02-03 DIAGNOSIS — R634 Abnormal weight loss: Secondary | ICD-10-CM | POA: Diagnosis not present

## 2021-02-03 DIAGNOSIS — E222 Syndrome of inappropriate secretion of antidiuretic hormone: Secondary | ICD-10-CM | POA: Diagnosis not present

## 2021-02-10 DIAGNOSIS — I4891 Unspecified atrial fibrillation: Secondary | ICD-10-CM | POA: Diagnosis not present

## 2021-02-10 DIAGNOSIS — G309 Alzheimer's disease, unspecified: Secondary | ICD-10-CM | POA: Diagnosis not present

## 2021-02-10 DIAGNOSIS — Z87440 Personal history of urinary (tract) infections: Secondary | ICD-10-CM | POA: Diagnosis not present

## 2021-02-10 DIAGNOSIS — R634 Abnormal weight loss: Secondary | ICD-10-CM | POA: Diagnosis not present

## 2021-02-10 DIAGNOSIS — F0281 Dementia in other diseases classified elsewhere with behavioral disturbance: Secondary | ICD-10-CM | POA: Diagnosis not present

## 2021-02-10 DIAGNOSIS — E222 Syndrome of inappropriate secretion of antidiuretic hormone: Secondary | ICD-10-CM | POA: Diagnosis not present

## 2021-02-11 ENCOUNTER — Non-Acute Institutional Stay (SKILLED_NURSING_FACILITY): Payer: Medicare Other | Admitting: Adult Health

## 2021-02-11 ENCOUNTER — Encounter: Payer: Self-pay | Admitting: Adult Health

## 2021-02-11 DIAGNOSIS — R233 Spontaneous ecchymoses: Secondary | ICD-10-CM | POA: Diagnosis not present

## 2021-02-11 DIAGNOSIS — T148XXA Other injury of unspecified body region, initial encounter: Secondary | ICD-10-CM

## 2021-02-11 DIAGNOSIS — A000 Cholera due to Vibrio cholerae 01, biovar cholerae: Secondary | ICD-10-CM | POA: Diagnosis not present

## 2021-02-11 LAB — BASIC METABOLIC PANEL WITH GFR
BUN: 26 — AB (ref 4–21)
CO2: 28 — AB (ref 13–22)
Chloride: 108 (ref 99–108)
Creatinine: 0.9 (ref 0.5–1.1)
Glucose: 108
Potassium: 4.5 (ref 3.4–5.3)
Sodium: 141 (ref 137–147)

## 2021-02-11 LAB — CBC AND DIFFERENTIAL
HCT: 32 — AB (ref 36–46)
Hemoglobin: 10.6 — AB (ref 12.0–16.0)
Platelets: 169 (ref 150–399)
WBC: 5.3

## 2021-02-11 LAB — COMPREHENSIVE METABOLIC PANEL WITH GFR: Calcium: 8.4 — AB (ref 8.7–10.7)

## 2021-02-11 LAB — CBC: RBC: 3.41 — AB (ref 3.87–5.11)

## 2021-02-11 NOTE — Progress Notes (Signed)
Location:  [facility] Room Number: 107-A Place of Service:  SNF [phone]) Provider:  Royal Hawthorn, NP  Patient Care Team: Virgie Dad, MD as PCP - General (Internal Medicine) Community, Well [name], MD as Consulting Physician (Gastroenterology) [name], [name], MD as Consulting Physician (Dermatology) [name], MD as Consulting Physician (Ophthalmology) Royal Hawthorn, NP as Nurse Practitioner (Nurse Practitioner)  Extended Emergency Contact Information Primary Emergency Contact: [facility] Address: [address]          [place], [place] [address] [name] of [place] Phone: [phone] Work Phone: [phone] Mobile Phone: [phone] Relation: Spouse  Code Status:  DNR  Goals of care: Advanced Directive information Advanced Directives 02/11/2021  Does Patient Have a Medical Advance Directive? Yes  Type of Paramedic of [place];Living will;Out of facility DNR (pink MOST or yellow form)  Does patient want to make changes to medical advance directive? No - Patient declined  Copy of [place] in Chart? Yes - validated most recent copy scanned in chart (See row information)  Would patient like information on creating a medical advance directive? -  Pre-existing out of facility DNR order (yellow form or pink MOST form) Yellow form placed in chart (order not valid for inpatient use);Pink MOST form placed in chart (order not valid for inpatient use)     Chief Complaint  Patient presents with  . Acute Visit    Bruising     HPI:  Pt is a 87 y.o. female seen today for an acute visit for bruising. The nurse reports that she has a raised hematoma to the left and right lower extremities. She also has some scattered bruising to the lower extremities. She has a petechial rash to both cheeks with mild swelling on the right. She is not having a fever or  other complaints. No sign of infection such as cough, sputum production, or other change of condition. There is no known injury. She is no longer on anticoagulation. She does have a hx of senile purpura but this is more pronounced.    Past Medical History:  Diagnosis Date  . Actinic keratoses   . Allergy   . Alzheimer disease ([place]) 05/21/2015  . Anxiety   . Arrhythmia    atrial fib  . Atrial fibrillation ([place]) 12/2012  . Basal cell carcinoma of skin   . Cataracts, both eyes 2005, 2013   Cataract extraction/IOL implant  . Current use of long term anticoagulation 12/2012   Eliquis  . Depression   . Diverticulosis 2008  . 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 Fracture of scaphoid bone of hand 08/05/2004  . History of UTI   . Hyperlipidemia   . Hypertension   . Hyponatremia    SIADH  . Low sodium levels   . Mild cognitive impairment   . Osteopenia   . Rosacea   . Squamous cell carcinoma   . Thyroid nodule    Past Surgical History:  Procedure Laterality Date  . ABDOMINAL HYSTERECTOMY  1970   TAH-BSO  . cataracts Bilateral 2/05 and 3/05  . CESAREAN SECTION     questionable    Allergies  Allergen Reactions  . Aricept [Donepezil]   . Solifenacin Other (See Comments)  . Ciprofloxacin Other (See Comments)    Reaction unknown  . Latex Other (See Comments)    Reaction unknown  . Macrobid [Nitrofurantoin Macrocrystal] Nausea And Vomiting  . Macrodantin [Nitrofurantoin] Nausea Only  Upset stomach  . Other Other (See Comments)    Popcorn- Diverticulosis  . Strawberry Extract Other (See Comments)    Diverticulosis   . Tramadol Nausea Only    Outpatient Encounter Medications as of 02/11/2021  Medication Sig  . bisacodyl (DULCOLAX) 10 MG suppository Place 10 mg rectally as needed for moderate constipation.  . divalproex (DEPAKOTE SPRINKLE) 125 MG capsule Take 250 mg by mouth in the morning.  . lactose free nutrition (BOOST) LIQD Take 237 mLs by mouth daily.  . magnesium  hydroxide (MILK OF MAGNESIA) 400 MG/5ML suspension Take 20 mLs by mouth daily as needed for mild constipation.  . melatonin 3 MG TABS tablet Take 3 mg by mouth at bedtime as needed.  . metoprolol succinate (TOPROL-XL) 25 MG 24 hr tablet Take 12.5 mg by mouth every other day. Hold is HR < 50 with am vitals, notify MD if pulse < 50 BPM more than 3 times in 1 week  . mineral oil-hydrophilic petrolatum (AQUAPHOR) ointment Apply 1 application topically 2 (two) times daily. And at bedtime to left lower eye area  . mirtazapine (REMERON) 15 MG tablet Take 15 mg at bedtime by mouth.  . polyethylene glycol (MIRALAX / GLYCOLAX) packet 2 (two) times daily. Full cap full each morning and 1/2 cap full each evening.  . sodium chloride 1 G tablet Take 1 g by mouth daily.   Marland Kitchen tobramycin (TOBREX) 0.3 % ophthalmic solution Place 1 drop into the left eye in the morning, at noon, in the evening, and at bedtime.  Marland Kitchen trimethoprim (TRIMPEX) 100 MG tablet Take 100 mg by mouth daily.  Marland Kitchen UNABLE TO FIND Place 1 drop into both eyes in the morning and at bedtime. Preservative tears   No facility-administered encounter medications on file as of 02/11/2021.    Review of Systems  Unable to perform ROS: Dementia    Immunization History  Administered Date(s) Administered  . H1N1 12/04/2008  . Influenza, High Dose Seasonal PF 09/19/2019  . Influenza,inj,Quad PF,6+ Mos 09/11/2018  . Influenza-Unspecified 09/15/2003, 11/01/2010, 09/12/2011, 07/22/2016, 09/11/2017, 07/23/2020  . Moderna SARS-COV2 Booster Vaccination 09/30/2020  . Moderna Sars-Covid-2 Vaccination 11/26/2019, 12/24/2019  . Pneumococcal Conjugate-13 04/14/2017  . Pneumococcal Polysaccharide-23 12/19/2008  . Td 09/15/2003, 01/06/2016  . Zoster Recombinat (Shingrix) 08/21/2017, 11/21/2017   Pertinent  Health Maintenance Due  Topic Date Due  . INFLUENZA VACCINE  Completed  . DEXA SCAN  Completed  . PNA vac Low Risk Adult  Completed   Fall Risk  08/15/2018  07/12/2017 06/21/2017 05/04/2017 03/08/2017  Falls in the past year? No No No No No   Functional Status Survey:    Vitals:   02/11/21 1356  BP: 117/73  Pulse: (!) 57  Resp: (!) 22  Temp: (!) 97.2 F (36.2 C)  TempSrc: Temporal  SpO2: 100%  Weight: 105 lb 1.6 oz (47.7 kg)  Height: 5\' 3"  (1.6 m)   Body mass index is 18.62 kg/m. Physical Exam Vitals and nursing note reviewed.  Constitutional:      General: She is not in acute distress.    Appearance: She is not diaphoretic.  HENT:     Head: Normocephalic and atraumatic.  Neck:     Vascular: No JVD.  Cardiovascular:     Rate and Rhythm: Normal rate. Rhythm irregular.     Heart sounds: No murmur heard.   Pulmonary:     Effort: Pulmonary effort is normal. No respiratory distress.     Breath sounds: Normal breath sounds. No  wheezing.  Abdominal:     General: Bowel sounds are normal.     Palpations: Abdomen is soft.  Musculoskeletal:     Right lower leg: Edema (+1) present.     Left lower leg: Edema (+1) present.  Skin:    General: Skin is warm and dry.     Findings: Bruising present.     Comments: BLE with hematomas and surrounding ecchymoses.  Petechial rash to both cheese R>L with mild swelling of the right cheek. Not tender. Not warm.   Neurological:     General: No focal deficit present.     Mental Status: She is alert. Mental status is at baseline.  Psychiatric:        Mood and Affect: Mood normal.     Labs reviewed: Recent Labs    05/11/20 0000  NA 139  K 4.5  CL 106  CO2 22  BUN 24*  CREATININE 0.9  CALCIUM 9.7   No results for input(s): AST, ALT, ALKPHOS, BILITOT, PROT, ALBUMIN in the last 8760 hours. Recent Labs    05/11/20 0000  WBC 5.2  HGB 11.8*  HCT 36  PLT 203   Lab Results  Component Value Date   TSH 2.29 10/18/2018   No results found for: HGBA1C No results found for: CHOL, HDL, LDLCALC, LDLDIRECT, TRIG, CHOLHDL  Significant Diagnostic Results in last 30 days:  No results  found.  Assessment/Plan  1. Petechial rash ?etiology discussed with Dr. Eulas Post. There are no signs of injury or acute infection. Will check labs for possible drug reaction with depakote  2. Hematoma To both lower ext.  Cover with padding and gauze to prevent rupture.  Keep legs elevated when possible  Will check for low platelets    Family/ staff Communication: discussed with Dr. Gildardo Cranker with hospice   Labs/tests ordered:  CBC with diff, BMP and depakote level

## 2021-02-12 DIAGNOSIS — G309 Alzheimer's disease, unspecified: Secondary | ICD-10-CM | POA: Diagnosis not present

## 2021-02-12 DIAGNOSIS — R634 Abnormal weight loss: Secondary | ICD-10-CM | POA: Diagnosis not present

## 2021-02-12 DIAGNOSIS — Z87440 Personal history of urinary (tract) infections: Secondary | ICD-10-CM | POA: Diagnosis not present

## 2021-02-12 DIAGNOSIS — F0281 Dementia in other diseases classified elsewhere with behavioral disturbance: Secondary | ICD-10-CM | POA: Diagnosis not present

## 2021-02-12 DIAGNOSIS — I4891 Unspecified atrial fibrillation: Secondary | ICD-10-CM | POA: Diagnosis not present

## 2021-02-12 DIAGNOSIS — E222 Syndrome of inappropriate secretion of antidiuretic hormone: Secondary | ICD-10-CM | POA: Diagnosis not present

## 2021-02-15 ENCOUNTER — Encounter: Payer: Self-pay | Admitting: *Deleted

## 2021-02-19 DIAGNOSIS — R159 Full incontinence of feces: Secondary | ICD-10-CM | POA: Diagnosis not present

## 2021-02-19 DIAGNOSIS — E222 Syndrome of inappropriate secretion of antidiuretic hormone: Secondary | ICD-10-CM | POA: Diagnosis not present

## 2021-02-19 DIAGNOSIS — D631 Anemia in chronic kidney disease: Secondary | ICD-10-CM | POA: Diagnosis not present

## 2021-02-19 DIAGNOSIS — F418 Other specified anxiety disorders: Secondary | ICD-10-CM | POA: Diagnosis not present

## 2021-02-19 DIAGNOSIS — Z681 Body mass index (BMI) 19 or less, adult: Secondary | ICD-10-CM | POA: Diagnosis not present

## 2021-02-19 DIAGNOSIS — Z7401 Bed confinement status: Secondary | ICD-10-CM | POA: Diagnosis not present

## 2021-02-19 DIAGNOSIS — N183 Chronic kidney disease, stage 3 unspecified: Secondary | ICD-10-CM | POA: Diagnosis not present

## 2021-02-19 DIAGNOSIS — H04123 Dry eye syndrome of bilateral lacrimal glands: Secondary | ICD-10-CM | POA: Diagnosis not present

## 2021-02-19 DIAGNOSIS — R32 Unspecified urinary incontinence: Secondary | ICD-10-CM | POA: Diagnosis not present

## 2021-02-19 DIAGNOSIS — G309 Alzheimer's disease, unspecified: Secondary | ICD-10-CM | POA: Diagnosis not present

## 2021-02-19 DIAGNOSIS — R634 Abnormal weight loss: Secondary | ICD-10-CM | POA: Diagnosis not present

## 2021-02-19 DIAGNOSIS — I4891 Unspecified atrial fibrillation: Secondary | ICD-10-CM | POA: Diagnosis not present

## 2021-02-19 DIAGNOSIS — R55 Syncope and collapse: Secondary | ICD-10-CM | POA: Diagnosis not present

## 2021-02-19 DIAGNOSIS — Z87440 Personal history of urinary (tract) infections: Secondary | ICD-10-CM | POA: Diagnosis not present

## 2021-02-19 DIAGNOSIS — Z741 Need for assistance with personal care: Secondary | ICD-10-CM | POA: Diagnosis not present

## 2021-02-19 DIAGNOSIS — F0281 Dementia in other diseases classified elsewhere with behavioral disturbance: Secondary | ICD-10-CM | POA: Diagnosis not present

## 2021-02-19 DIAGNOSIS — L719 Rosacea, unspecified: Secondary | ICD-10-CM | POA: Diagnosis not present

## 2021-02-22 DIAGNOSIS — Z87440 Personal history of urinary (tract) infections: Secondary | ICD-10-CM | POA: Diagnosis not present

## 2021-02-22 DIAGNOSIS — E222 Syndrome of inappropriate secretion of antidiuretic hormone: Secondary | ICD-10-CM | POA: Diagnosis not present

## 2021-02-22 DIAGNOSIS — R634 Abnormal weight loss: Secondary | ICD-10-CM | POA: Diagnosis not present

## 2021-02-22 DIAGNOSIS — F0281 Dementia in other diseases classified elsewhere with behavioral disturbance: Secondary | ICD-10-CM | POA: Diagnosis not present

## 2021-02-22 DIAGNOSIS — I4891 Unspecified atrial fibrillation: Secondary | ICD-10-CM | POA: Diagnosis not present

## 2021-02-22 DIAGNOSIS — G309 Alzheimer's disease, unspecified: Secondary | ICD-10-CM | POA: Diagnosis not present

## 2021-02-26 DIAGNOSIS — Z87440 Personal history of urinary (tract) infections: Secondary | ICD-10-CM | POA: Diagnosis not present

## 2021-02-26 DIAGNOSIS — I4891 Unspecified atrial fibrillation: Secondary | ICD-10-CM | POA: Diagnosis not present

## 2021-02-26 DIAGNOSIS — F0281 Dementia in other diseases classified elsewhere with behavioral disturbance: Secondary | ICD-10-CM | POA: Diagnosis not present

## 2021-02-26 DIAGNOSIS — R634 Abnormal weight loss: Secondary | ICD-10-CM | POA: Diagnosis not present

## 2021-02-26 DIAGNOSIS — G309 Alzheimer's disease, unspecified: Secondary | ICD-10-CM | POA: Diagnosis not present

## 2021-02-26 DIAGNOSIS — E222 Syndrome of inappropriate secretion of antidiuretic hormone: Secondary | ICD-10-CM | POA: Diagnosis not present

## 2021-03-01 DIAGNOSIS — F0281 Dementia in other diseases classified elsewhere with behavioral disturbance: Secondary | ICD-10-CM | POA: Diagnosis not present

## 2021-03-01 DIAGNOSIS — G309 Alzheimer's disease, unspecified: Secondary | ICD-10-CM | POA: Diagnosis not present

## 2021-03-01 DIAGNOSIS — Z87440 Personal history of urinary (tract) infections: Secondary | ICD-10-CM | POA: Diagnosis not present

## 2021-03-01 DIAGNOSIS — E222 Syndrome of inappropriate secretion of antidiuretic hormone: Secondary | ICD-10-CM | POA: Diagnosis not present

## 2021-03-01 DIAGNOSIS — I4891 Unspecified atrial fibrillation: Secondary | ICD-10-CM | POA: Diagnosis not present

## 2021-03-01 DIAGNOSIS — R634 Abnormal weight loss: Secondary | ICD-10-CM | POA: Diagnosis not present

## 2021-03-02 ENCOUNTER — Encounter: Payer: Self-pay | Admitting: Orthopedic Surgery

## 2021-03-03 DIAGNOSIS — R634 Abnormal weight loss: Secondary | ICD-10-CM | POA: Diagnosis not present

## 2021-03-03 DIAGNOSIS — F0281 Dementia in other diseases classified elsewhere with behavioral disturbance: Secondary | ICD-10-CM | POA: Diagnosis not present

## 2021-03-03 DIAGNOSIS — Z87440 Personal history of urinary (tract) infections: Secondary | ICD-10-CM | POA: Diagnosis not present

## 2021-03-03 DIAGNOSIS — G309 Alzheimer's disease, unspecified: Secondary | ICD-10-CM | POA: Diagnosis not present

## 2021-03-03 DIAGNOSIS — E222 Syndrome of inappropriate secretion of antidiuretic hormone: Secondary | ICD-10-CM | POA: Diagnosis not present

## 2021-03-03 DIAGNOSIS — I4891 Unspecified atrial fibrillation: Secondary | ICD-10-CM | POA: Diagnosis not present

## 2021-03-04 NOTE — Progress Notes (Signed)
Error

## 2021-03-08 ENCOUNTER — Encounter: Payer: Self-pay | Admitting: Internal Medicine

## 2021-03-08 ENCOUNTER — Non-Acute Institutional Stay (SKILLED_NURSING_FACILITY): Payer: Medicare Other | Admitting: Internal Medicine

## 2021-03-08 DIAGNOSIS — F321 Major depressive disorder, single episode, moderate: Secondary | ICD-10-CM

## 2021-03-08 DIAGNOSIS — T148XXA Other injury of unspecified body region, initial encounter: Secondary | ICD-10-CM | POA: Diagnosis not present

## 2021-03-08 DIAGNOSIS — G301 Alzheimer's disease with late onset: Secondary | ICD-10-CM | POA: Diagnosis not present

## 2021-03-08 DIAGNOSIS — E871 Hypo-osmolality and hyponatremia: Secondary | ICD-10-CM | POA: Diagnosis not present

## 2021-03-08 DIAGNOSIS — R634 Abnormal weight loss: Secondary | ICD-10-CM | POA: Diagnosis not present

## 2021-03-08 DIAGNOSIS — Z87440 Personal history of urinary (tract) infections: Secondary | ICD-10-CM

## 2021-03-08 DIAGNOSIS — I480 Paroxysmal atrial fibrillation: Secondary | ICD-10-CM | POA: Diagnosis not present

## 2021-03-08 DIAGNOSIS — F02818 Dementia in other diseases classified elsewhere, unspecified severity, with other behavioral disturbance: Secondary | ICD-10-CM

## 2021-03-08 DIAGNOSIS — F0281 Dementia in other diseases classified elsewhere with behavioral disturbance: Secondary | ICD-10-CM | POA: Diagnosis not present

## 2021-03-08 NOTE — Progress Notes (Signed)
Location:    [facility] Room Number: 107 Place of Service:  SNF [phone]) Provider:  Veleta Miners MD  Virgie Dad, MD  Patient Care Team: Virgie Dad, MD as PCP - General (Internal Medicine) Community, Well [name], MD as Consulting Physician (Gastroenterology) [name], [name], MD as Consulting Physician (Dermatology) [name], MD as Consulting Physician (Ophthalmology) Royal Hawthorn, NP as Nurse Practitioner (Nurse Practitioner)  Extended Emergency Contact Information Primary Emergency Contact: [facility] Address: [address]          [place], [place] [address] [name] of [place] Phone: [phone] Work Phone: [phone] Mobile Phone: [phone] Relation: Spouse  Code Status:  DNR Hospice Goals of care: Advanced Directive information Advanced Directives 03/08/2021  Does Patient Have a Medical Advance Directive? Yes  Type of Paramedic of [place];Living will;Out of facility DNR (pink MOST or yellow form)  Does patient want to make changes to medical advance directive? -  Copy of [place] in Chart? Yes - validated most recent copy scanned in chart (See row information)  Would patient like information on creating a medical advance directive? -  Pre-existing out of facility DNR order (yellow form or pink MOST form) Yellow form placed in chart (order not valid for inpatient use);Pink MOST form placed in chart (order not valid for inpatient use)     Chief Complaint  Patient presents with  . Medical Management of Chronic Issues    HPI:  Pt is a 87 y.o. female seen today for medical management of chronic diseases.    Patient lives in [facility] Has Advanced Dementia.with Behaviors issues A Fib Off her Anticoagulation Depression Urinary Incontinence Failure to thrive  Seems stable Has lost weight over past Year but  stable recently. No new behaviors per Nurses. Does get agitated sometimes when Trying to do her Vitals Does have hematoma in her right leg which nurses are dressing every other day     Past Medical History:  Diagnosis Date  . Actinic keratoses   . Allergy   . Alzheimer disease ([place]) 05/21/2015  . Anxiety   . Arrhythmia    atrial fib  . Atrial fibrillation ([place]) 12/2012  . Basal cell carcinoma of skin   . Cataracts, both eyes 2005, 2013   Cataract extraction/IOL implant  . Current use of long term anticoagulation 12/2012   Eliquis  . Depression   . Diverticulosis 2008  . 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 Fracture of scaphoid bone of hand 08/05/2004  . History of UTI   . Hyperlipidemia   . Hypertension   . Hyponatremia    SIADH  . Low sodium levels   . Mild cognitive impairment   . Osteopenia   . Rosacea   . Squamous cell carcinoma   . Thyroid nodule    Past Surgical History:  Procedure Laterality Date  . ABDOMINAL HYSTERECTOMY  1970   TAH-BSO  . cataracts Bilateral 2/05 and 3/05  . CESAREAN SECTION     questionable    Allergies  Allergen Reactions  . Aricept [Donepezil]   . Solifenacin Other (See Comments)  . Ciprofloxacin Other (See Comments)    Reaction unknown  . Latex Other (See Comments)    Reaction unknown  . Macrobid [Nitrofurantoin Macrocrystal] Nausea And Vomiting  . Macrodantin [Nitrofurantoin] Nausea Only    Upset stomach  . Other Other (See Comments)    Popcorn- Diverticulosis  . Strawberry Extract  Other (See Comments)    Diverticulosis   . Tramadol Nausea Only    Allergies as of 03/08/2021      Reactions   Aricept [donepezil]    Solifenacin Other (See Comments)   Ciprofloxacin Other (See Comments)   Reaction unknown   Latex Other (See Comments)   Reaction unknown   Macrobid [nitrofurantoin Macrocrystal] Nausea And Vomiting   Macrodantin [nitrofurantoin] Nausea Only   Upset stomach   Other Other (See Comments)   Popcorn-  Diverticulosis   Strawberry Extract Other (See Comments)   Diverticulosis    Tramadol Nausea Only      Medication List       Accurate as of March 08, 2021 10:01 AM. If you have any questions, ask your nurse or doctor.        acetaminophen 325 MG tablet Commonly known as: TYLENOL Take 650 mg by mouth every 4 (four) hours as needed.   bisacodyl 10 MG suppository Commonly known as: DULCOLAX Place 10 mg rectally as needed for moderate constipation.   divalproex 125 MG capsule Commonly known as: DEPAKOTE SPRINKLE Take 250 mg by mouth in the morning.   lactose free nutrition Liqd Take 237 mLs by mouth daily.   magnesium hydroxide 400 MG/5ML suspension Commonly known as: MILK OF MAGNESIA Take 20 mLs by mouth daily as needed for mild constipation.   melatonin 3 MG Tabs tablet Take 3 mg by mouth at bedtime as needed.   metoprolol succinate 25 MG 24 hr tablet Commonly known as: TOPROL-XL Take 12.5 mg by mouth every other day. Hold is HR < 50 with am vitals, notify MD if pulse < 50 BPM more than 3 times in 1 week   mineral oil-hydrophilic petrolatum ointment Apply 1 application topically 2 (two) times daily. And at bedtime to left lower eye area   mirtazapine 15 MG tablet Commonly known as: REMERON Take 15 mg at bedtime by mouth.   ondansetron 4 MG tablet Commonly known as: ZOFRAN Take 4 mg by mouth every 6 (six) hours as needed for nausea or vomiting.   polyethylene glycol 17 g packet Commonly known as: MIRALAX / GLYCOLAX 2 (two) times daily. Full cap full each morning and 1/2 cap full each evening.   sodium chloride 1 g tablet Take 1 g by mouth daily.   tobramycin 0.3 % ophthalmic solution Commonly known as: TOBREX Place 1 drop into the left eye in the morning, at noon, in the evening, and at bedtime.   trimethoprim 100 MG tablet Commonly known as: TRIMPEX Take 100 mg by mouth daily.   UNABLE TO FIND Place 1 drop into both eyes in the morning and at bedtime.  Preservative tears       Review of Systems  Unable to perform ROS: Dementia    Immunization History  Administered Date(s) Administered  . H1N1 12/04/2008  . Influenza, High Dose Seasonal PF 09/19/2019  . Influenza,inj,Quad PF,6+ Mos 09/11/2018  . Influenza-Unspecified 09/15/2003, 11/01/2010, 09/12/2011, 07/22/2016, 09/11/2017, 07/23/2020  . Moderna SARS-COV2 Booster Vaccination 09/30/2020  . Moderna Sars-Covid-2 Vaccination 11/26/2019, 12/24/2019  . Pneumococcal Conjugate-13 04/14/2017  . Pneumococcal Polysaccharide-23 12/19/2008  . Td 09/15/2003, 01/06/2016  . Zoster Recombinat (Shingrix) 08/21/2017, 11/21/2017   Pertinent  Health Maintenance Due  Topic Date Due  . INFLUENZA VACCINE  06/21/2021  . DEXA SCAN  Completed  . PNA vac Low Risk Adult  Completed   Fall Risk  08/15/2018 07/12/2017 06/21/2017 05/04/2017 03/08/2017  Falls in the past year? No No No  No No   Functional Status Survey:    Vitals:   03/08/21 0954  BP: 117/73  Pulse: (!) 59  Resp: (!) 22  Temp: (!) 97 F (36.1 C)  SpO2: 100%  Weight: 108 lb 6.4 oz (49.2 kg)  Height: 5\' 3"  (1.6 m)   Body mass index is 19.2 kg/m. Physical Exam Vitals reviewed.  Constitutional:      Appearance: Normal appearance.  HENT:     Head: Normocephalic.     Nose: Nose normal.     Mouth/Throat:     Mouth: Mucous membranes are moist.     Pharynx: Oropharynx is clear.  Eyes:     Pupils: Pupils are equal, round, and reactive to light.  Cardiovascular:     Rate and Rhythm: Normal rate and regular rhythm.     Pulses: Normal pulses.  Pulmonary:     Effort: Pulmonary effort is normal.     Breath sounds: Normal breath sounds.  Abdominal:     General: Abdomen is flat. Bowel sounds are normal.     Palpations: Abdomen is soft.  Musculoskeletal:        General: No swelling.     Cervical back: Neck supple.  Skin:    General: Skin is warm.     Comments: Dry Healing Hematoma in Right Outer Shin. No Signs of Infection or  discharge  Neurological:     General: No focal deficit present.     Mental Status: She is alert.  Psychiatric:        Mood and Affect: Mood normal.        Thought Content: Thought content normal.      Labs reviewed: Recent Labs    05/11/20 0000 02/11/21 0000  NA 139 141  K 4.5 4.5  CL 106 108  CO2 22 28*  BUN 24* 26*  CREATININE 0.9 0.9  CALCIUM 9.7 8.4*   No results for input(s): AST, ALT, ALKPHOS, BILITOT, PROT, ALBUMIN in the last 8760 hours. Recent Labs    05/11/20 0000 02/11/21 0000  WBC 5.2 5.3  HGB 11.8* 10.6*  HCT 36 32*  PLT 203 169   Lab Results  Component Value Date   TSH 2.29 10/18/2018   No results found for: HGBA1C No results found for: CHOL, HDL, LDLCALC, LDLDIRECT, TRIG, CHOLHDL  Significant Diagnostic Results in last 30 days:  No results found.  Assessment/Plan Paroxysmal atrial fibrillation (HCC) HR in 50s  Patient resists Vitals  for Nurses to monitor her HR Discontinue Metoprolol for now Is oFf Anticoagulation due to fraility falls and Recurent Hematoma  Late onset Alzheimer's disease with behavioral disturbance ([place]) Doing well on Depakote Enrolled in Hospice  Hematoma Covering With Telfa for protection Looks Good Depression, major, single episode, moderate (HCC) On Remeron Weight loss On Remeron Hyponatremia Discontinue Sodium tablet Last sodium 141 History of recurrent UTIs On Trimethoprim Chronic Conjuctivitis On Tobramycin   Family/ staff Communication:   Labs/tests ordered:

## 2021-03-11 DIAGNOSIS — G309 Alzheimer's disease, unspecified: Secondary | ICD-10-CM | POA: Diagnosis not present

## 2021-03-11 DIAGNOSIS — I4891 Unspecified atrial fibrillation: Secondary | ICD-10-CM | POA: Diagnosis not present

## 2021-03-11 DIAGNOSIS — E222 Syndrome of inappropriate secretion of antidiuretic hormone: Secondary | ICD-10-CM | POA: Diagnosis not present

## 2021-03-11 DIAGNOSIS — Z87440 Personal history of urinary (tract) infections: Secondary | ICD-10-CM | POA: Diagnosis not present

## 2021-03-11 DIAGNOSIS — R634 Abnormal weight loss: Secondary | ICD-10-CM | POA: Diagnosis not present

## 2021-03-11 DIAGNOSIS — F0281 Dementia in other diseases classified elsewhere with behavioral disturbance: Secondary | ICD-10-CM | POA: Diagnosis not present

## 2021-03-18 DIAGNOSIS — Z87440 Personal history of urinary (tract) infections: Secondary | ICD-10-CM | POA: Diagnosis not present

## 2021-03-18 DIAGNOSIS — R634 Abnormal weight loss: Secondary | ICD-10-CM | POA: Diagnosis not present

## 2021-03-18 DIAGNOSIS — F0281 Dementia in other diseases classified elsewhere with behavioral disturbance: Secondary | ICD-10-CM | POA: Diagnosis not present

## 2021-03-18 DIAGNOSIS — E222 Syndrome of inappropriate secretion of antidiuretic hormone: Secondary | ICD-10-CM | POA: Diagnosis not present

## 2021-03-18 DIAGNOSIS — G309 Alzheimer's disease, unspecified: Secondary | ICD-10-CM | POA: Diagnosis not present

## 2021-03-18 DIAGNOSIS — I4891 Unspecified atrial fibrillation: Secondary | ICD-10-CM | POA: Diagnosis not present

## 2021-03-21 DIAGNOSIS — N183 Chronic kidney disease, stage 3 unspecified: Secondary | ICD-10-CM | POA: Diagnosis not present

## 2021-03-21 DIAGNOSIS — D631 Anemia in chronic kidney disease: Secondary | ICD-10-CM | POA: Diagnosis not present

## 2021-03-21 DIAGNOSIS — Z741 Need for assistance with personal care: Secondary | ICD-10-CM | POA: Diagnosis not present

## 2021-03-21 DIAGNOSIS — Z7401 Bed confinement status: Secondary | ICD-10-CM | POA: Diagnosis not present

## 2021-03-21 DIAGNOSIS — R55 Syncope and collapse: Secondary | ICD-10-CM | POA: Diagnosis not present

## 2021-03-21 DIAGNOSIS — F418 Other specified anxiety disorders: Secondary | ICD-10-CM | POA: Diagnosis not present

## 2021-03-21 DIAGNOSIS — G309 Alzheimer's disease, unspecified: Secondary | ICD-10-CM | POA: Diagnosis not present

## 2021-03-21 DIAGNOSIS — F0281 Dementia in other diseases classified elsewhere with behavioral disturbance: Secondary | ICD-10-CM | POA: Diagnosis not present

## 2021-03-21 DIAGNOSIS — H04123 Dry eye syndrome of bilateral lacrimal glands: Secondary | ICD-10-CM | POA: Diagnosis not present

## 2021-03-21 DIAGNOSIS — L719 Rosacea, unspecified: Secondary | ICD-10-CM | POA: Diagnosis not present

## 2021-03-21 DIAGNOSIS — E222 Syndrome of inappropriate secretion of antidiuretic hormone: Secondary | ICD-10-CM | POA: Diagnosis not present

## 2021-03-21 DIAGNOSIS — R159 Full incontinence of feces: Secondary | ICD-10-CM | POA: Diagnosis not present

## 2021-03-21 DIAGNOSIS — R634 Abnormal weight loss: Secondary | ICD-10-CM | POA: Diagnosis not present

## 2021-03-21 DIAGNOSIS — R32 Unspecified urinary incontinence: Secondary | ICD-10-CM | POA: Diagnosis not present

## 2021-03-21 DIAGNOSIS — I4891 Unspecified atrial fibrillation: Secondary | ICD-10-CM | POA: Diagnosis not present

## 2021-03-21 DIAGNOSIS — Z681 Body mass index (BMI) 19 or less, adult: Secondary | ICD-10-CM | POA: Diagnosis not present

## 2021-03-21 DIAGNOSIS — Z87440 Personal history of urinary (tract) infections: Secondary | ICD-10-CM | POA: Diagnosis not present

## 2021-03-23 DIAGNOSIS — E222 Syndrome of inappropriate secretion of antidiuretic hormone: Secondary | ICD-10-CM | POA: Diagnosis not present

## 2021-03-23 DIAGNOSIS — F0281 Dementia in other diseases classified elsewhere with behavioral disturbance: Secondary | ICD-10-CM | POA: Diagnosis not present

## 2021-03-23 DIAGNOSIS — I4891 Unspecified atrial fibrillation: Secondary | ICD-10-CM | POA: Diagnosis not present

## 2021-03-23 DIAGNOSIS — G309 Alzheimer's disease, unspecified: Secondary | ICD-10-CM | POA: Diagnosis not present

## 2021-03-23 DIAGNOSIS — R634 Abnormal weight loss: Secondary | ICD-10-CM | POA: Diagnosis not present

## 2021-03-23 DIAGNOSIS — Z87440 Personal history of urinary (tract) infections: Secondary | ICD-10-CM | POA: Diagnosis not present

## 2021-03-31 DIAGNOSIS — R634 Abnormal weight loss: Secondary | ICD-10-CM | POA: Diagnosis not present

## 2021-03-31 DIAGNOSIS — I4891 Unspecified atrial fibrillation: Secondary | ICD-10-CM | POA: Diagnosis not present

## 2021-03-31 DIAGNOSIS — E222 Syndrome of inappropriate secretion of antidiuretic hormone: Secondary | ICD-10-CM | POA: Diagnosis not present

## 2021-03-31 DIAGNOSIS — Z87440 Personal history of urinary (tract) infections: Secondary | ICD-10-CM | POA: Diagnosis not present

## 2021-03-31 DIAGNOSIS — F0281 Dementia in other diseases classified elsewhere with behavioral disturbance: Secondary | ICD-10-CM | POA: Diagnosis not present

## 2021-03-31 DIAGNOSIS — G309 Alzheimer's disease, unspecified: Secondary | ICD-10-CM | POA: Diagnosis not present

## 2021-04-02 DIAGNOSIS — I4891 Unspecified atrial fibrillation: Secondary | ICD-10-CM | POA: Diagnosis not present

## 2021-04-02 DIAGNOSIS — E222 Syndrome of inappropriate secretion of antidiuretic hormone: Secondary | ICD-10-CM | POA: Diagnosis not present

## 2021-04-02 DIAGNOSIS — Z87440 Personal history of urinary (tract) infections: Secondary | ICD-10-CM | POA: Diagnosis not present

## 2021-04-02 DIAGNOSIS — F0281 Dementia in other diseases classified elsewhere with behavioral disturbance: Secondary | ICD-10-CM | POA: Diagnosis not present

## 2021-04-02 DIAGNOSIS — R634 Abnormal weight loss: Secondary | ICD-10-CM | POA: Diagnosis not present

## 2021-04-02 DIAGNOSIS — G309 Alzheimer's disease, unspecified: Secondary | ICD-10-CM | POA: Diagnosis not present

## 2021-04-03 DIAGNOSIS — R634 Abnormal weight loss: Secondary | ICD-10-CM | POA: Diagnosis not present

## 2021-04-03 DIAGNOSIS — E222 Syndrome of inappropriate secretion of antidiuretic hormone: Secondary | ICD-10-CM | POA: Diagnosis not present

## 2021-04-03 DIAGNOSIS — G309 Alzheimer's disease, unspecified: Secondary | ICD-10-CM | POA: Diagnosis not present

## 2021-04-03 DIAGNOSIS — F0281 Dementia in other diseases classified elsewhere with behavioral disturbance: Secondary | ICD-10-CM | POA: Diagnosis not present

## 2021-04-03 DIAGNOSIS — I4891 Unspecified atrial fibrillation: Secondary | ICD-10-CM | POA: Diagnosis not present

## 2021-04-03 DIAGNOSIS — Z87440 Personal history of urinary (tract) infections: Secondary | ICD-10-CM | POA: Diagnosis not present

## 2021-04-05 ENCOUNTER — Encounter: Payer: Self-pay | Admitting: Adult Health

## 2021-04-05 DIAGNOSIS — E222 Syndrome of inappropriate secretion of antidiuretic hormone: Secondary | ICD-10-CM | POA: Diagnosis not present

## 2021-04-05 DIAGNOSIS — R634 Abnormal weight loss: Secondary | ICD-10-CM | POA: Diagnosis not present

## 2021-04-05 DIAGNOSIS — F0281 Dementia in other diseases classified elsewhere with behavioral disturbance: Secondary | ICD-10-CM | POA: Diagnosis not present

## 2021-04-05 DIAGNOSIS — G309 Alzheimer's disease, unspecified: Secondary | ICD-10-CM | POA: Diagnosis not present

## 2021-04-05 DIAGNOSIS — I4891 Unspecified atrial fibrillation: Secondary | ICD-10-CM | POA: Diagnosis not present

## 2021-04-05 DIAGNOSIS — Z87440 Personal history of urinary (tract) infections: Secondary | ICD-10-CM | POA: Diagnosis not present

## 2021-04-05 NOTE — Progress Notes (Signed)
Location:   [facility] Room Number: 107-A Place of Service:  SNF [phone]) Provider:  Royal Hawthorn, NP    Patient Care Team: Virgie Dad, MD as PCP - General (Internal Medicine) Community, Well [name], MD as Consulting Physician (Gastroenterology) [name], [name], MD as Consulting Physician (Dermatology) [name], MD as Consulting Physician (Ophthalmology) Royal Hawthorn, NP as Nurse Practitioner (Nurse Practitioner)  Extended Emergency Contact Information Primary Emergency Contact: [facility] Address: [address]          [place], [place] [address] [name] of [place] Phone: [phone] Work Phone: [phone] Mobile Phone: [phone] Relation: Spouse  Code Status:  DNR Goals of care: Advanced Directive information Advanced Directives 04/05/2021  Does Patient Have a Medical Advance Directive? Yes  Type of Advance Directive Out of facility DNR (pink MOST or yellow form);Living will  Does patient want to make changes to medical advance directive? No - Patient declined  Copy of [place] in Chart? -  Would patient like information on creating a medical advance directive? -  Pre-existing out of facility DNR order (yellow form or pink MOST form) -     Chief Complaint  Patient presents with  . Medical Management of Chronic Issues    Routine Visit.    HPI:  Pt is a 87 y.o. female seen today for medical management of chronic diseases.     Past Medical History:  Diagnosis Date  . Actinic keratoses   . Allergy   . Alzheimer disease ([place]) 05/21/2015  . Anxiety   . Arrhythmia    atrial fib  . Atrial fibrillation ([place]) 12/2012  . Basal cell carcinoma of skin   . Cataracts, both eyes 2005, 2013   Cataract extraction/IOL implant  . Current use of long term anticoagulation 12/2012   Eliquis  . Depression   . Diverticulosis 2008  . Filamentary keratitis  12/22/2020  . Fracture of scaphoid bone of hand 08/05/2004  . History of UTI   . Hyperlipidemia   . Hypertension   . Hyponatremia    SIADH  . Low sodium levels   . Mild cognitive impairment   . Osteopenia   . Rosacea   . Squamous cell carcinoma   . Thyroid nodule    Past Surgical History:  Procedure Laterality Date  . ABDOMINAL HYSTERECTOMY  1970   TAH-BSO  . cataracts Bilateral 2/05 and 3/05  . CESAREAN SECTION     questionable    Allergies  Allergen Reactions  . Aricept [Donepezil]   . Solifenacin Other (See Comments)  . Sulfa Antibiotics   . Terbinafine And Related   . Ciprofloxacin Other (See Comments)    Reaction unknown  . Latex Other (See Comments)    Reaction unknown  . Macrobid [Nitrofurantoin Macrocrystal] Nausea And Vomiting  . Macrodantin [Nitrofurantoin] Nausea Only    Upset stomach  . Other Other (See Comments)    Popcorn- Diverticulosis  . Strawberry Extract Other (See Comments)    Diverticulosis   . Tramadol Nausea Only    Allergies as of 04/05/2021      Reactions   Aricept [donepezil]    Solifenacin Other (See Comments)   Sulfa Antibiotics    Terbinafine And Related    Ciprofloxacin Other (See Comments)   Reaction unknown   Latex Other (See Comments)   Reaction unknown   Macrobid [nitrofurantoin Macrocrystal] Nausea And Vomiting   Macrodantin [nitrofurantoin] Nausea Only   Upset stomach  Other Other (See Comments)   Popcorn- Diverticulosis   Strawberry Extract Other (See Comments)   Diverticulosis    Tramadol Nausea Only      Medication List       Accurate as of Apr 05, 2021 10:35 AM. If you have any questions, ask your nurse or doctor.        bisacodyl 10 MG suppository Commonly known as: DULCOLAX Place 10 mg rectally as needed for moderate constipation.   divalproex 125 MG capsule Commonly known as: DEPAKOTE SPRINKLE Take 250 mg by mouth in the morning.   lactose free nutrition Liqd Take 237 mLs by mouth daily.    magnesium hydroxide 400 MG/5ML suspension Commonly known as: MILK OF MAGNESIA Take 20 mLs by mouth daily as needed for mild constipation.   melatonin 3 MG Tabs tablet Take 3 mg by mouth at bedtime as needed.   mineral oil-hydrophilic petrolatum ointment Apply 1 application topically 2 (two) times daily. And at bedtime to left lower eye area   mirtazapine 15 MG tablet Commonly known as: REMERON Take 15 mg at bedtime by mouth.   ondansetron 4 MG tablet Commonly known as: ZOFRAN Take 4 mg by mouth every 6 (six) hours as needed for nausea or vomiting.   polyethylene glycol 17 g packet Commonly known as: MIRALAX / GLYCOLAX 2 (two) times daily. Full cap full each morning and 1/2 cap full each evening.   tobramycin 0.3 % ophthalmic solution Commonly known as: TOBREX Place 1 drop into the left eye in the morning, at noon, in the evening, and at bedtime.   trimethoprim 100 MG tablet Commonly known as: TRIMPEX Take 100 mg by mouth daily.   UNABLE TO FIND Place 1 drop into both eyes in the morning and at bedtime. Preservative tears       Review of Systems  Immunization History  Administered Date(s) Administered  . H1N1 12/04/2008  . Influenza, High Dose Seasonal PF 09/19/2019  . Influenza,inj,Quad PF,6+ Mos 09/11/2018  . Influenza-Unspecified 09/15/2003, 11/01/2010, 09/12/2011, 07/22/2016, 09/11/2017, 07/23/2020  . Moderna SARS-COV2 Booster Vaccination 09/30/2020  . Moderna Sars-Covid-2 Vaccination 11/26/2019, 12/24/2019  . Pneumococcal Conjugate-13 04/14/2017  . Pneumococcal Polysaccharide-23 12/19/2008  . Td 09/15/2003, 01/06/2016  . Zoster Recombinat (Shingrix) 08/21/2017, 11/21/2017   Pertinent  Health Maintenance Due  Topic Date Due  . INFLUENZA VACCINE  06/21/2021  . DEXA SCAN  Completed  . PNA vac Low Risk Adult  Completed   Fall Risk  08/15/2018 07/12/2017 06/21/2017 05/04/2017 03/08/2017  Falls in the past year? No No No No No   Functional Status Survey:     Vitals:   04/05/21 1028  BP: 117/73  Pulse: (!) 49  Temp: (!) 97 F (36.1 C)  SpO2: 97%  Weight: 110 lb (49.9 kg)  Height: 5\' 3"  (1.6 m)   Body mass index is 19.49 kg/m. Physical Exam  Labs reviewed: Recent Labs    05/11/20 0000 02/11/21 0000  NA 139 141  K 4.5 4.5  CL 106 108  CO2 22 28*  BUN 24* 26*  CREATININE 0.9 0.9  CALCIUM 9.7 8.4*   No results for input(s): AST, ALT, ALKPHOS, BILITOT, PROT, ALBUMIN in the last 8760 hours. Recent Labs    05/11/20 0000 02/11/21 0000  WBC 5.2 5.3  HGB 11.8* 10.6*  HCT 36 32*  PLT 203 169   Lab Results  Component Value Date   TSH 2.29 10/18/2018   No results found for: HGBA1C No results found for: CHOL, HDL,  LDLCALC, LDLDIRECT, TRIG, CHOLHDL  Significant Diagnostic Results in last 30 days:  No results found.  Assessment/Plan There are no diagnoses linked to this encounter.   Family/ staff Communication:   Labs/tests ordered:     This encounter was created in error - please disregard.

## 2021-04-06 DIAGNOSIS — Z87440 Personal history of urinary (tract) infections: Secondary | ICD-10-CM | POA: Diagnosis not present

## 2021-04-06 DIAGNOSIS — E222 Syndrome of inappropriate secretion of antidiuretic hormone: Secondary | ICD-10-CM | POA: Diagnosis not present

## 2021-04-06 DIAGNOSIS — R634 Abnormal weight loss: Secondary | ICD-10-CM | POA: Diagnosis not present

## 2021-04-06 DIAGNOSIS — G309 Alzheimer's disease, unspecified: Secondary | ICD-10-CM | POA: Diagnosis not present

## 2021-04-06 DIAGNOSIS — I4891 Unspecified atrial fibrillation: Secondary | ICD-10-CM | POA: Diagnosis not present

## 2021-04-06 DIAGNOSIS — F0281 Dementia in other diseases classified elsewhere with behavioral disturbance: Secondary | ICD-10-CM | POA: Diagnosis not present

## 2021-04-08 DIAGNOSIS — I4891 Unspecified atrial fibrillation: Secondary | ICD-10-CM | POA: Diagnosis not present

## 2021-04-08 DIAGNOSIS — F0281 Dementia in other diseases classified elsewhere with behavioral disturbance: Secondary | ICD-10-CM | POA: Diagnosis not present

## 2021-04-08 DIAGNOSIS — R634 Abnormal weight loss: Secondary | ICD-10-CM | POA: Diagnosis not present

## 2021-04-08 DIAGNOSIS — E222 Syndrome of inappropriate secretion of antidiuretic hormone: Secondary | ICD-10-CM | POA: Diagnosis not present

## 2021-04-08 DIAGNOSIS — Z87440 Personal history of urinary (tract) infections: Secondary | ICD-10-CM | POA: Diagnosis not present

## 2021-04-08 DIAGNOSIS — G309 Alzheimer's disease, unspecified: Secondary | ICD-10-CM | POA: Diagnosis not present

## 2021-04-12 DIAGNOSIS — I4891 Unspecified atrial fibrillation: Secondary | ICD-10-CM | POA: Diagnosis not present

## 2021-04-12 DIAGNOSIS — Z87440 Personal history of urinary (tract) infections: Secondary | ICD-10-CM | POA: Diagnosis not present

## 2021-04-12 DIAGNOSIS — G309 Alzheimer's disease, unspecified: Secondary | ICD-10-CM | POA: Diagnosis not present

## 2021-04-12 DIAGNOSIS — R634 Abnormal weight loss: Secondary | ICD-10-CM | POA: Diagnosis not present

## 2021-04-12 DIAGNOSIS — F0281 Dementia in other diseases classified elsewhere with behavioral disturbance: Secondary | ICD-10-CM | POA: Diagnosis not present

## 2021-04-12 DIAGNOSIS — E222 Syndrome of inappropriate secretion of antidiuretic hormone: Secondary | ICD-10-CM | POA: Diagnosis not present

## 2021-04-16 ENCOUNTER — Encounter: Payer: Self-pay | Admitting: Adult Health

## 2021-04-16 ENCOUNTER — Non-Acute Institutional Stay (SKILLED_NURSING_FACILITY): Payer: Medicare Other | Admitting: Adult Health

## 2021-04-16 DIAGNOSIS — I480 Paroxysmal atrial fibrillation: Secondary | ICD-10-CM

## 2021-04-16 DIAGNOSIS — K5901 Slow transit constipation: Secondary | ICD-10-CM | POA: Diagnosis not present

## 2021-04-16 DIAGNOSIS — H16123 Filamentary keratitis, bilateral: Secondary | ICD-10-CM

## 2021-04-16 DIAGNOSIS — F0281 Dementia in other diseases classified elsewhere with behavioral disturbance: Secondary | ICD-10-CM | POA: Diagnosis not present

## 2021-04-16 DIAGNOSIS — D638 Anemia in other chronic diseases classified elsewhere: Secondary | ICD-10-CM

## 2021-04-16 DIAGNOSIS — D692 Other nonthrombocytopenic purpura: Secondary | ICD-10-CM

## 2021-04-16 DIAGNOSIS — G301 Alzheimer's disease with late onset: Secondary | ICD-10-CM

## 2021-04-16 DIAGNOSIS — E871 Hypo-osmolality and hyponatremia: Secondary | ICD-10-CM

## 2021-04-16 NOTE — Progress Notes (Signed)
Location:    [place] Room Number: 107 Place of Service:  SNF [phone]) Provider:  Royal Hawthorn, NP  Virgie Dad, MD  Patient Care Team: Virgie Dad, MD as PCP - General (Internal Medicine) Community, Well [name], MD as Consulting Physician (Gastroenterology) [name], [name], MD as Consulting Physician (Dermatology) [name], MD as Consulting Physician (Ophthalmology) Royal Hawthorn, NP as Nurse Practitioner (Nurse Practitioner)  Extended Emergency Contact Information Primary Emergency Contact: [facility] Address: [address]          [place], [place] [address] [name] of [place] Phone: [phone] Work Phone: [phone] Mobile Phone: [phone] Relation: Spouse  Code Status:  DNR Hospice Goals of care: Advanced Directive information Advanced Directives 04/16/2021  Does Patient Have a Medical Advance Directive? Yes  Type of Paramedic of [place];Living will;Out of facility DNR (pink MOST or yellow form)  Does patient want to make changes to medical advance directive? No - Patient declined  Copy of [place] in Chart? Yes - validated most recent copy scanned in chart (See row information)  Would patient like information on creating a medical advance directive? -  Pre-existing out of facility DNR order (yellow form or pink MOST form) Yellow form placed in chart (order not valid for inpatient use);Pink MOST form placed in chart (order not valid for inpatient use)     Chief Complaint  Patient presents with  . Medical Management of Chronic Issues    HPI:  Pt is a 87 y.o. female seen today for medical management of chronic diseases.   PMH significant for AD, afib, UTI, hyponatremia, filamentary keratitis, depression, among others. She is followed by hospice for dementia. She resides in skilled care and is non ambulatory and needs  assistance with all ADLs. In discussion with the nurse their are no acute concerns at this time. She tends to resist personal care at times but is otherwise pleasant. She has purpura and has a small hematoma to the right lower extremity which is open and but healing. Continues on a regular diet, tolerating well.    Past Medical History:  Diagnosis Date  . Actinic keratoses   . Allergy   . Alzheimer disease ([place]) 05/21/2015  . Anxiety   . Arrhythmia    atrial fib  . Atrial fibrillation ([place]) 12/2012  . Basal cell carcinoma of skin   . Cataracts, both eyes 2005, 2013   Cataract extraction/IOL implant  . Current use of long term anticoagulation 12/2012   Eliquis  . Depression   . Diverticulosis 2008  . 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 Fracture of scaphoid bone of hand 08/05/2004  . History of UTI   . Hyperlipidemia   . Hypertension   . Hyponatremia    SIADH  . Low sodium levels   . Mild cognitive impairment   . Osteopenia   . Rosacea   . Squamous cell carcinoma   . Thyroid nodule    Past Surgical History:  Procedure Laterality Date  . ABDOMINAL HYSTERECTOMY  1970   TAH-BSO  . cataracts Bilateral 2/05 and 3/05  . CESAREAN SECTION     questionable    Allergies  Allergen Reactions  . Aricept [Donepezil]   . Solifenacin Other (See Comments)  . Sulfa Antibiotics   . Terbinafine And Related   . Ciprofloxacin Other (See Comments)    Reaction unknown  . Latex Other (See Comments)    Reaction unknown  .  Macrobid [Nitrofurantoin Macrocrystal] Nausea And Vomiting  . Macrodantin [Nitrofurantoin] Nausea Only    Upset stomach  . Other Other (See Comments)    Popcorn- Diverticulosis  . Strawberry Extract Other (See Comments)    Diverticulosis   . Tramadol Nausea Only    Allergies as of 04/16/2021      Reactions   Aricept [donepezil]    Solifenacin Other (See Comments)   Sulfa Antibiotics    Terbinafine And Related    Ciprofloxacin Other (See Comments)   Reaction  unknown   Latex Other (See Comments)   Reaction unknown   Macrobid [nitrofurantoin Macrocrystal] Nausea And Vomiting   Macrodantin [nitrofurantoin] Nausea Only   Upset stomach   Other Other (See Comments)   Popcorn- Diverticulosis   Strawberry Extract Other (See Comments)   Diverticulosis    Tramadol Nausea Only      Medication List       Accurate as of Apr 16, 2021  2:17 PM. If you have any questions, ask your nurse or doctor.        bisacodyl 10 MG suppository Commonly known as: DULCOLAX Place 10 mg rectally as needed for moderate constipation.   divalproex 125 MG capsule Commonly known as: DEPAKOTE SPRINKLE Take 250 mg by mouth in the morning.   lactose free nutrition Liqd Take 237 mLs by mouth daily.   magnesium hydroxide 400 MG/5ML suspension Commonly known as: MILK OF MAGNESIA Take 20 mLs by mouth daily as needed for mild constipation.   melatonin 3 MG Tabs tablet Take 3 mg by mouth at bedtime as needed.   mineral oil-hydrophilic petrolatum ointment Apply 1 application topically 2 (two) times daily. And at bedtime to left lower eye area   mirtazapine 15 MG tablet Commonly known as: REMERON Take 15 mg at bedtime by mouth.   ondansetron 4 MG tablet Commonly known as: ZOFRAN Take 4 mg by mouth every 6 (six) hours as needed for nausea or vomiting.   polyethylene glycol 17 g packet Commonly known as: MIRALAX / GLYCOLAX 2 (two) times daily. Full cap full each morning and 1/2 cap full each evening.   tobramycin 0.3 % ophthalmic solution Commonly known as: TOBREX Place 1 drop into the left eye in the morning, at noon, in the evening, and at bedtime.   trimethoprim 100 MG tablet Commonly known as: TRIMPEX Take 100 mg by mouth daily.   UNABLE TO FIND Place 1 drop into both eyes in the morning and at bedtime. Preservative tears       Review of Systems  Unable to perform ROS: Dementia    Immunization History  Administered Date(s) Administered  . H1N1  12/04/2008  . Influenza, High Dose Seasonal PF 09/19/2019  . Influenza,inj,Quad PF,6+ Mos 09/11/2018  . Influenza-Unspecified 09/15/2003, 11/01/2010, 09/12/2011, 07/22/2016, 09/11/2017, 07/23/2020  . Moderna SARS-COV2 Booster Vaccination 09/30/2020  . Moderna Sars-Covid-2 Vaccination 11/26/2019, 12/24/2019  . Pneumococcal Conjugate-13 04/14/2017  . Pneumococcal Polysaccharide-23 12/19/2008  . Td 09/15/2003, 01/06/2016  . Zoster Recombinat (Shingrix) 08/21/2017, 11/21/2017   Pertinent  Health Maintenance Due  Topic Date Due  . INFLUENZA VACCINE  06/21/2021  . DEXA SCAN  Completed  . PNA vac Low Risk Adult  Completed   Fall Risk  08/15/2018 07/12/2017 06/21/2017 05/04/2017 03/08/2017  Falls in the past year? No No No No No   Functional Status Survey:    Vitals:   04/16/21 1416  Weight: 110 lb (49.9 kg)   Body mass index is 19.49 kg/m. Physical Exam Vitals and  nursing note reviewed.  Constitutional:      General: She is not in acute distress.    Appearance: She is not diaphoretic.  HENT:     Head: Normocephalic and atraumatic.  Neck:     Vascular: No JVD.  Cardiovascular:     Rate and Rhythm: Normal rate. Rhythm irregular.     Heart sounds: No murmur heard.   Pulmonary:     Effort: Pulmonary effort is normal. No respiratory distress.     Breath sounds: Normal breath sounds. No wheezing.  Musculoskeletal:     Comments: Trace BLE edema.   Skin:    General: Skin is warm and dry.     Findings: Bruising (BLE, RLE with open area covered by polymem. Surrounding bruising, no purulent drainage or erythema) present.  Neurological:     Mental Status: She is alert. Mental status is at baseline.     Comments: Alert and able to answer questions      Labs reviewed: Recent Labs    05/11/20 0000 02/11/21 0000  NA 139 141  K 4.5 4.5  CL 106 108  CO2 22 28*  BUN 24* 26*  CREATININE 0.9 0.9  CALCIUM 9.7 8.4*   No results for input(s): AST, ALT, ALKPHOS, BILITOT, PROT, ALBUMIN  in the last 8760 hours. Recent Labs    05/11/20 0000 02/11/21 0000  WBC 5.2 5.3  HGB 11.8* 10.6*  HCT 36 32*  PLT 203 169   Lab Results  Component Value Date   TSH 2.29 10/18/2018   No results found for: HGBA1C No results found for: CHOL, HDL, LDLCALC, LDLDIRECT, TRIG, CHOLHDL  Significant Diagnostic Results in last 30 days:  No results found.  Assessment/Plan  1. Late onset Alzheimer's disease with behavioral disturbance ([place]) Severe stage, followed by hospice Will continue depakote to help with morning care Periodic lab work is recommended. Labs drawn in March reviewed with no significant changes.   2. Paroxysmal atrial fibrillation (HCC) Rate is controlled No longer on metoprolol for rate control No longer on DOAC for CVA risk reduction due to goals of care  3. Senile purpura ([place]) Noted to BLE   4. Hyponatremia No longer on sodium tabs NA 141 02/11/21  5. Filamentary keratitis of both eyes No current complaints.  Continues on Tobrex and lubricating gtts  6. Slow transit constipation Controlled Continue miralax 1/2 cap bid  7. Anemia of chronic disease  Mild, normocytic normochromic Lab Results  Component Value Date   HGB 10.6 (A) 02/11/2021      Labs/tests ordered:  NA

## 2021-04-18 DIAGNOSIS — D638 Anemia in other chronic diseases classified elsewhere: Secondary | ICD-10-CM | POA: Insufficient documentation

## 2021-04-20 DIAGNOSIS — G309 Alzheimer's disease, unspecified: Secondary | ICD-10-CM | POA: Diagnosis not present

## 2021-04-20 DIAGNOSIS — I4891 Unspecified atrial fibrillation: Secondary | ICD-10-CM | POA: Diagnosis not present

## 2021-04-20 DIAGNOSIS — E222 Syndrome of inappropriate secretion of antidiuretic hormone: Secondary | ICD-10-CM | POA: Diagnosis not present

## 2021-04-20 DIAGNOSIS — R634 Abnormal weight loss: Secondary | ICD-10-CM | POA: Diagnosis not present

## 2021-04-20 DIAGNOSIS — Z87440 Personal history of urinary (tract) infections: Secondary | ICD-10-CM | POA: Diagnosis not present

## 2021-04-20 DIAGNOSIS — F0281 Dementia in other diseases classified elsewhere with behavioral disturbance: Secondary | ICD-10-CM | POA: Diagnosis not present

## 2021-04-21 DIAGNOSIS — I4891 Unspecified atrial fibrillation: Secondary | ICD-10-CM | POA: Diagnosis not present

## 2021-04-21 DIAGNOSIS — E222 Syndrome of inappropriate secretion of antidiuretic hormone: Secondary | ICD-10-CM | POA: Diagnosis not present

## 2021-04-21 DIAGNOSIS — R634 Abnormal weight loss: Secondary | ICD-10-CM | POA: Diagnosis not present

## 2021-04-21 DIAGNOSIS — N183 Chronic kidney disease, stage 3 unspecified: Secondary | ICD-10-CM | POA: Diagnosis not present

## 2021-04-21 DIAGNOSIS — R159 Full incontinence of feces: Secondary | ICD-10-CM | POA: Diagnosis not present

## 2021-04-21 DIAGNOSIS — F418 Other specified anxiety disorders: Secondary | ICD-10-CM | POA: Diagnosis not present

## 2021-04-21 DIAGNOSIS — R32 Unspecified urinary incontinence: Secondary | ICD-10-CM | POA: Diagnosis not present

## 2021-04-21 DIAGNOSIS — H04123 Dry eye syndrome of bilateral lacrimal glands: Secondary | ICD-10-CM | POA: Diagnosis not present

## 2021-04-21 DIAGNOSIS — Z681 Body mass index (BMI) 19 or less, adult: Secondary | ICD-10-CM | POA: Diagnosis not present

## 2021-04-21 DIAGNOSIS — F0281 Dementia in other diseases classified elsewhere with behavioral disturbance: Secondary | ICD-10-CM | POA: Diagnosis not present

## 2021-04-21 DIAGNOSIS — R55 Syncope and collapse: Secondary | ICD-10-CM | POA: Diagnosis not present

## 2021-04-21 DIAGNOSIS — G309 Alzheimer's disease, unspecified: Secondary | ICD-10-CM | POA: Diagnosis not present

## 2021-04-21 DIAGNOSIS — D631 Anemia in chronic kidney disease: Secondary | ICD-10-CM | POA: Diagnosis not present

## 2021-04-21 DIAGNOSIS — Z87440 Personal history of urinary (tract) infections: Secondary | ICD-10-CM | POA: Diagnosis not present

## 2021-04-21 DIAGNOSIS — L719 Rosacea, unspecified: Secondary | ICD-10-CM | POA: Diagnosis not present

## 2021-04-21 DIAGNOSIS — Z7401 Bed confinement status: Secondary | ICD-10-CM | POA: Diagnosis not present

## 2021-04-21 DIAGNOSIS — Z741 Need for assistance with personal care: Secondary | ICD-10-CM | POA: Diagnosis not present

## 2021-04-29 DIAGNOSIS — Z87440 Personal history of urinary (tract) infections: Secondary | ICD-10-CM | POA: Diagnosis not present

## 2021-04-29 DIAGNOSIS — E222 Syndrome of inappropriate secretion of antidiuretic hormone: Secondary | ICD-10-CM | POA: Diagnosis not present

## 2021-04-29 DIAGNOSIS — F0281 Dementia in other diseases classified elsewhere with behavioral disturbance: Secondary | ICD-10-CM | POA: Diagnosis not present

## 2021-04-29 DIAGNOSIS — I4891 Unspecified atrial fibrillation: Secondary | ICD-10-CM | POA: Diagnosis not present

## 2021-04-29 DIAGNOSIS — G309 Alzheimer's disease, unspecified: Secondary | ICD-10-CM | POA: Diagnosis not present

## 2021-04-29 DIAGNOSIS — R634 Abnormal weight loss: Secondary | ICD-10-CM | POA: Diagnosis not present

## 2021-04-30 DIAGNOSIS — Z87440 Personal history of urinary (tract) infections: Secondary | ICD-10-CM | POA: Diagnosis not present

## 2021-04-30 DIAGNOSIS — E222 Syndrome of inappropriate secretion of antidiuretic hormone: Secondary | ICD-10-CM | POA: Diagnosis not present

## 2021-04-30 DIAGNOSIS — I4891 Unspecified atrial fibrillation: Secondary | ICD-10-CM | POA: Diagnosis not present

## 2021-04-30 DIAGNOSIS — R634 Abnormal weight loss: Secondary | ICD-10-CM | POA: Diagnosis not present

## 2021-04-30 DIAGNOSIS — F0281 Dementia in other diseases classified elsewhere with behavioral disturbance: Secondary | ICD-10-CM | POA: Diagnosis not present

## 2021-04-30 DIAGNOSIS — G309 Alzheimer's disease, unspecified: Secondary | ICD-10-CM | POA: Diagnosis not present

## 2021-05-03 DIAGNOSIS — I4891 Unspecified atrial fibrillation: Secondary | ICD-10-CM | POA: Diagnosis not present

## 2021-05-03 DIAGNOSIS — E222 Syndrome of inappropriate secretion of antidiuretic hormone: Secondary | ICD-10-CM | POA: Diagnosis not present

## 2021-05-03 DIAGNOSIS — G309 Alzheimer's disease, unspecified: Secondary | ICD-10-CM | POA: Diagnosis not present

## 2021-05-03 DIAGNOSIS — R634 Abnormal weight loss: Secondary | ICD-10-CM | POA: Diagnosis not present

## 2021-05-03 DIAGNOSIS — Z87440 Personal history of urinary (tract) infections: Secondary | ICD-10-CM | POA: Diagnosis not present

## 2021-05-03 DIAGNOSIS — F0281 Dementia in other diseases classified elsewhere with behavioral disturbance: Secondary | ICD-10-CM | POA: Diagnosis not present

## 2021-05-07 DIAGNOSIS — Z87440 Personal history of urinary (tract) infections: Secondary | ICD-10-CM | POA: Diagnosis not present

## 2021-05-07 DIAGNOSIS — I4891 Unspecified atrial fibrillation: Secondary | ICD-10-CM | POA: Diagnosis not present

## 2021-05-07 DIAGNOSIS — E222 Syndrome of inappropriate secretion of antidiuretic hormone: Secondary | ICD-10-CM | POA: Diagnosis not present

## 2021-05-07 DIAGNOSIS — R634 Abnormal weight loss: Secondary | ICD-10-CM | POA: Diagnosis not present

## 2021-05-07 DIAGNOSIS — F0281 Dementia in other diseases classified elsewhere with behavioral disturbance: Secondary | ICD-10-CM | POA: Diagnosis not present

## 2021-05-07 DIAGNOSIS — G309 Alzheimer's disease, unspecified: Secondary | ICD-10-CM | POA: Diagnosis not present

## 2021-05-11 DIAGNOSIS — I4891 Unspecified atrial fibrillation: Secondary | ICD-10-CM | POA: Diagnosis not present

## 2021-05-11 DIAGNOSIS — F0281 Dementia in other diseases classified elsewhere with behavioral disturbance: Secondary | ICD-10-CM | POA: Diagnosis not present

## 2021-05-11 DIAGNOSIS — E222 Syndrome of inappropriate secretion of antidiuretic hormone: Secondary | ICD-10-CM | POA: Diagnosis not present

## 2021-05-11 DIAGNOSIS — R634 Abnormal weight loss: Secondary | ICD-10-CM | POA: Diagnosis not present

## 2021-05-11 DIAGNOSIS — G309 Alzheimer's disease, unspecified: Secondary | ICD-10-CM | POA: Diagnosis not present

## 2021-05-11 DIAGNOSIS — Z87440 Personal history of urinary (tract) infections: Secondary | ICD-10-CM | POA: Diagnosis not present

## 2021-05-16 DIAGNOSIS — E222 Syndrome of inappropriate secretion of antidiuretic hormone: Secondary | ICD-10-CM | POA: Diagnosis not present

## 2021-05-16 DIAGNOSIS — R634 Abnormal weight loss: Secondary | ICD-10-CM | POA: Diagnosis not present

## 2021-05-16 DIAGNOSIS — F0281 Dementia in other diseases classified elsewhere with behavioral disturbance: Secondary | ICD-10-CM | POA: Diagnosis not present

## 2021-05-16 DIAGNOSIS — Z87440 Personal history of urinary (tract) infections: Secondary | ICD-10-CM | POA: Diagnosis not present

## 2021-05-16 DIAGNOSIS — I4891 Unspecified atrial fibrillation: Secondary | ICD-10-CM | POA: Diagnosis not present

## 2021-05-16 DIAGNOSIS — G309 Alzheimer's disease, unspecified: Secondary | ICD-10-CM | POA: Diagnosis not present

## 2021-05-18 ENCOUNTER — Non-Acute Institutional Stay (SKILLED_NURSING_FACILITY): Payer: Medicare Other | Admitting: Orthopedic Surgery

## 2021-05-18 ENCOUNTER — Encounter: Payer: Self-pay | Admitting: Orthopedic Surgery

## 2021-05-18 DIAGNOSIS — E871 Hypo-osmolality and hyponatremia: Secondary | ICD-10-CM | POA: Diagnosis not present

## 2021-05-18 DIAGNOSIS — H16123 Filamentary keratitis, bilateral: Secondary | ICD-10-CM | POA: Diagnosis not present

## 2021-05-18 DIAGNOSIS — F0281 Dementia in other diseases classified elsewhere with behavioral disturbance: Secondary | ICD-10-CM

## 2021-05-18 DIAGNOSIS — K5901 Slow transit constipation: Secondary | ICD-10-CM | POA: Diagnosis not present

## 2021-05-18 DIAGNOSIS — D692 Other nonthrombocytopenic purpura: Secondary | ICD-10-CM

## 2021-05-18 DIAGNOSIS — I480 Paroxysmal atrial fibrillation: Secondary | ICD-10-CM

## 2021-05-18 DIAGNOSIS — G301 Alzheimer's disease with late onset: Secondary | ICD-10-CM

## 2021-05-18 NOTE — Progress Notes (Signed)
Location:  [place] Room Number: 107-A Place of Service:  SNF (31) Provider:  Yvonna Alanis, NP   Patient Care Team: Virgie Dad, MD as PCP - General (Internal Medicine) Community, Well [name], MD as Consulting Physician (Gastroenterology) [name], Hassie Mandt, MD as Consulting Physician (Dermatology) [name], MD as Consulting Physician (Ophthalmology) Royal Hawthorn, NP as Nurse Practitioner (Nurse Practitioner)  Extended Emergency Contact Information Primary Emergency Contact: [facility] Address: [address]          [place], [place] [address] [name] of [place] Phone: [phone] Work Phone: [phone] Mobile Phone: [phone] Relation: Spouse  Code Status:  DNR Goals of care: Advanced Directive information Advanced Directives 05/18/2021  Does Patient Have a Medical Advance Directive? Yes  Type of Paramedic of [place];Living will;Out of facility DNR (pink MOST or yellow form)  Does patient want to make changes to medical advance directive? No - Patient declined  Copy of [place] in Chart? Yes - validated most recent copy scanned in chart (See row information)  Would patient like information on creating a medical advance directive? -  Pre-existing out of facility DNR order (yellow form or pink MOST form) Yellow form placed in chart (order not valid for inpatient use);Pink MOST form placed in chart (order not valid for inpatient use)     Chief Complaint  Patient presents with   Medical Management of Chronic Issues    Routine follow-up and discuss need for 4th covid vaccine or exclude    HPI:  Pt is a 87 y.o. female seen today for medical management of chronic diseases.    She currently resides on the skilled nursing unit at [facility] due to alzheimer's dementia. Past medical history includes: atrial fibrillation, hypertension,  senile purpura, diverticulosis, anemia, depression hyponatremia and constipation.   Followed by hospice due to Alzheimer's. No recent behavioral outbursts unless with nursing care. Family provides sitter to be with patient. Non ambulatory, requires assistance for most ADLs. No recent falls or injuries.   Eating about 25-50% of food. Remains on regular diet with thin liquids. No recent aspirations.   Small hematoma to right lower extremity slow healing. PolyMem dressing applied, changes scheduled on bath days (wed/sat).   Recent weights:  06/01- 112.6 lbs  05/02- 110 lbs  04/01- 108.4 lbs  Nurse does nor report any concerns.      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   Fracture of scaphoid bone of hand 08/05/2004   History of UTI    Hyperlipidemia    Hypertension    Hyponatremia    SIADH   Low sodium levels    Mild cognitive impairment    Osteopenia    Rosacea    Squamous cell carcinoma    Thyroid nodule    Past Surgical History:  Procedure Laterality Date   ABDOMINAL HYSTERECTOMY  1970   TAH-BSO   cataracts Bilateral 2/05 and 3/05   CESAREAN SECTION     questionable  Allergies  Allergen Reactions   Aricept [Donepezil]    Solifenacin Other (See Comments)   Sulfa Antibiotics    Terbinafine And Related    Ciprofloxacin Other (See Comments)    Reaction unknown   Latex Other (See Comments)    Reaction unknown   Macrobid [Nitrofurantoin Macrocrystal] Nausea And Vomiting   Macrodantin [Nitrofurantoin] Nausea Only    Upset stomach   Other Other (See Comments)    Popcorn- Diverticulosis    Strawberry Extract Other (See Comments)    Diverticulosis    Tramadol Nausea Only    Outpatient Encounter Medications as of 05/18/2021  Medication Sig   bisacodyl (DULCOLAX) 10 MG suppository Place 10 mg rectally as needed for moderate constipation.   divalproex (DEPAKOTE SPRINKLE) 125 MG capsule Take 250 mg by mouth in the morning.   haloperidol (HALDOL) 0.5 MG tablet Take 0.5 mg by mouth 2 (two) times daily. For increased restlessness   hydrOXYzine (ATARAX/VISTARIL) 10 MG tablet Take 10 mg by mouth as needed for anxiety (for 30 days).   lactose free nutrition (BOOST) LIQD Take 237 mLs by mouth daily.   magnesium hydroxide (MILK OF MAGNESIA) 400 MG/5ML suspension Take 20 mLs by mouth daily as needed for mild constipation.   melatonin 3 MG TABS tablet Take 3 mg by mouth at bedtime as needed.   mineral oil-hydrophilic petrolatum (AQUAPHOR) ointment Apply 1 application topically 2 (two) times daily. And at bedtime to left lower eye area   mirtazapine (REMERON) 15 MG tablet Take 15 mg at bedtime by mouth.   ondansetron (ZOFRAN) 4 MG tablet Take 4 mg by mouth every 6 (six) hours as needed for nausea or vomiting.   Polyethyl Glyc-Propyl Glyc PF (SYSTANE PRESERVATIVE FREE) 0.4-0.3 % SOLN Apply 1 drop to eye 2 (two) times daily.   polyethylene glycol (MIRALAX / GLYCOLAX) packet 2 (two) times daily. Full cap full each morning and 1/2 cap full each evening.   tobramycin (TOBREX) 0.3 % ophthalmic solution Place 1 drop into the left eye in the morning, at noon, in the evening, and at bedtime.   trimethoprim (TRIMPEX) 100 MG tablet Take 100 mg by mouth daily.   [DISCONTINUED] UNABLE TO FIND Place 1 drop into both eyes in the morning and at bedtime. Preservative tears   No facility-administered encounter medications on file as of 05/18/2021.    Review of Systems  Unable to perform ROS: Dementia   Immunization History  Administered Date(s) Administered   H1N1 12/04/2008   Influenza, High Dose  Seasonal PF 09/19/2019   Influenza,inj,Quad PF,6+ Mos 09/11/2018   Influenza-Unspecified 09/15/2003, 11/01/2010, 09/12/2011, 07/22/2016, 09/11/2017, 07/23/2020   Moderna SARS-COV2 Booster Vaccination 09/30/2020   Moderna Sars-Covid-2 Vaccination 11/26/2019, 12/24/2019   Pneumococcal Conjugate-13 04/14/2017   Pneumococcal Polysaccharide-23 12/19/2008   Td 09/15/2003, 01/06/2016   Zoster Recombinat (Shingrix) 08/21/2017, 11/21/2017   Pertinent  Health Maintenance Due  Topic Date Due   INFLUENZA VACCINE  06/21/2021   DEXA SCAN  Completed   PNA vac Low Risk Adult  Completed   Fall Risk  08/15/2018 07/12/2017 06/21/2017 05/04/2017 03/08/2017  Falls in the past year? No No No No No   Functional Status Survey:    Vitals:   05/18/21 1047  BP: 117/73  Pulse: (!) 49  Resp: 17  Temp: (!) 97.3 F (36.3 C)  SpO2: 97%  Weight: 112 lb 9.6 oz (51.1 kg)  Height: 5\' 3"  (1.6 m)   Body mass index is 19.95 kg/m. Physical Exam Vitals reviewed.  Constitutional:  General: She is not in acute distress. HENT:     Head: Normocephalic.     Right Ear: There is no impacted cerumen.     Left Ear: There is no impacted cerumen.     Nose: Nose normal.     Mouth/Throat:     Lips: Lesions present.     Mouth: Mucous membranes are moist.     Comments: Lower lip with dried blood.  Eyes:     General:        Right eye: No discharge.        Left eye: No discharge.  Neck:     Vascular: No carotid bruit.  Cardiovascular:     Rate and Rhythm: Normal rate. Rhythm irregular.     Pulses: Normal pulses.     Heart sounds: Normal heart sounds. No murmur heard. Pulmonary:     Effort: Pulmonary effort is normal. No respiratory distress.     Breath sounds: Normal breath sounds. No wheezing.  Abdominal:     General: Bowel sounds are normal. There is no distension.     Palpations: Abdomen is soft.     Tenderness: There is no abdominal tenderness.  Musculoskeletal:     Cervical back: Normal range of motion.      Right lower leg: Edema present.     Left lower leg: Edema present.     Comments: Non-pitting  Lymphadenopathy:     Cervical: No cervical adenopathy.  Skin:    General: Skin is warm and dry.     Capillary Refill: Capillary refill takes less than 2 seconds.  Neurological:     General: No focal deficit present.     Mental Status: She is alert. Mental status is at baseline.     Motor: Weakness present.     Gait: Gait abnormal.     Comments: wheelchair  Psychiatric:        Mood and Affect: Mood normal.        Behavior: Behavior normal.        Cognition and Memory: Memory is impaired.    Labs reviewed: Recent Labs    02/11/21 0000  NA 141  K 4.5  CL 108  CO2 28*  BUN 26*  CREATININE 0.9  CALCIUM 8.4*   No results for input(s): AST, ALT, ALKPHOS, BILITOT, PROT, ALBUMIN in the last 8760 hours. Recent Labs    02/11/21 0000  WBC 5.3  HGB 10.6*  HCT 32*  PLT 169   Lab Results  Component Value Date   TSH 2.29 10/18/2018   No results found for: HGBA1C No results found for: CHOL, HDL, LDLCALC, LDLDIRECT, TRIG, CHOLHDL  Significant Diagnostic Results in last 30 days:  No results found.  Assessment/Plan 1. Late onset Alzheimer's disease with behavioral disturbance ([place]) - end stages- followed by hospice - cont sitter - cont depakote, haldol and prn vistaril to help with ADL compliance  2. Paroxysmal atrial fibrillation (HCC) - rate controlled without medication - DOAC discontinued due to goals of care  3. Senile purpura ([place]) - remains on BLE  4. Hyponatremia - Na 141 02/11/2021 - remains off of sodium tablets  5. Filamentary keratitis of both eyes - stable, cont Tobrex drops  6. Slow transit constipation - stable with miralax 1/2 dose bid     Family/ staff Communication: plan discussed with nurse  Labs/tests ordered:  none

## 2021-05-21 DIAGNOSIS — L719 Rosacea, unspecified: Secondary | ICD-10-CM | POA: Diagnosis not present

## 2021-05-21 DIAGNOSIS — Z7401 Bed confinement status: Secondary | ICD-10-CM | POA: Diagnosis not present

## 2021-05-21 DIAGNOSIS — F418 Other specified anxiety disorders: Secondary | ICD-10-CM | POA: Diagnosis not present

## 2021-05-21 DIAGNOSIS — N183 Chronic kidney disease, stage 3 unspecified: Secondary | ICD-10-CM | POA: Diagnosis not present

## 2021-05-21 DIAGNOSIS — R159 Full incontinence of feces: Secondary | ICD-10-CM | POA: Diagnosis not present

## 2021-05-21 DIAGNOSIS — R32 Unspecified urinary incontinence: Secondary | ICD-10-CM | POA: Diagnosis not present

## 2021-05-21 DIAGNOSIS — G309 Alzheimer's disease, unspecified: Secondary | ICD-10-CM | POA: Diagnosis not present

## 2021-05-21 DIAGNOSIS — R634 Abnormal weight loss: Secondary | ICD-10-CM | POA: Diagnosis not present

## 2021-05-21 DIAGNOSIS — F0281 Dementia in other diseases classified elsewhere with behavioral disturbance: Secondary | ICD-10-CM | POA: Diagnosis not present

## 2021-05-21 DIAGNOSIS — Z681 Body mass index (BMI) 19 or less, adult: Secondary | ICD-10-CM | POA: Diagnosis not present

## 2021-05-21 DIAGNOSIS — I4891 Unspecified atrial fibrillation: Secondary | ICD-10-CM | POA: Diagnosis not present

## 2021-05-21 DIAGNOSIS — Z87440 Personal history of urinary (tract) infections: Secondary | ICD-10-CM | POA: Diagnosis not present

## 2021-05-21 DIAGNOSIS — E222 Syndrome of inappropriate secretion of antidiuretic hormone: Secondary | ICD-10-CM | POA: Diagnosis not present

## 2021-05-21 DIAGNOSIS — R55 Syncope and collapse: Secondary | ICD-10-CM | POA: Diagnosis not present

## 2021-05-21 DIAGNOSIS — H04123 Dry eye syndrome of bilateral lacrimal glands: Secondary | ICD-10-CM | POA: Diagnosis not present

## 2021-05-21 DIAGNOSIS — D631 Anemia in chronic kidney disease: Secondary | ICD-10-CM | POA: Diagnosis not present

## 2021-05-21 DIAGNOSIS — Z741 Need for assistance with personal care: Secondary | ICD-10-CM | POA: Diagnosis not present

## 2021-05-25 DIAGNOSIS — I4891 Unspecified atrial fibrillation: Secondary | ICD-10-CM | POA: Diagnosis not present

## 2021-05-25 DIAGNOSIS — G309 Alzheimer's disease, unspecified: Secondary | ICD-10-CM | POA: Diagnosis not present

## 2021-05-25 DIAGNOSIS — R634 Abnormal weight loss: Secondary | ICD-10-CM | POA: Diagnosis not present

## 2021-05-25 DIAGNOSIS — Z87440 Personal history of urinary (tract) infections: Secondary | ICD-10-CM | POA: Diagnosis not present

## 2021-05-25 DIAGNOSIS — F0281 Dementia in other diseases classified elsewhere with behavioral disturbance: Secondary | ICD-10-CM | POA: Diagnosis not present

## 2021-05-25 DIAGNOSIS — E222 Syndrome of inappropriate secretion of antidiuretic hormone: Secondary | ICD-10-CM | POA: Diagnosis not present

## 2021-05-27 DIAGNOSIS — F0281 Dementia in other diseases classified elsewhere with behavioral disturbance: Secondary | ICD-10-CM | POA: Diagnosis not present

## 2021-05-27 DIAGNOSIS — Z87440 Personal history of urinary (tract) infections: Secondary | ICD-10-CM | POA: Diagnosis not present

## 2021-05-27 DIAGNOSIS — G309 Alzheimer's disease, unspecified: Secondary | ICD-10-CM | POA: Diagnosis not present

## 2021-05-27 DIAGNOSIS — E222 Syndrome of inappropriate secretion of antidiuretic hormone: Secondary | ICD-10-CM | POA: Diagnosis not present

## 2021-05-27 DIAGNOSIS — I4891 Unspecified atrial fibrillation: Secondary | ICD-10-CM | POA: Diagnosis not present

## 2021-05-27 DIAGNOSIS — R634 Abnormal weight loss: Secondary | ICD-10-CM | POA: Diagnosis not present

## 2021-05-29 DIAGNOSIS — G309 Alzheimer's disease, unspecified: Secondary | ICD-10-CM | POA: Diagnosis not present

## 2021-05-29 DIAGNOSIS — F0281 Dementia in other diseases classified elsewhere with behavioral disturbance: Secondary | ICD-10-CM | POA: Diagnosis not present

## 2021-05-29 DIAGNOSIS — R634 Abnormal weight loss: Secondary | ICD-10-CM | POA: Diagnosis not present

## 2021-05-29 DIAGNOSIS — E222 Syndrome of inappropriate secretion of antidiuretic hormone: Secondary | ICD-10-CM | POA: Diagnosis not present

## 2021-05-29 DIAGNOSIS — Z87440 Personal history of urinary (tract) infections: Secondary | ICD-10-CM | POA: Diagnosis not present

## 2021-05-29 DIAGNOSIS — I4891 Unspecified atrial fibrillation: Secondary | ICD-10-CM | POA: Diagnosis not present

## 2021-05-31 DIAGNOSIS — R634 Abnormal weight loss: Secondary | ICD-10-CM | POA: Diagnosis not present

## 2021-05-31 DIAGNOSIS — I4891 Unspecified atrial fibrillation: Secondary | ICD-10-CM | POA: Diagnosis not present

## 2021-05-31 DIAGNOSIS — Z87440 Personal history of urinary (tract) infections: Secondary | ICD-10-CM | POA: Diagnosis not present

## 2021-05-31 DIAGNOSIS — G309 Alzheimer's disease, unspecified: Secondary | ICD-10-CM | POA: Diagnosis not present

## 2021-05-31 DIAGNOSIS — E222 Syndrome of inappropriate secretion of antidiuretic hormone: Secondary | ICD-10-CM | POA: Diagnosis not present

## 2021-05-31 DIAGNOSIS — F0281 Dementia in other diseases classified elsewhere with behavioral disturbance: Secondary | ICD-10-CM | POA: Diagnosis not present

## 2021-06-01 DIAGNOSIS — E222 Syndrome of inappropriate secretion of antidiuretic hormone: Secondary | ICD-10-CM | POA: Diagnosis not present

## 2021-06-01 DIAGNOSIS — I4891 Unspecified atrial fibrillation: Secondary | ICD-10-CM | POA: Diagnosis not present

## 2021-06-01 DIAGNOSIS — G309 Alzheimer's disease, unspecified: Secondary | ICD-10-CM | POA: Diagnosis not present

## 2021-06-01 DIAGNOSIS — R634 Abnormal weight loss: Secondary | ICD-10-CM | POA: Diagnosis not present

## 2021-06-01 DIAGNOSIS — F0281 Dementia in other diseases classified elsewhere with behavioral disturbance: Secondary | ICD-10-CM | POA: Diagnosis not present

## 2021-06-01 DIAGNOSIS — Z87440 Personal history of urinary (tract) infections: Secondary | ICD-10-CM | POA: Diagnosis not present

## 2021-06-02 DIAGNOSIS — R634 Abnormal weight loss: Secondary | ICD-10-CM | POA: Diagnosis not present

## 2021-06-02 DIAGNOSIS — Z87440 Personal history of urinary (tract) infections: Secondary | ICD-10-CM | POA: Diagnosis not present

## 2021-06-02 DIAGNOSIS — F0281 Dementia in other diseases classified elsewhere with behavioral disturbance: Secondary | ICD-10-CM | POA: Diagnosis not present

## 2021-06-02 DIAGNOSIS — I4891 Unspecified atrial fibrillation: Secondary | ICD-10-CM | POA: Diagnosis not present

## 2021-06-02 DIAGNOSIS — E222 Syndrome of inappropriate secretion of antidiuretic hormone: Secondary | ICD-10-CM | POA: Diagnosis not present

## 2021-06-02 DIAGNOSIS — G309 Alzheimer's disease, unspecified: Secondary | ICD-10-CM | POA: Diagnosis not present

## 2021-06-03 DIAGNOSIS — Z87440 Personal history of urinary (tract) infections: Secondary | ICD-10-CM | POA: Diagnosis not present

## 2021-06-03 DIAGNOSIS — R634 Abnormal weight loss: Secondary | ICD-10-CM | POA: Diagnosis not present

## 2021-06-03 DIAGNOSIS — G309 Alzheimer's disease, unspecified: Secondary | ICD-10-CM | POA: Diagnosis not present

## 2021-06-03 DIAGNOSIS — I4891 Unspecified atrial fibrillation: Secondary | ICD-10-CM | POA: Diagnosis not present

## 2021-06-03 DIAGNOSIS — F0281 Dementia in other diseases classified elsewhere with behavioral disturbance: Secondary | ICD-10-CM | POA: Diagnosis not present

## 2021-06-03 DIAGNOSIS — E222 Syndrome of inappropriate secretion of antidiuretic hormone: Secondary | ICD-10-CM | POA: Diagnosis not present

## 2021-06-04 DIAGNOSIS — G309 Alzheimer's disease, unspecified: Secondary | ICD-10-CM | POA: Diagnosis not present

## 2021-06-04 DIAGNOSIS — Z87440 Personal history of urinary (tract) infections: Secondary | ICD-10-CM | POA: Diagnosis not present

## 2021-06-04 DIAGNOSIS — I4891 Unspecified atrial fibrillation: Secondary | ICD-10-CM | POA: Diagnosis not present

## 2021-06-04 DIAGNOSIS — R634 Abnormal weight loss: Secondary | ICD-10-CM | POA: Diagnosis not present

## 2021-06-04 DIAGNOSIS — E222 Syndrome of inappropriate secretion of antidiuretic hormone: Secondary | ICD-10-CM | POA: Diagnosis not present

## 2021-06-04 DIAGNOSIS — F0281 Dementia in other diseases classified elsewhere with behavioral disturbance: Secondary | ICD-10-CM | POA: Diagnosis not present

## 2021-06-07 DIAGNOSIS — I4891 Unspecified atrial fibrillation: Secondary | ICD-10-CM | POA: Diagnosis not present

## 2021-06-07 DIAGNOSIS — R634 Abnormal weight loss: Secondary | ICD-10-CM | POA: Diagnosis not present

## 2021-06-07 DIAGNOSIS — Z87440 Personal history of urinary (tract) infections: Secondary | ICD-10-CM | POA: Diagnosis not present

## 2021-06-07 DIAGNOSIS — G309 Alzheimer's disease, unspecified: Secondary | ICD-10-CM | POA: Diagnosis not present

## 2021-06-07 DIAGNOSIS — E222 Syndrome of inappropriate secretion of antidiuretic hormone: Secondary | ICD-10-CM | POA: Diagnosis not present

## 2021-06-07 DIAGNOSIS — F0281 Dementia in other diseases classified elsewhere with behavioral disturbance: Secondary | ICD-10-CM | POA: Diagnosis not present

## 2021-06-08 DIAGNOSIS — Z87440 Personal history of urinary (tract) infections: Secondary | ICD-10-CM | POA: Diagnosis not present

## 2021-06-08 DIAGNOSIS — R634 Abnormal weight loss: Secondary | ICD-10-CM | POA: Diagnosis not present

## 2021-06-08 DIAGNOSIS — E222 Syndrome of inappropriate secretion of antidiuretic hormone: Secondary | ICD-10-CM | POA: Diagnosis not present

## 2021-06-08 DIAGNOSIS — F0281 Dementia in other diseases classified elsewhere with behavioral disturbance: Secondary | ICD-10-CM | POA: Diagnosis not present

## 2021-06-08 DIAGNOSIS — I4891 Unspecified atrial fibrillation: Secondary | ICD-10-CM | POA: Diagnosis not present

## 2021-06-08 DIAGNOSIS — G309 Alzheimer's disease, unspecified: Secondary | ICD-10-CM | POA: Diagnosis not present

## 2021-06-09 DIAGNOSIS — G309 Alzheimer's disease, unspecified: Secondary | ICD-10-CM | POA: Diagnosis not present

## 2021-06-09 DIAGNOSIS — E222 Syndrome of inappropriate secretion of antidiuretic hormone: Secondary | ICD-10-CM | POA: Diagnosis not present

## 2021-06-09 DIAGNOSIS — Z87440 Personal history of urinary (tract) infections: Secondary | ICD-10-CM | POA: Diagnosis not present

## 2021-06-09 DIAGNOSIS — F0281 Dementia in other diseases classified elsewhere with behavioral disturbance: Secondary | ICD-10-CM | POA: Diagnosis not present

## 2021-06-09 DIAGNOSIS — R634 Abnormal weight loss: Secondary | ICD-10-CM | POA: Diagnosis not present

## 2021-06-09 DIAGNOSIS — I4891 Unspecified atrial fibrillation: Secondary | ICD-10-CM | POA: Diagnosis not present

## 2021-06-10 DIAGNOSIS — G309 Alzheimer's disease, unspecified: Secondary | ICD-10-CM | POA: Diagnosis not present

## 2021-06-10 DIAGNOSIS — E222 Syndrome of inappropriate secretion of antidiuretic hormone: Secondary | ICD-10-CM | POA: Diagnosis not present

## 2021-06-10 DIAGNOSIS — R634 Abnormal weight loss: Secondary | ICD-10-CM | POA: Diagnosis not present

## 2021-06-10 DIAGNOSIS — I4891 Unspecified atrial fibrillation: Secondary | ICD-10-CM | POA: Diagnosis not present

## 2021-06-10 DIAGNOSIS — Z87440 Personal history of urinary (tract) infections: Secondary | ICD-10-CM | POA: Diagnosis not present

## 2021-06-10 DIAGNOSIS — F0281 Dementia in other diseases classified elsewhere with behavioral disturbance: Secondary | ICD-10-CM | POA: Diagnosis not present

## 2021-06-21 DEATH — deceased
